# Patient Record
Sex: Female | Born: 1975 | Race: White | Hispanic: No | Smoking: Former smoker
Health system: Southern US, Community
[De-identification: ages and names within clinical notes are randomized; demographics above are authoritative.]

## PROBLEM LIST (undated history)

## (undated) DIAGNOSIS — S82899A Other fracture of unspecified lower leg, initial encounter for closed fracture: Secondary | ICD-10-CM

## (undated) DIAGNOSIS — R51 Headache: Secondary | ICD-10-CM

## (undated) DIAGNOSIS — N189 Chronic kidney disease, unspecified: Secondary | ICD-10-CM

## (undated) DIAGNOSIS — R87629 Unspecified abnormal cytological findings in specimens from vagina: Secondary | ICD-10-CM

## (undated) DIAGNOSIS — J302 Other seasonal allergic rhinitis: Secondary | ICD-10-CM

## (undated) DIAGNOSIS — F419 Anxiety disorder, unspecified: Secondary | ICD-10-CM

## (undated) DIAGNOSIS — F329 Major depressive disorder, single episode, unspecified: Secondary | ICD-10-CM

## (undated) DIAGNOSIS — K760 Fatty (change of) liver, not elsewhere classified: Secondary | ICD-10-CM

## (undated) DIAGNOSIS — M779 Enthesopathy, unspecified: Secondary | ICD-10-CM

## (undated) DIAGNOSIS — R519 Headache, unspecified: Secondary | ICD-10-CM

## (undated) DIAGNOSIS — F32A Depression, unspecified: Secondary | ICD-10-CM

## (undated) DIAGNOSIS — K259 Gastric ulcer, unspecified as acute or chronic, without hemorrhage or perforation: Secondary | ICD-10-CM

## (undated) HISTORY — DX: Other fracture of unspecified lower leg, initial encounter for closed fracture: S82.899A

## (undated) HISTORY — PX: LASER ABLATION/CAUTERIZATION OF ENDOMETRIAL IMPLANTS: SHX1951

## (undated) HISTORY — PX: OTHER SURGICAL HISTORY: SHX169

## (undated) HISTORY — DX: Gastric ulcer, unspecified as acute or chronic, without hemorrhage or perforation: K25.9

## (undated) HISTORY — DX: Other seasonal allergic rhinitis: J30.2

## (undated) HISTORY — PX: WISDOM TOOTH EXTRACTION: SHX21

## (undated) HISTORY — DX: Unspecified abnormal cytological findings in specimens from vagina: R87.629

## (undated) HISTORY — DX: Fatty (change of) liver, not elsewhere classified: K76.0

---

## 1998-06-06 ENCOUNTER — Emergency Department (HOSPITAL_COMMUNITY): Admission: EM | Admit: 1998-06-06 | Discharge: 1998-06-07 | Payer: Self-pay | Admitting: Internal Medicine

## 1998-08-27 ENCOUNTER — Emergency Department (HOSPITAL_COMMUNITY): Admission: EM | Admit: 1998-08-27 | Discharge: 1998-08-27 | Payer: Self-pay | Admitting: Emergency Medicine

## 1998-08-27 ENCOUNTER — Encounter: Payer: Self-pay | Admitting: *Deleted

## 1998-11-13 ENCOUNTER — Other Ambulatory Visit: Admission: RE | Admit: 1998-11-13 | Discharge: 1998-11-13 | Payer: Self-pay | Admitting: Obstetrics and Gynecology

## 1999-02-26 ENCOUNTER — Inpatient Hospital Stay (HOSPITAL_COMMUNITY): Admission: AD | Admit: 1999-02-26 | Discharge: 1999-02-26 | Payer: Self-pay | Admitting: Obstetrics and Gynecology

## 1999-03-25 ENCOUNTER — Inpatient Hospital Stay (HOSPITAL_COMMUNITY): Admission: AD | Admit: 1999-03-25 | Discharge: 1999-03-25 | Payer: Self-pay | Admitting: Obstetrics and Gynecology

## 1999-04-13 ENCOUNTER — Inpatient Hospital Stay (HOSPITAL_COMMUNITY): Admission: AD | Admit: 1999-04-13 | Discharge: 1999-04-13 | Payer: Self-pay | Admitting: Obstetrics and Gynecology

## 1999-04-13 ENCOUNTER — Encounter: Payer: Self-pay | Admitting: Obstetrics and Gynecology

## 2015-04-23 ENCOUNTER — Ambulatory Visit: Payer: BC Managed Care – PPO | Attending: Orthopedic Surgery | Admitting: Physical Therapy

## 2015-04-23 DIAGNOSIS — M25672 Stiffness of left ankle, not elsewhere classified: Secondary | ICD-10-CM | POA: Diagnosis present

## 2015-04-23 DIAGNOSIS — M25572 Pain in left ankle and joints of left foot: Secondary | ICD-10-CM | POA: Insufficient documentation

## 2015-04-23 NOTE — Therapy (Signed)
[facility] [place], [place], [address] Phone: [phone]   Fax:  [phone]  Physical Therapy Evaluation  Patient Details  Name: Courtney Joseph MRN: [id number] Date of Birth: 1975-11-22 Referring Provider: Wylene Simmer MD  Encounter Date: 04/23/2015      PT End of Session - 04/23/15 1223    Visit Number 1   Number of Visits 12   Date for PT Re-Evaluation 06/04/15   PT Start Time 5009   PT Stop Time 1214   PT Time Calculation (min) 58 min   Activity Tolerance Patient tolerated treatment well   Behavior During Therapy [facility] for tasks assessed/performed      No past medical history on file.  No past surgical history on file.  There were no vitals filed for this visit.  Visit Diagnosis:  Pain in left ankle and joints of left foot - Plan: PT plan of care cert/re-cert  Stiffness of left ankle, not elsewhere classified - Plan: PT plan of care cert/re-cert      Subjective Assessment - 04/23/15 1225    Subjective The pain wakes me at night.   Limitations Walking   How long can you walk comfortably? Short distances.   Patient Stated Goals Get out of pain and be able to workout again.   Currently in Pain? Yes   Pain Score 8    Pain Location Heel   Pain Orientation Left   Pain Descriptors / Indicators Aching;Throbbing   Pain Type Chronic pain;Acute pain   Pain Onset 1 to 4 weeks ago   Pain Frequency Constant   Aggravating Factors  Walking/running.   Pain Relieving Factors Ice pack.            [facility] PT Assessment - 04/23/15 0001    Assessment   Medical Diagnosis Left Achilles Tendonitis.   Referring Provider Wylene Simmer MD   Onset Date/Surgical Date --  One month.   Precautions   Precautions None   Restrictions   Weight Bearing Restrictions No   Balance Screen   Has the patient fallen in the past 6 months No   Has the patient had a decrease in activity level because of a fear of falling?  No   Is the  patient reluctant to leave their home because of a fear of falling?  No   Home Environment   Living Environment Private residence   Prior Function   Level of Independence Independent   Observation/Other Assessments-Edema    Edema --  Some malleolar swelling (mild).   ROM / Strength   AROM / PROM / Strength AROM;Strength   AROM   Overall AROM Comments Active left ankle dorsiflexion limited to 2 degrees with knee in full extension and 5 degrees with left knee flexion.   Strength   Overall Strength Comments Normal left foot and ankle strength.   Palpation   Palpation comment Tender to palpation over left calf from mid-calf to the calcaneal attachment of the left Achilles tendon diffusely over the rim of the calcaneus and tender to palpation on plantar aspect of foot (mid heel).   Ambulation/Gait   Gait Comments Antalgic gait with decrease stance time over patient's left heel.                   [facility] Adult PT Treatment/Exercise - 04/23/15 0001    Modalities   Modalities Electrical Stimulation;Iontophoresis   Electrical Stimulation   Electrical Stimulation Location Left heel (plantar surface) and distal calf/Achilles (left).  Electrical Stimulation Action Pre-mod constant   Electrical Stimulation Parameters 80-150 Hz.   Electrical Stimulation Goals Pain   Iontophoresis   Type of Iontophoresis Dexamethasone   Location Left affected Achilles.   Dose 80 mA-minutes   Time Patch                     PT Long Term Goals - 04/23/15 1237    PT LONG TERM GOAL #1   Title Ind with a HEP.   Time 4   Period Weeks   Status New   PT LONG TERM GOAL #2   Title Increase left ankle dorsiflexion to 8 degrees with left knee in full extension.   Time 4   Period Weeks   Status New   PT LONG TERM GOAL #3   Title Stand 20 minutes with pain not > 2/10.   Time 4   Period Weeks   Status New   PT LONG TERM GOAL #4   Title Sleep undistubed by pain.   Time 4   Period Weeks    Status New   PT LONG TERM GOAL #5   Title Perform ADL's with pain not > 2/10.   Time 4   Period Weeks   Status New               Plan - 04/23/15 1227    Clinical Impression Statement The patient reports she broke her foot in May of 2016 and was casted and then put in a CAM walker/boot.  She states she was doing well until about a month ago when she started feeling left calf and Achilles pain and now pain on the bottom of her left heel.  Her pain at times is so bad (8+/10) that it wakes her at night.  Pain increases with walking and  it makes perfoming ADL's more difficult.   Pt will benefit from skilled therapeutic intervention in order to improve on the following deficits Abnormal gait;Decreased activity tolerance;Pain;Decreased range of motion   Rehab Potential Excellent   PT Frequency 3x / week   PT Duration 4 weeks   PT Treatment/Interventions Electrical Stimulation;Cryotherapy;Moist Heat;Therapeutic exercise;Therapeutic activities;Ultrasound;Manual techniques;Passive range of motion   PT Next Visit Plan Iontophoresis (patient brought in her own prescription).  In prone:  STW/M..IASTM to patinet's left calf/Achilles and left heel.  Combo e'stim/U/S.   Consulted and Agree with Plan of Care Patient         Problem List There are no active problems to display for this patient.   APPLEGATE, [place] MPT 04/23/2015, 12:44 PM  [facility] [address] [place], [place], [address] Phone: [phone]   Fax:  [phone]  Name: Courtney Joseph MRN: [id number] Date of Birth: [birth year]

## 2015-04-25 ENCOUNTER — Ambulatory Visit: Payer: BC Managed Care – PPO | Admitting: *Deleted

## 2015-04-25 DIAGNOSIS — M25672 Stiffness of left ankle, not elsewhere classified: Secondary | ICD-10-CM

## 2015-04-25 DIAGNOSIS — M25572 Pain in left ankle and joints of left foot: Secondary | ICD-10-CM | POA: Diagnosis not present

## 2015-04-25 NOTE — Therapy (Signed)
[facility] [place], [place], [address] Phone: [phone]   Fax:  [phone]  Physical Therapy Treatment  Patient Details  Name: Courtney Joseph MRN: [id number] Date of Birth: 03-20-1975 Referring Provider: Wylene Simmer MD  Encounter Date: 04/25/2015      PT End of Session - 04/25/15 0904    Visit Number 2   Number of Visits 12   Date for PT Re-Evaluation 06/04/15   PT Start Time 0815   PT Stop Time 0908   PT Time Calculation (min) 53 min      No past medical history on file.  No past surgical history on file.  There were no vitals filed for this visit.      Subjective Assessment - 04/25/15 0857    Subjective The pain wakes me at night. Did good after last Rx   Limitations Walking   How long can you walk comfortably? Short distances.   Patient Stated Goals Get out of pain and be able to workout again.   Currently in Pain? Yes   Pain Score 7    Pain Location Heel   Pain Orientation Left   Pain Descriptors / Indicators Aching;Throbbing   Pain Type Chronic pain;Acute pain   Pain Onset 1 to 4 weeks ago   Pain Frequency Constant                         [facility] Adult PT Treatment/Exercise - 04/25/15 0001    Exercises   Exercises Ankle   Modalities   Modalities Electrical Stimulation;Iontophoresis;Moist Heat   Moist Heat Therapy   Number Minutes Moist Heat 15 Minutes   Moist Heat Location Ankle   Electrical Stimulation   Electrical Stimulation Location Left heel (plantar surface) and distal calf/Achilles (left). Premod x 15 mins 80-150hz    Electrical Stimulation Goals Pain   Iontophoresis   Type of Iontophoresis Dexamethasone   Location Left affected Achilles.   Dose 80 mA-minutes   Time Patch   Manual Therapy   Manual Therapy Soft tissue mobilization;Myofascial release   Myofascial Release STW/ IASTM to entire calf and into achilles while on/off stretch in prone   Ankle Exercises:  Standing   Rocker Board 3 minutes  calf stretching                     PT Long Term Goals - 04/23/15 1237    PT LONG TERM GOAL #1   Title Ind with a HEP.   Time 4   Period Weeks   Status New   PT LONG TERM GOAL #2   Title Increase left ankle dorsiflexion to 8 degrees with left knee in full extension.   Time 4   Period Weeks   Status New   PT LONG TERM GOAL #3   Title Stand 20 minutes with pain not > 2/10.   Time 4   Period Weeks   Status New   PT LONG TERM GOAL #4   Title Sleep undistubed by pain.   Time 4   Period Weeks   Status New   PT LONG TERM GOAL #5   Title Perform ADL's with pain not > 2/10.   Time 4   Period Weeks   Status New               Plan - 04/25/15 1243    Clinical Impression Statement Pt did fairly well with Rx today. She had notable tightness in  LT calf and into achilles tendon. She did fairly well with STW/IASTM with min to mod pressure. She also did well with light stretching on rocker board. Goals are ongoing   Rehab Potential Excellent   PT Frequency 3x / week   PT Duration 4 weeks   PT Treatment/Interventions Electrical Stimulation;Cryotherapy;Moist Heat;Therapeutic exercise;Therapeutic activities;Ultrasound;Manual techniques;Passive range of motion   PT Next Visit Plan Iontophoresis (patient brought in her own prescription).  In prone:  STW/M..IASTM to patinet's left calf/Achilles and left heel.  Combo e'stim/U/S.   Consulted and Agree with Plan of Care Patient      Patient will benefit from skilled therapeutic intervention in order to improve the following deficits and impairments:  Abnormal gait, Decreased activity tolerance, Pain, Decreased range of motion  Visit Diagnosis: Pain in left ankle and joints of left foot  Stiffness of left ankle, not elsewhere classified     Problem List There are no active problems to display for this patient.   Ivi Griffith,CHRIS, PTA 04/25/2015, 12:53 PM  [facility] [address] [place], [place], [address] Phone: [phone]   Fax:  [phone]  Name: Makalya Nave MRN: [id number] Date of Birth: February 26, 1975

## 2015-04-28 ENCOUNTER — Ambulatory Visit: Payer: BC Managed Care – PPO | Admitting: Physical Therapy

## 2015-04-28 DIAGNOSIS — M25572 Pain in left ankle and joints of left foot: Secondary | ICD-10-CM

## 2015-04-28 DIAGNOSIS — M25672 Stiffness of left ankle, not elsewhere classified: Secondary | ICD-10-CM

## 2015-04-28 NOTE — Therapy (Signed)
[facility] [place], [place], [address] Phone: [phone]   Fax:  [phone]  Physical Therapy Treatment  Patient Details  Name: Courtney Joseph MRN: [id number] Date of Birth: 1975-02-05 Referring Provider: Wylene Simmer MD  Encounter Date: 04/28/2015      PT End of Session - 04/28/15 0824    Visit Number 3   Number of Visits 12   Date for PT Re-Evaluation 06/04/15   PT Start Time 0818   PT Stop Time 0902   PT Time Calculation (min) 44 min   Activity Tolerance Patient tolerated treatment well   Behavior During Therapy [facility] for tasks assessed/performed      No past medical history on file.  No past surgical history on file.  There were no vitals filed for this visit.      Subjective Assessment - 04/28/15 0822    Subjective States that her L foot has been hurting her and kept for up last night. States that Achilles tendon discomfort is better although it is still tender.   Limitations Walking   How long can you walk comfortably? Short distances.   Patient Stated Goals Get out of pain and be able to workout again.   Currently in Pain? Yes   Pain Score 6    Pain Location Foot   Pain Orientation Left   Pain Descriptors / Indicators Nagging   Pain Type Chronic pain   Pain Onset 1 to 4 weeks ago            [facility] PT Assessment - 04/28/15 0001    Assessment   Medical Diagnosis Left Achilles Tendonitis.   Next MD Visit 05/18/2015   Precautions   Precautions None   Restrictions   Weight Bearing Restrictions No                     [facility] Adult PT Treatment/Exercise - 04/28/15 0001    Modalities   Modalities Electrical Stimulation;Iontophoresis;Moist Heat   Moist Heat Therapy   Number Minutes Moist Heat 15 Minutes   Moist Heat Location Ankle   Electrical Stimulation   Electrical Stimulation Location L plantar surface of foot, L distal Calf   Electrical Stimulation Action Pre-Mod   Electrical  Stimulation Parameters 80-150 Hz x15 min   Electrical Stimulation Goals Pain   Iontophoresis   Type of Iontophoresis Dexamethasone   Location Left affected Achilles.   Dose 80 mA-minutes   Time Patch   Manual Therapy   Manual Therapy Myofascial release   Myofascial Release IASTW to L Achilles, Gastroc in prone with intermittant calf stretching during IASTW   Ankle Exercises: Standing   Rocker Board Other (comment)  x6 min                     PT Long Term Goals - 04/28/15 0845    PT LONG TERM GOAL #1   Title Ind with a HEP.   Time 4   Period Weeks   Status Achieved   PT LONG TERM GOAL #2   Title Increase left ankle dorsiflexion to 8 degrees with left knee in full extension.   Time 4   Period Weeks   Status On-going   PT LONG TERM GOAL #3   Title Stand 20 minutes with pain not > 2/10.   Time 4   Period Weeks   Status On-going   PT LONG TERM GOAL #4   Title Sleep undistubed by pain.   Time  4   Period Weeks   Status On-going   PT LONG TERM GOAL #5   Title Perform ADL's with pain not > 2/10.   Time 4   Period Weeks   Status On-going               Plan - 04/28/15 0846    Clinical Impression Statement Patient tolerated today's treatment fairly well as she arrived with increased pain as she had increased pain last night and could hardly sleep. Patient only stands for 5/10 minutes at a time and has increased pain with walking per patient report. Tolerated IASTW to L calf and Achilles tendon fairly well as most discomfort was in mid Achilles to L heel. Achieved HEP goal that she had been told in previous treatment but all other goals are on-going secondary to only being third treatment. Normal modalities response noted following removal of the modalities. Iontophoresis patch was again placed over the L Achilles tendon and patient noted that L foot felt " good" following today's treatment. Paitent was encouraged to continue stretching that she was educated on in  previous treatment.   Rehab Potential Excellent   PT Frequency 3x / week   PT Duration 4 weeks   PT Treatment/Interventions Electrical Stimulation;Cryotherapy;Moist Heat;Therapeutic exercise;Therapeutic activities;Ultrasound;Manual techniques;Passive range of motion   PT Next Visit Plan IASTW to L Achilles and calf in prone with modalities and iontophoresis for pain per MPT POC.   Consulted and Agree with Plan of Care Patient      Patient will benefit from skilled therapeutic intervention in order to improve the following deficits and impairments:  Abnormal gait, Decreased activity tolerance, Pain, Decreased range of motion  Visit Diagnosis: Pain in left ankle and joints of left foot  Stiffness of left ankle, not elsewhere classified     Problem List There are no active problems to display for this patient.   Wynelle Fanny, PTA 04/28/2015, 9:11 AM  [facility] [address] [place], [place], [address] Phone: [phone]   Fax:  [phone]  Name: Courtney Joseph MRN: [id number] Date of Birth: Jul 11, 1975

## 2015-04-30 ENCOUNTER — Encounter: Payer: Self-pay | Admitting: Physical Therapy

## 2015-04-30 ENCOUNTER — Ambulatory Visit: Payer: BC Managed Care – PPO | Admitting: Physical Therapy

## 2015-04-30 DIAGNOSIS — M25572 Pain in left ankle and joints of left foot: Secondary | ICD-10-CM

## 2015-04-30 DIAGNOSIS — M25672 Stiffness of left ankle, not elsewhere classified: Secondary | ICD-10-CM

## 2015-04-30 NOTE — Therapy (Signed)
[facility] [place], [place], [address] Phone: [phone]   Fax:  [phone]  Physical Therapy Treatment  Patient Details  Name: Courtney Joseph MRN: [id number] Date of Birth: [birth year] Referring Provider: Wylene Simmer MD  Encounter Date: 04/30/2015      PT End of Session - 04/30/15 1347    Visit Number 4   Number of Visits 12   Date for PT Re-Evaluation 06/04/15   PT Start Time 1314   PT Stop Time 1400   PT Time Calculation (min) 46 min   Activity Tolerance Patient tolerated treatment well   Behavior During Therapy [facility] for tasks assessed/performed      History reviewed. No pertinent past medical history.  History reviewed. No pertinent past surgical history.  There were no vitals filed for this visit.      Subjective Assessment - 04/30/15 1318    Subjective No complaints after last treatment   Limitations Walking   How long can you walk comfortably? Short distances.   Patient Stated Goals Get out of pain and be able to workout again.   Currently in Pain? Yes   Pain Score 3    Pain Location --  achilles   Pain Orientation Left   Pain Descriptors / Indicators Aching;Nagging   Pain Type Chronic pain   Pain Onset 1 to 4 weeks ago   Pain Frequency Intermittent   Aggravating Factors  walking or standing   Pain Relieving Factors heat or ice                         [facility] Adult PT Treatment/Exercise - 04/30/15 0001    Moist Heat Therapy   Number Minutes Moist Heat 15 Minutes   Moist Heat Location Ankle   Electrical Stimulation   Electrical Stimulation Location L plantar surface of foot, L distal Calf   Electrical Stimulation Action premod   Electrical Stimulation Parameters 80-150hz    Electrical Stimulation Goals Pain   Iontophoresis   Type of Iontophoresis Dexamethasone   Location Left affected Achilles.   Dose 80 mA-minutes 4 of 6   Time Patch   Manual Therapy   Manual Therapy  Myofascial release   Myofascial Release IASTW to L Achilles, Gastroc in prone with intermittant calf stretching during IASTW   Ankle Exercises: Standing   Rocker Board --  74mn   Other Standing Ankle Exercises eccentic lowering on ground then 4" step 2x10                     PT Long Term Goals - 04/28/15 0845    PT LONG TERM GOAL #1   Title Ind with a HEP.   Time 4   Period Weeks   Status Achieved   PT LONG TERM GOAL #2   Title Increase left ankle dorsiflexion to 8 degrees with left knee in full extension.   Time 4   Period Weeks   Status On-going   PT LONG TERM GOAL #3   Title Stand 20 minutes with pain not > 2/10.   Time 4   Period Weeks   Status On-going   PT LONG TERM GOAL #4   Title Sleep undistubed by pain.   Time 4   Period Weeks   Status On-going   PT LONG TERM GOAL #5   Title Perform ADL's with pain not > 2/10.   Time 4   Period Weeks   Status On-going  Plan - 04/30/15 1349    Clinical Impression Statement Patient progressing with good response to treatment thus far. Patient has no complaints after treatments and feeling better overall. Patient unable to meet any further goals due to pain with prolong standing or walking.    Rehab Potential Excellent   PT Frequency 3x / week   PT Duration 4 weeks   PT Treatment/Interventions Electrical Stimulation;Cryotherapy;Moist Heat;Therapeutic exercise;Therapeutic activities;Ultrasound;Manual techniques;Passive range of motion   PT Next Visit Plan IASTW to L Achilles and calf in prone with modalities and iontophoresis for pain per MPT POC.   Consulted and Agree with Plan of Care Patient      Patient will benefit from skilled therapeutic intervention in order to improve the following deficits and impairments:  Abnormal gait, Decreased activity tolerance, Pain, Decreased range of motion  Visit Diagnosis: Pain in left ankle and joints of left foot  Stiffness of left ankle, not elsewhere  classified     Problem List There are no active problems to display for this patient.   Phillips Climes, PTA 04/30/2015, 2:01 PM  [facility] [place], [place], [address] Phone: [phone]   Fax:  [phone]  Name: Denee Boeder MRN: [id number] Date of Birth: 10-Apr-1975

## 2015-05-05 ENCOUNTER — Encounter: Payer: Self-pay | Admitting: Physical Therapy

## 2015-05-05 ENCOUNTER — Ambulatory Visit: Payer: BC Managed Care – PPO | Admitting: Physical Therapy

## 2015-05-05 DIAGNOSIS — M25672 Stiffness of left ankle, not elsewhere classified: Secondary | ICD-10-CM

## 2015-05-05 DIAGNOSIS — M25572 Pain in left ankle and joints of left foot: Secondary | ICD-10-CM | POA: Diagnosis not present

## 2015-05-05 NOTE — Therapy (Signed)
[facility] [place], [place], [address] Phone: [phone]   Fax:  [phone]  Physical Therapy Treatment  Patient Details  Name: Courtney Joseph MRN: [id number] Date of Birth: Oct 22, 1975 Referring Provider: Wylene Simmer MD  Encounter Date: 05/05/2015      PT End of Session - 05/05/15 1113    Visit Number 5   Number of Visits 12   Date for PT Re-Evaluation 06/04/15   PT Start Time 1030   PT Stop Time 1131   PT Time Calculation (min) 61 min   Activity Tolerance Patient tolerated treatment well   Behavior During Therapy [facility] for tasks assessed/performed      History reviewed. No pertinent past medical history.  History reviewed. No pertinent past surgical history.  There were no vitals filed for this visit.      Subjective Assessment - 05/05/15 1035    Subjective No complaints after last treatment, some stiffness after a lot of standing over weekend   Limitations Walking   How long can you walk comfortably? Short distances.   Patient Stated Goals Get out of pain and be able to workout again.   Currently in Pain? Yes   Pain Score 2    Pain Location --  achilles   Pain Orientation Left   Pain Descriptors / Indicators Aching;Nagging   Pain Type Chronic pain   Pain Onset 1 to 4 weeks ago   Pain Frequency Intermittent   Aggravating Factors  prolong standing or walking   Pain Relieving Factors heat and or ice                         [facility] Adult PT Treatment/Exercise - 05/05/15 0001    Modalities   Modalities Vasopneumatic;Ultrasound   Electrical Stimulation   Electrical Stimulation Location L plantar surface of foot, L distal Calf   Electrical Stimulation Action premod   Electrical Stimulation Parameters 80-150hz    Electrical Stimulation Goals Pain   Ultrasound   Ultrasound Location achilles   Ultrasound Parameters 1.5w/cm2/50%/3.56mzx8min   Ultrasound Goals Pain   Iontophoresis   Type of  Iontophoresis Dexamethasone   Location Left affected Achilles.   Dose 80 mA-minutes 5 of 6   Time Patch   Vasopneumatic   Number Minutes Vasopneumatic  15 minutes   Vasopnuematic Location  Ankle   Vasopneumatic Pressure Medium   Manual Therapy   Manual Therapy Myofascial release   Myofascial Release IASTW to L Achilles  in prone with intermittant calf stretching during IASTW   Ankle Exercises: Standing   Rocker Board --  557m   Other Standing Ankle Exercises eccentic lowering on ground then step 3x10                     PT Long Term Goals - 05/05/15 1050    PT LONG TERM GOAL #1   Title Ind with a HEP.   Time 4   Period Weeks   Status Achieved   PT LONG TERM GOAL #2   Title Increase left ankle dorsiflexion to 8 degrees with left knee in full extension.   Time 4   Period Weeks   Status On-going   PT LONG TERM GOAL #3   Title Stand 20 minutes with pain not > 2/10.   Time 4   Period Weeks   Status Achieved  2/10 05/05/15   PT LONG TERM GOAL #4   Title Sleep undistubed by pain.  Time 4   Period Weeks   Status Achieved  sleep with no pain 05/05/15   PT LONG TERM GOAL #5   Title Perform ADL's with pain not > 2/10.   Time 4   Period Weeks   Status On-going  2-3/10 05/05/15               Plan - 05/05/15 1056    Clinical Impression Statement Patient continues to progress with good response to therapy and feels less pain overall. Patient able to sleep undisturbed /no pain. patient has only 2/10 pain with standing 20 min. Patient has about 2-3/10 with ADL's. Patient able to meet LTG #3 and #4 today. Other goals ongoing due to pain and ROM deficits.   Rehab Potential Excellent   PT Frequency 3x / week   PT Duration 4 weeks   PT Treatment/Interventions Electrical Stimulation;Cryotherapy;Moist Heat;Therapeutic exercise;Therapeutic activities;Ultrasound;Manual techniques;Passive range of motion   PT Next Visit Plan IASTW to L Achilles and calf in prone with  modalities and iontophoresis for pain per MPT POC.   Consulted and Agree with Plan of Care Patient      Patient will benefit from skilled therapeutic intervention in order to improve the following deficits and impairments:  Abnormal gait, Decreased activity tolerance, Pain, Decreased range of motion  Visit Diagnosis: Pain in left ankle and joints of left foot  Stiffness of left ankle, not elsewhere classified     Problem List There are no active problems to display for this patient.   Phillips Climes, PTA 05/05/2015, 11:30 AM  [facility] [place], [place], [address] Phone: [phone]   Fax:  [phone]  Name: Courtney Joseph MRN: [id number] Date of Birth: [birth year]

## 2015-05-08 ENCOUNTER — Ambulatory Visit: Payer: BC Managed Care – PPO | Admitting: Physical Therapy

## 2015-05-08 DIAGNOSIS — M25672 Stiffness of left ankle, not elsewhere classified: Secondary | ICD-10-CM

## 2015-05-08 DIAGNOSIS — M25572 Pain in left ankle and joints of left foot: Secondary | ICD-10-CM | POA: Diagnosis not present

## 2015-05-08 NOTE — Therapy (Signed)
[facility] [place], [place], [address] Phone: [phone]   Fax:  [phone]  Physical Therapy Treatment  Patient Details  Name: Courtney Joseph MRN: [id number] Date of Birth: July 12, 1975 Referring Provider: Wylene Simmer MD  Encounter Date: 05/08/2015      PT End of Session - 05/08/15 1501    Visit Number 6   Number of Visits 12   Date for PT Re-Evaluation 06/04/15   PT Start Time 0150   PT Stop Time 0235   PT Time Calculation (min) 45 min   Activity Tolerance Patient tolerated treatment well   Behavior During Therapy [facility] for tasks assessed/performed      No past medical history on file.  No past surgical history on file.  There were no vitals filed for this visit.      Subjective Assessment - 05/08/15 1448    Subjective I'm sleeping better due to less pain.   Limitations Walking   How long can you walk comfortably? Short distances.   Patient Stated Goals Get out of pain and be able to workout again.   Pain Score 3    Pain Orientation Left   Pain Descriptors / Indicators Aching;Nagging                         [facility] Adult PT Treatment/Exercise - 05/08/15 0001    Ultrasound   Ultrasound Location In prone:  Left affected Achilles and plantar fascia   Ultrasound Parameters 1.50 W/CM2 at 3.3 MHz at 50% x 14 minutes.   Ultrasound Goals Pain   Manual Therapy   Manual Therapy Soft tissue mobilization   Myofascial Release IASTM/STW/M x 24 minutes.                     PT Long Term Goals - 05/05/15 1050    PT LONG TERM GOAL #1   Title Ind with a HEP.   Time 4   Period Weeks   Status Achieved   PT LONG TERM GOAL #2   Title Increase left ankle dorsiflexion to 8 degrees with left knee in full extension.   Time 4   Period Weeks   Status On-going   PT LONG TERM GOAL #3   Title Stand 20 minutes with pain not > 2/10.   Time 4   Period Weeks   Status Achieved  2/10 05/05/15   PT  LONG TERM GOAL #4   Title Sleep undistubed by pain.   Time 4   Period Weeks   Status Achieved  sleep with no pain 05/05/15   PT LONG TERM GOAL #5   Title Perform ADL's with pain not > 2/10.   Time 4   Period Weeks   Status On-going  2-3/10 05/05/15             Patient will benefit from skilled therapeutic intervention in order to improve the following deficits and impairments:     Visit Diagnosis: Pain in left ankle and joints of left foot  Stiffness of left ankle, not elsewhere classified     Problem List There are no active problems to display for this patient.   Ovie Cornelio, [place] MPT 05/08/2015, 3:16 PM  [facility] [address] [place], [place], [address] Phone: [phone]   Fax:  [phone]  Name: Courtney Joseph MRN: [id number] Date of Birth: 1975/10/30

## 2015-05-13 ENCOUNTER — Ambulatory Visit: Payer: BC Managed Care – PPO | Admitting: *Deleted

## 2015-05-13 DIAGNOSIS — M25572 Pain in left ankle and joints of left foot: Secondary | ICD-10-CM | POA: Diagnosis not present

## 2015-05-13 DIAGNOSIS — M25672 Stiffness of left ankle, not elsewhere classified: Secondary | ICD-10-CM

## 2015-05-13 NOTE — Therapy (Signed)
[facility] [place], [place], [address] Phone: [phone]   Fax:  [phone]  Physical Therapy Treatment  Patient Details  Name: Courtney Joseph MRN: [id number] Date of Birth: [birth year] Referring Provider: Wylene Simmer MD  Encounter Date: 05/13/2015      PT End of Session - 05/13/15 1824    Visit Number 7   Number of Visits 12   Date for PT Re-Evaluation 06/04/15   PT Start Time 1771   PT Stop Time 1607   PT Time Calculation (min) 52 min      No past medical history on file.  No past surgical history on file.  There were no vitals filed for this visit.      Subjective Assessment - 05/13/15 1826    Subjective I'm sleeping better due to less pain.  Note:  Mosquito bites on calf of leg. Heel hurt more yeterday   Limitations Walking   How long can you walk comfortably? Short distances.   Patient Stated Goals Get out of pain and be able to workout again.   Currently in Pain? Yes   Pain Score 4    Pain Location Heel   Pain Orientation Left   Pain Descriptors / Indicators Aching;Sore   Pain Type Chronic pain   Pain Onset 1 to 4 weeks ago   Pain Frequency Intermittent                         [facility] Adult PT Treatment/Exercise - 05/13/15 0001    Modalities   Modalities Vasopneumatic;Ultrasound   Moist Heat Therapy   Number Minutes Moist Heat 15 Minutes   Moist Heat Location Ankle   Electrical Stimulation   Electrical Stimulation Location L plantar surface of foot, L distal Calf premod x 15 mins 80-150 hz   Electrical Stimulation Goals Pain   Ultrasound   Ultrasound Location LT heel in prone    Ultrasound Parameters 1.5 w/cm2 x 10 mins at 50%   Ultrasound Goals Pain   Iontophoresis   Type of Iontophoresis Dexamethasone   Location Left affected Achilles.   Dose 80 mA-minutes 6of 6   Time Patch   Manual Therapy   Manual Therapy Soft tissue mobilization   Myofascial Release IASTW to L  Achilles, Gastroc in prone with intermittant calf stretching during IASTW and STW to platnar fascia                     PT Long Term Goals - 05/05/15 1050    PT LONG TERM GOAL #1   Title Ind with a HEP.   Time 4   Period Weeks   Status Achieved   PT LONG TERM GOAL #2   Title Increase left ankle dorsiflexion to 8 degrees with left knee in full extension.   Time 4   Period Weeks   Status On-going   PT LONG TERM GOAL #3   Title Stand 20 minutes with pain not > 2/10.   Time 4   Period Weeks   Status Achieved  2/10 05/05/15   PT LONG TERM GOAL #4   Title Sleep undistubed by pain.   Time 4   Period Weeks   Status Achieved  sleep with no pain 05/05/15   PT LONG TERM GOAL #5   Title Perform ADL's with pain not > 2/10.   Time 4   Period Weeks   Status On-going  2-3/10 05/05/15  Plan - 05/13/15 1831    Clinical Impression Statement Pt did fairly well with Rx today and was able to tolerate STW to achilles and calf with minimal pain/ soreness. She still had notable tightness in plantar fascia, but painb was still low. Pt continues to improve. She had increased heel pain yesterday, but not as bad today.  Goals are ongoing   Rehab Potential Excellent   PT Frequency 3x / week   PT Duration 4 weeks   PT Treatment/Interventions Electrical Stimulation;Cryotherapy;Moist Heat;Therapeutic exercise;Therapeutic activities;Ultrasound;Manual techniques;Passive range of motion   PT Next Visit Plan IASTW to L Achilles and calf in prone with modalities  for pain per MPT POC.   Consulted and Agree with Plan of Care Patient      Patient will benefit from skilled therapeutic intervention in order to improve the following deficits and impairments:  Abnormal gait, Decreased activity tolerance, Pain, Decreased range of motion  Visit Diagnosis: Pain in left ankle and joints of left foot  Stiffness of left ankle, not elsewhere classified     Problem List There are  no active problems to display for this patient.   Grant Henkes,CHRIS, PTA 05/13/2015, 6:38 PM  [facility] [address] [place], [place], [address] Phone: [phone]   Fax:  [phone]  Name: Cici Rodriges MRN: [id number] Date of Birth: 07-10-1975

## 2015-05-15 ENCOUNTER — Ambulatory Visit: Payer: BC Managed Care – PPO | Admitting: *Deleted

## 2015-05-15 DIAGNOSIS — M25572 Pain in left ankle and joints of left foot: Secondary | ICD-10-CM

## 2015-05-15 DIAGNOSIS — M25672 Stiffness of left ankle, not elsewhere classified: Secondary | ICD-10-CM

## 2015-05-15 NOTE — Therapy (Signed)
[facility] [place], [place], [address] Phone: [phone]   Fax:  [phone]  Physical Therapy Treatment  Patient Details  Name: Courtney Joseph MRN: [id number] Date of Birth: [birth year] Referring Provider: Wylene Simmer MD  Encounter Date: 05/15/2015      PT End of Session - 05/15/15 1516    Visit Number 8   Number of Visits 12   Date for PT Re-Evaluation 06/04/15   PT Start Time 1510   PT Stop Time 1600   PT Time Calculation (min) 50 min      No past medical history on file.  No past surgical history on file.  There were no vitals filed for this visit.      Subjective Assessment - 05/15/15 1511    Subjective LT foot is  feeling better 2/10 today on bottom of heel   Limitations Walking   How long can you walk comfortably? Short distances.   Patient Stated Goals Get out of pain and be able to workout again.   Currently in Pain? Yes   Pain Score 2    Pain Location Heel   Pain Orientation Left   Pain Descriptors / Indicators Aching;Sore   Pain Type Chronic pain   Pain Onset 1 to 4 weeks ago   Pain Frequency Intermittent                         [facility] Adult PT Treatment/Exercise - 05/15/15 0001    Modalities   Modalities Moist Heat;Ultrasound   Moist Heat Therapy   Number Minutes Moist Heat 15 Minutes   Moist Heat Location Ankle   Electrical Stimulation   Electrical Stimulation Location L plantar surface of foot, L distal Calf premod x 15 mins 80-150 hz   Electrical Stimulation Goals Pain   Ultrasound   Ultrasound Location LT heel bottom part   Ultrasound Parameters 1.5 w/cm2 x 10 mins   Ultrasound Goals Pain   Manual Therapy   Manual Therapy Soft tissue mobilization   Myofascial Release IASTW and STW to LT plantar fascia and around heel with Pt supine                     PT Long Term Goals - 05/05/15 1050    PT LONG TERM GOAL #1   Title Ind with a HEP.   Time 4   Period Weeks   Status Achieved   PT LONG TERM GOAL #2   Title Increase left ankle dorsiflexion to 8 degrees with left knee in full extension.   Time 4   Period Weeks   Status On-going   PT LONG TERM GOAL #3   Title Stand 20 minutes with pain not > 2/10.   Time 4   Period Weeks   Status Achieved  2/10 05/05/15   PT LONG TERM GOAL #4   Title Sleep undistubed by pain.   Time 4   Period Weeks   Status Achieved  sleep with no pain 05/05/15   PT LONG TERM GOAL #5   Title Perform ADL's with pain not > 2/10.   Time 4   Period Weeks   Status On-going  2-3/10 05/05/15               Plan - 05/15/15 1517    Clinical Impression Statement Pt did great today with Rx and had minimal foot pain 2/10 at heel. She did well with [place] and STW and  had the majority of soreness and tightnesss towards base of heel. She has met all LTGs except LTG for DF ROM which was 6 degrees today.   Rehab Potential Excellent   PT Frequency 3x / week   PT Duration 4 weeks   PT Treatment/Interventions Electrical Stimulation;Cryotherapy;Moist Heat;Therapeutic exercise;Therapeutic activities;Ultrasound;Manual techniques;Passive range of motion   PT Next Visit Plan IASTW to L Achilles and calf in prone with modalities  for pain per MPT POC.   Consulted and Agree with Plan of Care Patient      Patient will benefit from skilled therapeutic intervention in order to improve the following deficits and impairments:  Abnormal gait, Decreased activity tolerance, Pain, Decreased range of motion  Visit Diagnosis: Pain in left ankle and joints of left foot  Stiffness of left ankle, not elsewhere classified     Problem List There are no active problems to display for this patient.   RAMSEUR,CHRIS, PTA 05/15/2015, 4:20 PM  [facility] [address] [place], [place], [address] Phone: [phone]   Fax:  [phone]  Name: Courtney Joseph MRN: [id number] Date of Birth:  [birth year]

## 2015-05-20 ENCOUNTER — Ambulatory Visit: Payer: BC Managed Care – PPO | Attending: Orthopedic Surgery | Admitting: *Deleted

## 2015-05-20 DIAGNOSIS — M25672 Stiffness of left ankle, not elsewhere classified: Secondary | ICD-10-CM | POA: Diagnosis present

## 2015-05-20 DIAGNOSIS — M25572 Pain in left ankle and joints of left foot: Secondary | ICD-10-CM | POA: Diagnosis present

## 2015-05-20 NOTE — Therapy (Signed)
[facility] [place], [place], [address] Phone: [phone]   Fax:  [phone]  Physical Therapy Treatment  Patient Details  Name: Courtney Joseph MRN: [id number] Date of Birth: 1975-07-09 Referring Provider: Wylene Simmer MD  Encounter Date: 05/20/2015      PT End of Session - 05/20/15 1525    Visit Number 9   Number of Visits 12   Date for PT Re-Evaluation 06/04/15   PT Start Time 7619   PT Stop Time 1605   PT Time Calculation (min) 50 min      No past medical history on file.  No past surgical history on file.  There were no vitals filed for this visit.      Subjective Assessment - 05/20/15 1525    Subjective LT foot is  feeling better 2/10 today on bottom of heel   Limitations Walking   How long can you walk comfortably? Short distances.   Patient Stated Goals Get out of pain and be able to workout again.   Currently in Pain? Yes   Pain Score 2    Pain Location Heel   Pain Orientation Left   Pain Descriptors / Indicators Sore   Pain Type Chronic pain                         [facility] Adult PT Treatment/Exercise - 05/20/15 0001    Exercises   Exercises Ankle   Modalities   Modalities Moist Heat;Ultrasound   Moist Heat Therapy   Number Minutes Moist Heat 15 Minutes   Moist Heat Location Ankle   Electrical Stimulation   Electrical Stimulation Location L plantar surface of foot, L distal Calf premod x 15 mins 80-150 hz   Electrical Stimulation Goals Pain   Ultrasound   Ultrasound Location LT heel   Ultrasound Parameters 1.5 wcm2 x 10 mins 50% at 3.3 MHZ   Ultrasound Goals Pain   Manual Therapy   Manual Therapy Soft tissue mobilization   Myofascial Release IASTW and STW to LT plantar fascia and around heel with Pt supine   Ankle Exercises: Standing   Rocker Board 5 minutes  calf stretching                     PT Long Term Goals - 05/05/15 1050    PT LONG TERM GOAL #1   Title Ind with a HEP.   Time 4   Period Weeks   Status Achieved   PT LONG TERM GOAL #2   Title Increase left ankle dorsiflexion to 8 degrees with left knee in full extension.   Time 4   Period Weeks   Status On-going   PT LONG TERM GOAL #3   Title Stand 20 minutes with pain not > 2/10.   Time 4   Period Weeks   Status Achieved  2/10 05/05/15   PT LONG TERM GOAL #4   Title Sleep undistubed by pain.   Time 4   Period Weeks   Status Achieved  sleep with no pain 05/05/15   PT LONG TERM GOAL #5   Title Perform ADL's with pain not > 2/10.   Time 4   Period Weeks   Status On-going  2-3/10 05/05/15               Plan - 05/20/15 1604    Clinical Impression Statement Pt did great today and feels that she continues to progress with lower  pain levels in LT foot. DF ROM was about the same at 6 degrees. Pt also bought inserts to help with arch support.   Rehab Potential Excellent   PT Frequency 3x / week   PT Duration 4 weeks   PT Treatment/Interventions Electrical Stimulation;Cryotherapy;Moist Heat;Therapeutic exercise;Therapeutic activities;Ultrasound;Manual techniques;Passive range of motion   PT Next Visit Plan Focus on plantar fascia and heel   Consulted and Agree with Plan of Care Patient      Patient will benefit from skilled therapeutic intervention in order to improve the following deficits and impairments:  Abnormal gait, Decreased activity tolerance, Pain, Decreased range of motion  Visit Diagnosis: Pain in left ankle and joints of left foot  Stiffness of left ankle, not elsewhere classified     Problem List There are no active problems to display for this patient.   Tayvin Preslar,CHRIS, PTA 05/20/2015, 4:25 PM  [facility] [address] [place], [place], [address] Phone: [phone]   Fax:  [phone]  Name: Keiri Solano MRN: [id number] Date of Birth: [birth year]

## 2015-05-22 ENCOUNTER — Ambulatory Visit: Payer: BC Managed Care – PPO | Admitting: *Deleted

## 2015-05-22 DIAGNOSIS — M25572 Pain in left ankle and joints of left foot: Secondary | ICD-10-CM

## 2015-05-22 DIAGNOSIS — M25672 Stiffness of left ankle, not elsewhere classified: Secondary | ICD-10-CM

## 2015-05-22 NOTE — Therapy (Signed)
[facility] [place], [place], [address] Phone: [phone]   Fax:  [phone]  Physical Therapy Treatment  Patient Details  Name: Courtney Joseph MRN: [id number] Date of Birth: 1975/02/07 Referring Provider: Wylene Simmer MD  Encounter Date: 05/22/2015      PT End of Session - 05/22/15 1605    Visit Number 10   Number of Visits 12   Date for PT Re-Evaluation 06/04/15   PT Start Time 9323   PT Stop Time 1607   PT Time Calculation (min) 52 min      No past medical history on file.  No past surgical history on file.  There were no vitals filed for this visit.      Subjective Assessment - 05/22/15 1517    Subjective LT foot is  feeling better 1/10 today on bottom of heel   Limitations Walking   How long can you walk comfortably? Short distances.   Patient Stated Goals Get out of pain and be able to workout again.   Currently in Pain? Yes   Pain Score 1    Pain Location Heel   Pain Orientation Left   Pain Descriptors / Indicators Sore   Pain Type Chronic pain   Pain Onset More than a month ago   Pain Frequency Intermittent                         [facility] Adult PT Treatment/Exercise - 05/22/15 0001    Exercises   Exercises Ankle   Modalities   Modalities Moist Heat;Ultrasound   Moist Heat Therapy   Number Minutes Moist Heat 15 Minutes   Moist Heat Location Ankle   Electrical Stimulation   Electrical Stimulation Location L plantar surface of foot, L distal Calf premod x 15 mins 80-150 hz   Electrical Stimulation Goals Pain   Ultrasound   Ultrasound Location LT heel   Ultrasound Parameters 1.5 w/cm2 x 10 mins, 3.2mz   Ultrasound Goals Pain   Manual Therapy   Manual Therapy Soft tissue mobilization   Myofascial Release IASTW and STW to LT plantar fascia and around heel with Pt supine                     PT Long Term Goals - 05/05/15 1050    PT LONG TERM GOAL #1   Title Ind  with a HEP.   Time 4   Period Weeks   Status Achieved   PT LONG TERM GOAL #2   Title Increase left ankle dorsiflexion to 8 degrees with left knee in full extension.   Time 4   Period Weeks   Status On-going   PT LONG TERM GOAL #3   Title Stand 20 minutes with pain not > 2/10.   Time 4   Period Weeks   Status Achieved  2/10 05/05/15   PT LONG TERM GOAL #4   Title Sleep undistubed by pain.   Time 4   Period Weeks   Status Achieved  sleep with no pain 05/05/15   PT LONG TERM GOAL #5   Title Perform ADL's with pain not > 2/10.   Time 4   Period Weeks   Status On-going  2-3/10 05/05/15               Plan - 05/22/15 1607    Clinical Impression Statement Pt continues to do well with Rxs  and pain levels remain low. She still  has some notable tightness and tenderness in plantar fascia during STW. She has met all goals except DF ROM due to tightness. Still 6 degrees   Rehab Potential Excellent   PT Frequency 3x / week   PT Duration 4 weeks   PT Treatment/Interventions Electrical Stimulation;Cryotherapy;Moist Heat;Therapeutic exercise;Therapeutic activities;Ultrasound;Manual techniques;Passive range of motion   PT Next Visit Plan Focus on plantar fascia and heel 2 visits left and DC      Patient will benefit from skilled therapeutic intervention in order to improve the following deficits and impairments:  Abnormal gait, Decreased activity tolerance, Pain, Decreased range of motion  Visit Diagnosis: Pain in left ankle and joints of left foot  Stiffness of left ankle, not elsewhere classified     Problem List There are no active problems to display for this patient.   Chonita Gadea,CHRIS, PTA 05/22/2015, 4:19 PM  [facility] [address] [place], [place], [address] Phone: [phone]   Fax:  [phone]  Name: Kadejah Sandiford MRN: [id number] Date of Birth: 03-Aug-1975

## 2015-05-27 ENCOUNTER — Ambulatory Visit: Payer: BC Managed Care – PPO | Admitting: *Deleted

## 2015-05-27 DIAGNOSIS — M25572 Pain in left ankle and joints of left foot: Secondary | ICD-10-CM | POA: Diagnosis not present

## 2015-05-27 DIAGNOSIS — M25672 Stiffness of left ankle, not elsewhere classified: Secondary | ICD-10-CM

## 2015-05-27 NOTE — Therapy (Signed)
[facility] [place], [place], [address] Phone: [phone]   Fax:  [phone]  Physical Therapy Treatment  Patient Details  Name: Courtney Joseph MRN: [id number] Date of Birth: 07-20-1975 Referring Provider: Wylene Simmer MD  Encounter Date: 05/27/2015      PT End of Session - 05/27/15 1520    Visit Number 11   Number of Visits 12   Date for PT Re-Evaluation 06/04/15   PT Start Time 4967   PT Stop Time 1608   PT Time Calculation (min) 53 min      No past medical history on file.  No past surgical history on file.  There were no vitals filed for this visit.      Subjective Assessment - 05/27/15 1514    Subjective Lt foot has been about a 3/10 the last 3 days.   Limitations Walking   How long can you walk comfortably? Short distances.   Patient Stated Goals Get out of pain and be able to workout again.   Currently in Pain? Yes   Pain Score 3    Pain Location Heel   Pain Orientation Left   Pain Descriptors / Indicators Sore   Pain Type Chronic pain   Pain Onset More than a month ago   Pain Frequency Intermittent                         [facility] Adult PT Treatment/Exercise - 05/27/15 0001    Exercises   Exercises Ankle   Modalities   Modalities Moist Heat;Ultrasound   Moist Heat Therapy   Number Minutes Moist Heat 15 Minutes   Moist Heat Location Ankle   Electrical Stimulation   Electrical Stimulation Location L plantar surface of foot, L distal Calf premod x 15 mins 80-150 hz   Electrical Stimulation Goals Pain   Ultrasound   Ultrasound Location LT heel   Ultrasound Parameters 1.5 w/cm2 x 10 mins at 3.3 mhz   Ultrasound Goals Pain   Manual Therapy   Manual Therapy Soft tissue mobilization   Myofascial Release IASTW and STW to LT plantar fascia, around heel and along 4th and 5th digits with Pt supine                     PT Long Term Goals - 05/05/15 1050    PT LONG TERM  GOAL #1   Title Ind with a HEP.   Time 4   Period Weeks   Status Achieved   PT LONG TERM GOAL #2   Title Increase left ankle dorsiflexion to 8 degrees with left knee in full extension.   Time 4   Period Weeks   Status On-going   PT LONG TERM GOAL #3   Title Stand 20 minutes with pain not > 2/10.   Time 4   Period Weeks   Status Achieved  2/10 05/05/15   PT LONG TERM GOAL #4   Title Sleep undistubed by pain.   Time 4   Period Weeks   Status Achieved  sleep with no pain 05/05/15   PT LONG TERM GOAL #5   Title Perform ADL's with pain not > 2/10.   Time 4   Period Weeks   Status On-going  2-3/10 05/05/15               Plan - 05/27/15 1735    Clinical Impression Statement Pt did fairly well with Rx today, but had  increased soreness upon arrival  along the bottom side of her 4th and 5th digits LT foot. This pain was new and discussed paying attention during  her gait cycle  and to see if she noticed any WB deviations.to the outside of her foot.  Her pain level was low at the heel and into the  plantar fascia. No new LTGs met today.   Rehab Potential Excellent   PT Frequency 3x / week   PT Duration 4 weeks   PT Treatment/Interventions Electrical Stimulation;Cryotherapy;Moist Heat;Therapeutic exercise;Therapeutic activities;Ultrasound;Manual techniques;Passive range of motion   PT Next Visit Plan 1 visits left and DC   Consulted and Agree with Plan of Care Patient      Patient will benefit from skilled therapeutic intervention in order to improve the following deficits and impairments:  Abnormal gait, Decreased activity tolerance, Pain, Decreased range of motion  Visit Diagnosis: Pain in left ankle and joints of left foot  Stiffness of left ankle, not elsewhere classified     Problem List There are no active problems to display for this patient.   RAMSEUR,CHRIS, PTA 05/27/2015, 5:49 PM  [facility] [address] [place], [place], [address] Phone: [phone]   Fax:  [phone]  Name: Courtney Joseph MRN: [id number] Date of Birth: [birth year]

## 2015-05-29 ENCOUNTER — Ambulatory Visit: Payer: BC Managed Care – PPO | Admitting: *Deleted

## 2015-05-29 DIAGNOSIS — M25572 Pain in left ankle and joints of left foot: Secondary | ICD-10-CM | POA: Diagnosis not present

## 2015-05-29 DIAGNOSIS — M25672 Stiffness of left ankle, not elsewhere classified: Secondary | ICD-10-CM

## 2015-05-29 NOTE — Therapy (Signed)
[facility] [place], [place], [address] Phone: [phone]   Fax:  [phone]  Physical Therapy Treatment  Patient Details  Name: Courtney Joseph MRN: [id number] Date of Birth: [birth year] Referring Provider: Wylene Simmer MD  Encounter Date: 05/29/2015      PT End of Session - 05/29/15 1538    Visit Number 12   Number of Visits 12   Date for PT Re-Evaluation 06/04/15   PT Start Time 4166   PT Stop Time 1607   PT Time Calculation (min) 52 min      No past medical history on file.  No past surgical history on file.  There were no vitals filed for this visit.      Subjective Assessment - 05/29/15 1528    Subjective Lt foot has been great today    Limitations Walking   Currently in Pain? No/denies                         [facility] Adult PT Treatment/Exercise - 05/29/15 0001    Exercises   Exercises Ankle   Modalities   Modalities Moist Heat;Ultrasound   Moist Heat Therapy   Number Minutes Moist Heat 15 Minutes   Moist Heat Location Ankle   Electrical Stimulation   Electrical Stimulation Location L plantar surface of foot, L distal Calf premod x 15 mins 80-150 hz   Electrical Stimulation Goals Pain   Ultrasound   Ultrasound Location LT heel   Ultrasound Parameters 1.5 w/cm2 x 10 mins   Ultrasound Goals Pain   Manual Therapy   Manual Therapy Soft tissue mobilization   Myofascial Release IASTW and STW to LT plantar fascia, around heel and along 4th and 5th digits with Pt supine                     PT Long Term Goals - 05/29/15 1541    PT LONG TERM GOAL #1   Title Ind with a HEP.   Time 4   Period Weeks   Status Achieved   PT LONG TERM GOAL #2   Title Increase left ankle dorsiflexion to 8 degrees with left knee in full extension.   Period Weeks   Status On-going   PT LONG TERM GOAL #3   Title Stand 20 minutes with pain not > 2/10.   Time 4   Period Weeks   Status Achieved    PT LONG TERM GOAL #4   Title Sleep undistubed by pain.   Time 4   Period Weeks   Status Achieved   PT LONG TERM GOAL #5   Title Perform ADL's with pain not > 2/10.   Period Weeks   Status Achieved               Plan - 05/29/15 1539    Clinical Impression Statement Pt did great today with no pain in LT foot and has met all LTGs. DF ROM was 8 degrees today and Pt feels ready to DC to HEP.   Rehab Potential Excellent   PT Frequency 3x / week   PT Duration 4 weeks   PT Treatment/Interventions Electrical Stimulation;Cryotherapy;Moist Heat;Therapeutic exercise;Therapeutic activities;Ultrasound;Manual techniques;Passive range of motion   PT Next Visit Plan DC to HEP   Consulted and Agree with Plan of Care Patient      Patient will benefit from skilled therapeutic intervention in order to improve the following deficits and impairments:  Visit Diagnosis: Pain in left ankle and joints of left foot  Stiffness of left ankle, not elsewhere classified     Problem List There are no active problems to display for this patient.   RAMSEUR,CHRIS, PTA 05/29/2015, 6:27 PM  [facility] [address] [place], [place], [address] Phone: [phone]   Fax:  [phone]  Name: Courtney Joseph MRN: [id number] Date of Birth: [birth year]

## 2015-05-29 NOTE — Therapy (Signed)
[facility] [place], [place], [address] Phone: [phone]   Fax:  [phone]  Physical Therapy Treatment  Patient Details  Name: Courtney Joseph MRN: [id number] Date of Birth: June 27, 1975 Referring Provider: Wylene Simmer MD  Encounter Date: 05/29/2015      PT End of Session - 05/29/15 1538    Visit Number 12   Number of Visits 12   Date for PT Re-Evaluation 06/04/15   PT Start Time 5852   PT Stop Time 1607   PT Time Calculation (min) 52 min      No past medical history on file.  No past surgical history on file.  There were no vitals filed for this visit.      Subjective Assessment - 05/29/15 1528    Subjective Lt foot has been great today    Limitations Walking   Currently in Pain? No/denies                         [facility] Adult PT Treatment/Exercise - 05/29/15 0001    Exercises   Exercises Ankle   Modalities   Modalities Moist Heat;Ultrasound   Moist Heat Therapy   Number Minutes Moist Heat 15 Minutes   Moist Heat Location Ankle   Electrical Stimulation   Electrical Stimulation Location L plantar surface of foot, L distal Calf premod x 15 mins 80-150 hz   Electrical Stimulation Goals Pain   Ultrasound   Ultrasound Location LT heel   Ultrasound Parameters 1.5 w/cm2 x 10 mins   Ultrasound Goals Pain   Manual Therapy   Manual Therapy Soft tissue mobilization   Myofascial Release IASTW and STW to LT plantar fascia, around heel and along 4th and 5th digits with Pt supine                     PT Long Term Goals - 05/29/15 1541    PT LONG TERM GOAL #1   Title Ind with a HEP.   Time 4   Period Weeks   Status Achieved   PT LONG TERM GOAL #2   Title Increase left ankle dorsiflexion to 8 degrees with left knee in full extension.   Period Weeks   Status On-going   PT LONG TERM GOAL #3   Title Stand 20 minutes with pain not > 2/10.   Time 4   Period Weeks   Status Achieved    PT LONG TERM GOAL #4   Title Sleep undistubed by pain.   Time 4   Period Weeks   Status Achieved   PT LONG TERM GOAL #5   Title Perform ADL's with pain not > 2/10.   Period Weeks   Status Achieved               Plan - 05/29/15 1539    Clinical Impression Statement Pt did great today with no pain in LT foot and has met all LTGs. DF ROM was 8 degrees today and Pt feels ready to DC to HEP.   Rehab Potential Excellent   PT Frequency 3x / week   PT Duration 4 weeks   PT Treatment/Interventions Electrical Stimulation;Cryotherapy;Moist Heat;Therapeutic exercise;Therapeutic activities;Ultrasound;Manual techniques;Passive range of motion   PT Next Visit Plan DC to HEP   Consulted and Agree with Plan of Care Patient      Patient will benefit from skilled therapeutic intervention in order to improve the following deficits and impairments:  Visit Diagnosis: Pain in left ankle and joints of left foot  Stiffness of left ankle, not elsewhere classified     Problem List There are no active problems to display for this patient.  PHYSICAL THERAPY DISCHARGE SUMMARY  Visits from Start of Care: 12  Current functional level related to goals / functional outcomes: Please see above.   Remaining deficits: All goals met.   Education / Equipment: HEP.  Plan: Patient agrees to discharge.  Patient goals were met. Patient is being discharged due to meeting the stated rehab goals.  ?????      Thersa Mohiuddin, [place] MPT 05/29/2015, 7:12 PM  [facility] [address] [place], [place], [address] Phone: [phone]   Fax:  [phone]  Name: Courtney Joseph MRN: [id number] Date of Birth: Jun 29, 1975

## 2015-10-20 ENCOUNTER — Other Ambulatory Visit: Payer: Self-pay | Admitting: Orthopedic Surgery

## 2015-10-22 ENCOUNTER — Encounter (HOSPITAL_BASED_OUTPATIENT_CLINIC_OR_DEPARTMENT_OTHER): Payer: Self-pay | Admitting: *Deleted

## 2015-10-23 ENCOUNTER — Encounter (HOSPITAL_BASED_OUTPATIENT_CLINIC_OR_DEPARTMENT_OTHER): Payer: Self-pay | Admitting: *Deleted

## 2015-10-30 ENCOUNTER — Ambulatory Visit (HOSPITAL_BASED_OUTPATIENT_CLINIC_OR_DEPARTMENT_OTHER)
Admission: RE | Admit: 2015-10-30 | Discharge: 2015-10-30 | Disposition: A | Discharge status: Home / Self Care | Payer: BC Managed Care – PPO | Source: Ambulatory Visit | Attending: Orthopedic Surgery | Admitting: Orthopedic Surgery

## 2015-10-30 ENCOUNTER — Ambulatory Visit (HOSPITAL_BASED_OUTPATIENT_CLINIC_OR_DEPARTMENT_OTHER): Payer: BC Managed Care – PPO | Admitting: Anesthesiology

## 2015-10-30 ENCOUNTER — Encounter (HOSPITAL_BASED_OUTPATIENT_CLINIC_OR_DEPARTMENT_OTHER): Payer: Self-pay

## 2015-10-30 ENCOUNTER — Encounter (HOSPITAL_BASED_OUTPATIENT_CLINIC_OR_DEPARTMENT_OTHER)
Admission: RE | Disposition: A | Discharge status: Home / Self Care | Payer: Self-pay | Source: Ambulatory Visit | Attending: Orthopedic Surgery

## 2015-10-30 DIAGNOSIS — X58XXXA Exposure to other specified factors, initial encounter: Secondary | ICD-10-CM | POA: Diagnosis not present

## 2015-10-30 DIAGNOSIS — N189 Chronic kidney disease, unspecified: Secondary | ICD-10-CM | POA: Insufficient documentation

## 2015-10-30 DIAGNOSIS — F419 Anxiety disorder, unspecified: Secondary | ICD-10-CM | POA: Diagnosis not present

## 2015-10-30 DIAGNOSIS — Z880 Allergy status to penicillin: Secondary | ICD-10-CM | POA: Diagnosis not present

## 2015-10-30 DIAGNOSIS — S92022A Displaced fracture of anterior process of left calcaneus, initial encounter for closed fracture: Secondary | ICD-10-CM | POA: Diagnosis present

## 2015-10-30 DIAGNOSIS — Z87891 Personal history of nicotine dependence: Secondary | ICD-10-CM | POA: Insufficient documentation

## 2015-10-30 DIAGNOSIS — Z9889 Other specified postprocedural states: Secondary | ICD-10-CM

## 2015-10-30 HISTORY — DX: Chronic kidney disease, unspecified: N18.9

## 2015-10-30 HISTORY — DX: Headache, unspecified: R51.9

## 2015-10-30 HISTORY — DX: Enthesopathy, unspecified: M77.9

## 2015-10-30 HISTORY — DX: Major depressive disorder, single episode, unspecified: F32.9

## 2015-10-30 HISTORY — DX: Depression, unspecified: F32.A

## 2015-10-30 HISTORY — DX: Anxiety disorder, unspecified: F41.9

## 2015-10-30 HISTORY — DX: Headache: R51

## 2015-10-30 HISTORY — PX: BONE EXCISION: SHX6730

## 2015-10-30 SURGERY — BONE EXCISION
Anesthesia: Monitor Anesthesia Care | Site: Foot | Laterality: Left

## 2015-10-30 MED ORDER — EPHEDRINE 5 MG/ML INJ
INTRAVENOUS | Status: AC
  Filled 2015-10-30: qty 10

## 2015-10-30 MED ORDER — PROPOFOL 500 MG/50ML IV EMUL
INTRAVENOUS | Status: AC
  Filled 2015-10-30: qty 50

## 2015-10-30 MED ORDER — OXYCODONE HCL 5 MG PO TABS: 5.0000 mg | ORAL_TABLET | ORAL | 0 refills | Status: DC | PRN

## 2015-10-30 MED ORDER — SENNA 8.6 MG PO TABS: 2.0000 | ORAL_TABLET | Freq: Two times a day (BID) | ORAL | 0 refills | Status: DC

## 2015-10-30 MED ORDER — ONDANSETRON HCL 4 MG/2ML IJ SOLN
INTRAMUSCULAR | Status: DC | PRN
  Administered 2015-10-30: 4 mg via INTRAVENOUS

## 2015-10-30 MED ORDER — CEFAZOLIN SODIUM-DEXTROSE 2-4 GM/100ML-% IV SOLN
2.0000 g | INTRAVENOUS | Status: AC
  Administered 2015-10-30: 2 g via INTRAVENOUS

## 2015-10-30 MED ORDER — DEXAMETHASONE SODIUM PHOSPHATE 10 MG/ML IJ SOLN
INTRAMUSCULAR | Status: DC | PRN
Start: 2015-10-30 — End: 2015-10-30
  Administered 2015-10-30: 4 mg via INTRAVENOUS

## 2015-10-30 MED ORDER — LIDOCAINE 2% (20 MG/ML) 5 ML SYRINGE
INTRAMUSCULAR | Status: AC
  Filled 2015-10-30: qty 5

## 2015-10-30 MED ORDER — BUPIVACAINE-EPINEPHRINE (PF) 0.5% -1:200000 IJ SOLN
INTRAMUSCULAR | Status: DC | PRN
  Administered 2015-10-30: 25 mL via PERINEURAL

## 2015-10-30 MED ORDER — MIDAZOLAM HCL 2 MG/2ML IJ SOLN
INTRAMUSCULAR | Status: AC
  Filled 2015-10-30: qty 2

## 2015-10-30 MED ORDER — PROPOFOL 500 MG/50ML IV EMUL
INTRAVENOUS | Status: DC | PRN
  Administered 2015-10-30: 75 ug/kg/min via INTRAVENOUS

## 2015-10-30 MED ORDER — GLYCOPYRROLATE 0.2 MG/ML IJ SOLN: 0.2000 mg | Freq: Once | INTRAMUSCULAR | Status: DC | PRN

## 2015-10-30 MED ORDER — OXYCODONE HCL 5 MG/5ML PO SOLN: 5.0000 mg | Freq: Once | ORAL | Status: DC | PRN

## 2015-10-30 MED ORDER — PROPOFOL 10 MG/ML IV BOLUS
INTRAVENOUS | Status: DC | PRN
  Administered 2015-10-30: 150 mg via INTRAVENOUS
  Administered 2015-10-30: 50 mg via INTRAVENOUS

## 2015-10-30 MED ORDER — SCOPOLAMINE 1 MG/3DAYS TD PT72: 1.0000 | MEDICATED_PATCH | Freq: Once | TRANSDERMAL | Status: DC | PRN

## 2015-10-30 MED ORDER — LACTATED RINGERS IV SOLN
INTRAVENOUS | Status: DC
  Administered 2015-10-30: 08:00:00 via INTRAVENOUS

## 2015-10-30 MED ORDER — DEXAMETHASONE SODIUM PHOSPHATE 10 MG/ML IJ SOLN
INTRAMUSCULAR | Status: AC
  Filled 2015-10-30: qty 1

## 2015-10-30 MED ORDER — PROMETHAZINE HCL 25 MG/ML IJ SOLN: 6.2500 mg | INTRAMUSCULAR | Status: DC | PRN

## 2015-10-30 MED ORDER — CHLORHEXIDINE GLUCONATE 4 % EX LIQD: 60.0000 mL | Freq: Once | CUTANEOUS | Status: DC

## 2015-10-30 MED ORDER — FENTANYL CITRATE (PF) 100 MCG/2ML IJ SOLN: 25.0000 ug | INTRAMUSCULAR | Status: DC | PRN

## 2015-10-30 MED ORDER — BUPIVACAINE-EPINEPHRINE (PF) 0.5% -1:200000 IJ SOLN
INTRAMUSCULAR | Status: AC
  Filled 2015-10-30: qty 30

## 2015-10-30 MED ORDER — FENTANYL CITRATE (PF) 100 MCG/2ML IJ SOLN
INTRAMUSCULAR | Status: AC
  Filled 2015-10-30: qty 2

## 2015-10-30 MED ORDER — OXYCODONE HCL 5 MG PO TABS: 5.0000 mg | ORAL_TABLET | Freq: Once | ORAL | Status: DC | PRN

## 2015-10-30 MED ORDER — SODIUM CHLORIDE 0.9 % IV SOLN: INTRAVENOUS | Status: DC

## 2015-10-30 MED ORDER — DOCUSATE SODIUM 100 MG PO CAPS: 100.0000 mg | ORAL_CAPSULE | Freq: Two times a day (BID) | ORAL | 0 refills | Status: DC

## 2015-10-30 MED ORDER — MIDAZOLAM HCL 2 MG/2ML IJ SOLN
1.0000 mg | INTRAMUSCULAR | Status: DC | PRN
  Administered 2015-10-30: 1 mg via INTRAVENOUS
  Administered 2015-10-30: 2 mg via INTRAVENOUS

## 2015-10-30 MED ORDER — FENTANYL CITRATE (PF) 100 MCG/2ML IJ SOLN
50.0000 ug | INTRAMUSCULAR | Status: DC | PRN
  Administered 2015-10-30: 100 ug via INTRAVENOUS

## 2015-10-30 MED ORDER — ONDANSETRON HCL 4 MG/2ML IJ SOLN
INTRAMUSCULAR | Status: AC
  Filled 2015-10-30: qty 2

## 2015-10-30 MED ORDER — 0.9 % SODIUM CHLORIDE (POUR BTL) OPTIME
TOPICAL | Status: DC | PRN
  Administered 2015-10-30: 200 mL

## 2015-10-30 SURGICAL SUPPLY — 73 items
BANDAGE ESMARK 6X9 LF (GAUZE/BANDAGES/DRESSINGS) IMPLANT
BLADE AVERAGE 25X9 (BLADE) ×2 IMPLANT
BLADE MICRO SAGITTAL (BLADE) IMPLANT
BLADE SURG 15 STRL LF DISP TIS (BLADE) ×2 IMPLANT
BLADE SURG 15 STRL SS (BLADE) ×4
BNDG CMPR 9X4 STRL LF SNTH (GAUZE/BANDAGES/DRESSINGS)
BNDG CMPR 9X6 STRL LF SNTH (GAUZE/BANDAGES/DRESSINGS)
BNDG COHESIVE 4X5 TAN STRL (GAUZE/BANDAGES/DRESSINGS) IMPLANT
BNDG COHESIVE 6X5 TAN STRL LF (GAUZE/BANDAGES/DRESSINGS) IMPLANT
BNDG CONFORM 3 STRL LF (GAUZE/BANDAGES/DRESSINGS) IMPLANT
BNDG ESMARK 4X9 LF (GAUZE/BANDAGES/DRESSINGS) IMPLANT
BNDG ESMARK 6X9 LF (GAUZE/BANDAGES/DRESSINGS)
BOOT STEPPER DURA MED (SOFTGOODS) ×2 IMPLANT
CHLORAPREP W/TINT 26ML (MISCELLANEOUS) ×2 IMPLANT
CORDS BIPOLAR (ELECTRODE) ×2 IMPLANT
COVER BACK TABLE 60X90IN (DRAPES) ×2 IMPLANT
CUFF TOURNIQUET SINGLE 18IN (TOURNIQUET CUFF) ×2 IMPLANT
CUFF TOURNIQUET SINGLE 24IN (TOURNIQUET CUFF) IMPLANT
CUFF TOURNIQUET SINGLE 34IN LL (TOURNIQUET CUFF) IMPLANT
DRAPE EXTREMITY T 121X128X90 (DRAPE) ×2 IMPLANT
DRAPE OEC MINIVIEW 54X84 (DRAPES) ×2 IMPLANT
DRAPE SURG 17X23 STRL (DRAPES) IMPLANT
DRAPE U-SHAPE 47X51 STRL (DRAPES) ×2 IMPLANT
DRSG MEPITEL 4X7.2 (GAUZE/BANDAGES/DRESSINGS) ×2 IMPLANT
DRSG PAD ABDOMINAL 8X10 ST (GAUZE/BANDAGES/DRESSINGS) ×2 IMPLANT
ELECT REM PT RETURN 9FT ADLT (ELECTROSURGICAL) ×2
ELECTRODE REM PT RTRN 9FT ADLT (ELECTROSURGICAL) ×1 IMPLANT
GAUZE SPONGE 4X4 12PLY STRL (GAUZE/BANDAGES/DRESSINGS) ×4 IMPLANT
GAUZE SPONGE 4X4 16PLY XRAY LF (GAUZE/BANDAGES/DRESSINGS) IMPLANT
GLOVE BIO SURGEON STRL SZ8 (GLOVE) ×2 IMPLANT
GLOVE BIOGEL PI IND STRL 7.0 (GLOVE) ×4 IMPLANT
GLOVE BIOGEL PI IND STRL 8 (GLOVE) ×2 IMPLANT
GLOVE BIOGEL PI INDICATOR 7.0 (GLOVE) ×4
GLOVE BIOGEL PI INDICATOR 8 (GLOVE) ×2
GLOVE ECLIPSE 6.5 STRL STRAW (GLOVE) ×4 IMPLANT
GLOVE ECLIPSE 7.5 STRL STRAW (GLOVE) ×2 IMPLANT
GLOVE EXAM NITRILE MD LF STRL (GLOVE) IMPLANT
GOWN STRL REUS W/ TWL LRG LVL3 (GOWN DISPOSABLE) ×1 IMPLANT
GOWN STRL REUS W/ TWL XL LVL3 (GOWN DISPOSABLE) ×2 IMPLANT
GOWN STRL REUS W/TWL LRG LVL3 (GOWN DISPOSABLE) ×2
GOWN STRL REUS W/TWL XL LVL3 (GOWN DISPOSABLE) ×4
NEEDLE HYPO 22GX1.5 SAFETY (NEEDLE) IMPLANT
NEEDLE HYPO 25X1 1.5 SAFETY (NEEDLE) IMPLANT
NS IRRIG 1000ML POUR BTL (IV SOLUTION) ×2 IMPLANT
PACK BASIN DAY SURGERY FS (CUSTOM PROCEDURE TRAY) ×2 IMPLANT
PAD CAST 4YDX4 CTTN HI CHSV (CAST SUPPLIES) ×1 IMPLANT
PADDING CAST ABS 4INX4YD NS (CAST SUPPLIES)
PADDING CAST ABS COTTON 4X4 ST (CAST SUPPLIES) IMPLANT
PADDING CAST COTTON 4X4 STRL (CAST SUPPLIES) ×2
PADDING CAST COTTON 6X4 STRL (CAST SUPPLIES) ×2 IMPLANT
PENCIL BUTTON HOLSTER BLD 10FT (ELECTRODE) IMPLANT
SANITIZER HAND PURELL 535ML FO (MISCELLANEOUS) ×2 IMPLANT
SHEET MEDIUM DRAPE 40X70 STRL (DRAPES) ×2 IMPLANT
SLEEVE SCD COMPRESS KNEE MED (MISCELLANEOUS) ×2 IMPLANT
SPONGE LAP 18X18 X RAY DECT (DISPOSABLE) ×2 IMPLANT
SPONGE SURGIFOAM ABS GEL 12-7 (HEMOSTASIS) ×2 IMPLANT
STOCKINETTE 6  STRL (DRAPES) ×1
STOCKINETTE 6 STRL (DRAPES) ×1 IMPLANT
STRIP CLOSURE SKIN 1/2X4 (GAUZE/BANDAGES/DRESSINGS) IMPLANT
SUCTION FRAZIER HANDLE 10FR (MISCELLANEOUS) ×1
SUCTION TUBE FRAZIER 10FR DISP (MISCELLANEOUS) ×1 IMPLANT
SUT ETHILON 3 0 PS 1 (SUTURE) IMPLANT
SUT ETHILON 4 0 PS 2 18 (SUTURE) ×2 IMPLANT
SUT MNCRL AB 3-0 PS2 18 (SUTURE) IMPLANT
SUT MNCRL AB 4-0 PS2 18 (SUTURE) ×2 IMPLANT
SUT VIC AB 0 SH 27 (SUTURE) IMPLANT
SUT VIC AB 2-0 PS2 27 (SUTURE) ×2 IMPLANT
SYR BULB 3OZ (MISCELLANEOUS) ×2 IMPLANT
SYR CONTROL 10ML LL (SYRINGE) IMPLANT
TOWEL OR 17X24 6PK STRL BLUE (TOWEL DISPOSABLE) ×2 IMPLANT
TUBE CONNECTING 20X1/4 (TUBING) ×2 IMPLANT
UNDERPAD 30X30 (UNDERPADS AND DIAPERS) ×2 IMPLANT
YANKAUER SUCT BULB TIP NO VENT (SUCTIONS) IMPLANT

## 2015-10-30 NOTE — H&P (Signed)
Courtney Joseph is an 40 y.o. female.   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  She has failed non op treatment and presents today for surgery.  Past Medical History:  Diagnosis Date  . Anxiety   . Bone spur   . Chronic kidney disease    h/o kidney stones  . Depression   . Headache    migraines    Past Surgical History:  Procedure Laterality Date  . uterine ablation    . WISDOM TOOTH EXTRACTION      History reviewed. No pertinent family history. Social History:  reports that she quit smoking about 4 years ago. She has never used smokeless tobacco. She reports that she drinks alcohol. She reports that she does not use drugs.  Allergies:  Allergies  Allergen Reactions  . Other Anaphylaxis    Red meat, pork, lamb, deer meat  . Erythromycin Hives  . Keflex [Cephalexin] Hives  . Penicillins Hives  . Sulfa Antibiotics Hives    Medications Prior to Admission  Medication Sig Dispense Refill  . acetaminophen-codeine (TYLENOL #3) 300-30 MG tablet Take by mouth every 4 (four) hours as needed for moderate pain.    Marland Kitchen escitalopram (LEXAPRO) 20 MG tablet Take 20 mg by mouth daily.      No results found for this or any previous visit (from the past 48 hour(s)). No results found.  ROS  No recent f/c/n/v/wt loss  Blood pressure 133/76, pulse 84, temperature 98.3 F (36.8 C), temperature source Oral, resp. rate 20, height 5' 7"  (1.702 m), weight 87.5 kg (193 lb), last menstrual period 06/23/2015, SpO2 100 %. Physical Exam  wn wd woman in nad.  A and O x 4.  Mood and affect normal.  EOMI.  resp unlabored.  L foot with healthy skin.  No lymphadenopathy.  5/5 strength in PF and DF of the toes.  TTP at ant process.  Sens to LT intact at the lateral hindfoot.  Assessment/Plan L calcaneus non union - to OR for excision of non union fragments.  The risks and benefits of the alternative treatment options have been discussed  in detail.  The patient wishes to proceed with surgery and specifically understands risks of bleeding, infection, nerve damage, blood clots, need for additional surgery, amputation and death.   Wylene Simmer, MD 2015-11-23, 9:05 AM

## 2015-10-30 NOTE — H&P (Signed)
Assisted Dr.Richard Guidetti with left, ultrasound guided, popliteal block. Side rails up, monitors on throughout procedure. See vital signs in flow sheet. Tolerated Procedure well.

## 2015-10-30 NOTE — Brief Op Note (Signed)
10/30/2015  10:29 AM  PATIENT:  Courtney Joseph  40 y.o. female  PRE-OPERATIVE DIAGNOSIS:  Left calcaneus fracture nonunion  POST-OPERATIVE DIAGNOSIS:  same  Procedure(s): Excision of left calcaneus anterior process nonunion  SURGEON:  Wylene Simmer, MD  ASSISTANT: Mechele Claude, PA-C  ANESTHESIA:   General, regional  EBL:  minimal   TOURNIQUET:   Total Tourniquet Time Documented: Calf (Left) - 22 minutes Total: Calf (Left) - 22 minutes  COMPLICATIONS:  None apparent  DISPOSITION:  Extubated, awake and stable to recovery.  DICTATION ID:

## 2015-10-30 NOTE — Anesthesia Preprocedure Evaluation (Addendum)
Anesthesia Evaluation  Patient identified by MRN, date of birth, ID band Patient awake    Reviewed: Allergy & Precautions, NPO status , Patient's Chart, lab work & pertinent test results  Airway Mallampati: II  TM Distance: >3 FB Neck ROM: Full    Dental no notable dental hx.    Pulmonary neg pulmonary ROS, former smoker,    Pulmonary exam normal        Cardiovascular negative cardio ROS Normal cardiovascular exam     Neuro/Psych PSYCHIATRIC DISORDERS Anxiety Depression negative neurological ROS     GI/Hepatic negative GI ROS, Neg liver ROS,   Endo/Other  negative endocrine ROS  Renal/GU negative Renal ROS  negative genitourinary   Musculoskeletal negative musculoskeletal ROS (+)   Abdominal   Peds negative pediatric ROS (+)  Hematology negative hematology ROS (+)   Anesthesia Other Findings   Reproductive/Obstetrics negative OB ROS                             Anesthesia Physical Anesthesia Plan  ASA: I  Anesthesia Plan: MAC and Regional   Post-op Pain Management:  Regional for Post-op pain   Induction: Intravenous  Airway Management Planned: Simple Face Mask  Additional Equipment:   Intra-op Plan:   Post-operative Plan:   Informed Consent: I have reviewed the patients History and Physical, chart, labs and discussed the procedure including the risks, benefits and alternatives for the proposed anesthesia with the patient or authorized representative who has indicated his/her understanding and acceptance.   Dental advisory given  Plan Discussed with: CRNA  Anesthesia Plan Comments:        Anesthesia Quick Evaluation Lateral popliteal block and MAC. If unable to tolerate tourniquette, will proceed with general.

## 2015-10-30 NOTE — Discharge Instructions (Addendum)
Courtney Simmer, MD [name]  Please read the following information regarding your care after surgery.  Medications  You only need a prescription for the narcotic pain medicine (ex. oxycodone, Percocet, Norco).  All of the other medicines listed below are available over the counter. X acetominophen (Tylenol) 650 mg every 4-6 hours as you need for minor pain X oxycodone as prescribed for moderate to severe pain ?   Narcotic pain medicine (ex. oxycodone, Percocet, Vicodin) will cause constipation.  To prevent this problem, take the following medicines while you are taking any pain medicine. X docusate sodium (Colace) 100 mg twice a day X senna (Senokot) 2 tablets twice a day  Weight Bearing X Bear weight when you are able on your operated leg or foot in the CAM boot.  Cast / Splint / Dressing X Keep your dressing clean and dry.  Dont put anything (coat hanger, pencil, etc) down inside of it.  If it gets damp, use a hair dryer on the cool setting to dry it.  If it gets soaked, call the office to schedule an appointment for a cast change.   After your dressing, cast or splint is removed; you may shower, but do not soak or scrub the wound.  Allow the water to run over it, and then gently pat it dry.  Swelling It is normal for you to have swelling where you had surgery.  To reduce swelling and pain, keep your toes above your nose for at least 3 days after surgery.  It may be necessary to keep your foot or leg elevated for several weeks.  If it hurts, it should be elevated.  Follow Up Call my office at [phone] when you are discharged from the hospital or surgery center to schedule an appointment to be seen two weeks after surgery.  Call my office at [phone] if you develop a fever >101.5 F, nausea, vomiting, bleeding from the surgical site or severe pain.     Regional Anesthesia Blocks  1. Numbness or the inability to move the "blocked" extremity may last from 3-48  hours after placement. The length of time depends on the medication injected and your individual response to the medication. If the numbness is not going away after 48 hours, call your surgeon.  2. The extremity that is blocked will need to be protected until the numbness is gone and the  Strength has returned. Because you cannot feel it, you will need to take extra care to avoid injury. Because it may be weak, you may have difficulty moving it or using it. You may not know what position it is in without looking at it while the block is in effect.  3. For blocks in the legs and feet, returning to weight bearing and walking needs to be done carefully. You will need to wait until the numbness is entirely gone and the strength has returned. You should be able to move your leg and foot normally before you try and bear weight or walk. You will need someone to be with you when you first try to ensure you do not fall and possibly risk injury.  4. Bruising and tenderness at the needle site are common side effects and will resolve in a few days.  5. Persistent numbness or new problems with movement should be communicated to the surgeon or the [place] [phone] [place] [phone]).      Post Anesthesia Home Care Instructions  Activity: Get plenty of  rest for the remainder of the day. A responsible adult should stay with you for 24 hours following the procedure.  For the next 24 hours, DO NOT: -Drive a car -Paediatric nurse -Drink alcoholic beverages -Take any medication unless instructed by your physician -Make any legal decisions or sign important papers.  Meals: Start with liquid foods such as gelatin or soup. Progress to regular foods as tolerated. Avoid greasy, spicy, heavy foods. If nausea and/or vomiting occur, drink only clear liquids until the nausea and/or vomiting subsides. Call your physician if vomiting continues.  Special  Instructions/Symptoms: Your throat may feel dry or sore from the anesthesia or the breathing tube placed in your throat during surgery. If this causes discomfort, gargle with warm salt water. The discomfort should disappear within 24 hours.  If you had a scopolamine patch placed behind your ear for the management of post- operative nausea and/or vomiting:  1. The medication in the patch is effective for 72 hours, after which it should be removed.  Wrap patch in a tissue and discard in the trash. Wash hands thoroughly with soap and water. 2. You may remove the patch earlier than 72 hours if you experience unpleasant side effects which may include dry mouth, dizziness or visual disturbances. 3. Avoid touching the patch. Wash your hands with soap and water after contact with the patch.

## 2015-10-30 NOTE — Anesthesia Postprocedure Evaluation (Signed)
Anesthesia Post Note  Patient: Courtney Joseph  Procedure(s) Performed: Procedure(s) (LRB): EXCISION OF LEFT CALCANEOUS BONE SPUR AT ANTERIOR PROCESS (Left)  Patient location during evaluation: PACU Anesthesia Type: General Level of consciousness: awake and alert Pain management: pain level controlled Vital Signs Assessment: post-procedure vital signs reviewed and stable Respiratory status: spontaneous breathing, nonlabored ventilation and respiratory function stable Cardiovascular status: blood pressure returned to baseline and stable Postop Assessment: no signs of nausea or vomiting Anesthetic complications: no    Last Vitals:  Vitals:   10/30/15 1115 10/30/15 1200  BP: (!) 149/97 (!) 148/80  Pulse: 62 62  Resp: 15 16  Temp:  36.5 C    Last Pain:  Vitals:   10/30/15 1140  TempSrc:   PainSc: 0-No pain                 Courtney Joseph A

## 2015-10-30 NOTE — Anesthesia Procedure Notes (Signed)
Procedure Name: LMA Insertion Date/Time: 10/30/2015 9:50 AM Performed by: Lenny Bouchillon D Pre-anesthesia Checklist: Patient identified, Emergency Drugs available, Suction available and Patient being monitored Patient Re-evaluated:Patient Re-evaluated prior to inductionOxygen Delivery Method: Circle system utilized Preoxygenation: Pre-oxygenation with 100% oxygen Intubation Type: IV induction Ventilation: Mask ventilation without difficulty LMA: LMA inserted LMA Size: 4.0 Number of attempts: 1 Airway Equipment and Method: Bite block Placement Confirmation: positive ETCO2 Tube secured with: Tape Dental Injury: Teeth and Oropharynx as per pre-operative assessment

## 2015-10-30 NOTE — Transfer of Care (Signed)
Immediate Anesthesia Transfer of Care Note  Patient: Courtney Joseph  Procedure(s) Performed: Procedure(s): EXCISION OF LEFT CALCANEOUS BONE SPUR AT ANTERIOR PROCESS (Left)  Patient Location: PACU  Anesthesia Type:GA combined with regional for post-op pain  Level of Consciousness: awake, alert , oriented and patient cooperative  Airway & Oxygen Therapy: Patient Spontanous Breathing and Patient connected to face mask oxygen  Post-op Assessment: Report given to RN and Post -op Vital signs reviewed and stable  Post vital signs: Reviewed and stable  Last Vitals:  Vitals:   10/30/15 0830 10/30/15 0845  BP: 127/76 133/76  Pulse: 80 84  Resp: 11 20  Temp:      Last Pain:  Vitals:   10/30/15 0753  TempSrc: Oral  PainSc: 8       Patients Stated Pain Goal: 2 (85/02/77 4128)  Complications: No apparent anesthesia complications

## 2015-10-30 NOTE — Anesthesia Procedure Notes (Signed)
Anesthesia Regional Block:  Popliteal block  Pre-Anesthetic Checklist: ,, timeout performed, Correct Patient, Correct Site, Correct Laterality, Correct Procedure, Correct Position, site marked, Risks and benefits discussed,  Surgical consent,  Pre-op evaluation,  At surgeon's request and post-op pain management   Prep: chloraprep       Needles:  Injection technique: Single-shot  Needle Type: Echogenic Needle     Needle Length: 5cm 5 cm     Additional Needles:  Procedures: ultrasound guided (picture in chart) Popliteal block Narrative:  Start time: 10/30/2015 8:10 AM End time: 10/30/2015 8:15 AM Injection made incrementally with aspirations every 25 mL.  Performed by: Personally  Anesthesiologist: Reginal Lutes  Additional Notes: Patient tolerated procedure well

## 2015-10-31 ENCOUNTER — Encounter (HOSPITAL_BASED_OUTPATIENT_CLINIC_OR_DEPARTMENT_OTHER): Payer: Self-pay | Admitting: Orthopedic Surgery

## 2015-10-31 NOTE — Op Note (Signed)
NAME:  Courtney Joseph, HERBEL NO.:  MEDICAL RECORD NO.:  [id number]  LOCATION:                                 FACILITY:  PHYSICIAN:  Wylene Simmer, MD        DATE OF BIRTH:  May 05, 1975  DATE OF PROCEDURE:  10/30/2015 DATE OF DISCHARGE:                              OPERATIVE REPORT   PREOPERATIVE DIAGNOSIS:  Left calcaneus fracture nonunion.  POSTOPERATIVE DIAGNOSIS:  Left calcaneus fracture nonunion.  PROCEDURE:  Excision of left calcaneus anterior process nonunion.  SURGEON:  Wylene Simmer, MD.  ASSISTANT:  Mechele Claude, PA-C.  ANESTHESIA:  General, regional.  ESTIMATED BLOOD LOSS:  Minimal.  TOURNIQUET TIME:  22 minutes at 250 mmHg.  COMPLICATIONS:  None apparent.  DISPOSITION:  Extubated, awake, and stable to recovery.  INDICATIONS FOR PROCEDURE:  The patient is a 40 year old woman, who has a history of left ankle injury.  She suffered an anterior process calcaneus fracture.  She has continued to have pain in this area and has tenderness to palpation at the site of the nonunion.  She presents today for surgical treatment of this painful condition.  She understands the risks and benefits of the alternative treatment options and would like to proceed with surgical treatment.  She specifically understands risks of bleeding, infection, nerve damage, blood clots, need for additional surgery, continued pain, amputation, and death.  PROCEDURE IN DETAIL:  After preoperative consent was obtained and the correct operative site was identified, the patient was brought to the operating room and placed supine on the operating table.  General anesthesia was induced.  Preoperative antibiotics were administered. Surgical time-out was taken.  The left lower extremity was prepped and draped in standard sterile fashion with a tourniquet around the calf. The extremity was exsanguinated and a calf tourniquet was inflated to 200 mmHg.  A curvilinear incision was made over  the anterior process of the calcaneus.  Sharp dissection was carried down through skin and subcutaneous tissues.  The interval between the extensor digitorum brevis muscle and the peroneal tendons was then developed.  The muscle belly was elevated from its origin at the anterior process of the calcaneus.  The anterior process was noted to be quite hypertrophic dorsal to the calcaneocuboid joint.  The joint was explored and there was no evidence of loose body or degenerative changes.  No articular cartilage injury was identified.  An oscillating saw was then used to resect the hypertrophic portion of bone.  This appeared to be reactive bone formation at the site of the injury.  The wound was then irrigated copiously.  Gelfoam was placed over the cut surface of bone.  The brevis tendon was then repaired to the periosteum adjacent to the sinus tarsi. Subcutaneous tissues were approximated with inverted simple sutures of 3- 0 Monocryl, skin incision was closed with a running 4-0 nylon.  Sterile dressings were applied, followed by a compression wrap.  Tourniquet was released after application of the dressings at 22 minutes.  The patient was awakened from anesthesia and transported to the recovery room in stable condition.  FOLLOWUP PLAN:  The patient will be weightbearing as tolerated  in a CAM walker boot.  She will follow up with me in the office in 2 weeks for suture removal and to initiate physical therapy.  Mechele Claude, PA-C, was present and scrubbed for the duration of the case.  His assistance was essential in positioning the patient, prepping and draping, gaining and maintaining exposure, performing the operation, closing and dressing the wounds, and applying the boot.     Wylene Simmer, MD     JH/MEDQ  D:  10/30/2015  T:  10/31/2015  Job:  [id number]

## 2015-11-27 ENCOUNTER — Ambulatory Visit: Payer: BC Managed Care – PPO | Admitting: Physical Therapy

## 2016-03-04 ENCOUNTER — Encounter: Payer: Self-pay | Admitting: Internal Medicine

## 2016-04-01 ENCOUNTER — Ambulatory Visit (INDEPENDENT_AMBULATORY_CARE_PROVIDER_SITE_OTHER): Payer: BC Managed Care – PPO | Admitting: Pediatrics

## 2016-04-01 ENCOUNTER — Encounter: Payer: Self-pay | Admitting: Pediatrics

## 2016-04-01 VITALS — BP 136/86 | HR 74 | Temp 100.3°F | Ht 67.0 in | Wt 195.6 lb

## 2016-04-01 DIAGNOSIS — J069 Acute upper respiratory infection, unspecified: Secondary | ICD-10-CM

## 2016-04-01 DIAGNOSIS — F329 Major depressive disorder, single episode, unspecified: Secondary | ICD-10-CM

## 2016-04-01 DIAGNOSIS — R6889 Other general symptoms and signs: Secondary | ICD-10-CM | POA: Diagnosis not present

## 2016-04-01 DIAGNOSIS — F32A Depression, unspecified: Secondary | ICD-10-CM

## 2016-04-01 LAB — VERITOR FLU A/B WAIVED
INFLUENZA B: NEGATIVE
Influenza A: NEGATIVE

## 2016-04-01 NOTE — Progress Notes (Signed)
  Subjective:   Patient ID: Courtney Joseph, female    DOB: February 25, 1975, 41 y.o.   MRN: [id number] CC: New Patient (Initial Visit) (Cough, sore throat, green mucus, Fever)  HPI: Courtney Joseph is a 41 y.o. female presenting for New Patient (Initial Visit) (Cough, sore throat, green mucus, Fever)  Slightly better symptoms today Sounds better throughout the day, lots of coughing in the morning Using mucinex at home, helping some Appetite ok Temp on Saturday  Post nasal drip and coughing bother her the most Tested negative for strep 4 days ago  Last pap smear: Dec 2016, normal Done with gynecology  Depression: on lexapro, thinks has been working well  Past Medical History:  Diagnosis Date  . Anxiety   . Bone spur   . Chronic kidney disease    h/o kidney stones  . Depression   . Headache    migraines   Family History  Problem Relation Age of Onset  . Hypertension Mother   . Depression Father   . Hypertension Father   . Depression Brother   . Learning disabilities Brother   . Cancer Maternal Aunt   . Diabetes Paternal Uncle   . Early death Maternal Grandfather   . Heart disease Maternal Grandfather   . Cancer Paternal Grandmother   . Depression Paternal Grandmother   . Diabetes Paternal Grandmother   . Heart disease Paternal Grandfather    Social History   Social History  . Marital status: Divorced    Spouse name: N/A  . Number of children: N/A  . Years of education: N/A   Social History Main Topics  . Smoking status: Former Smoker    Quit date: 10/15/2011  . Smokeless tobacco: Never Used  . Alcohol use Yes     Comment: Very RARE  . Drug use: No  . Sexual activity: No   Other Topics Concern  . None   Social History Narrative  . None   ROS: All systems negative other than what is in HPI  Objective:    BP 136/86   Pulse 74   Temp 100.3 F (37.9 C) (Oral)   Ht 5' 7"  (1.702 m)   Wt 195 lb 9.6 oz (88.7 kg)   BMI 30.64 kg/m   Wt Readings from Last 3  Encounters:  04/01/16 195 lb 9.6 oz (88.7 kg)  10/30/15 193 lb (87.5 kg)    Gen: NAD, alert, cooperative with exam, NCAT EYES: EOMI, no conjunctival injection, or no icterus ENT:  TMs pearly gray with clear fluid behind them b/l, OP with mild erythema  LYMPH: no cervical LAD CV: NRRR, normal S1/S2, no murmur, distal pulses 2+ b/l Resp: CTABL, no wheezes, normal WOB Ext: No edema, warm Neuro: Alert and oriented, strength equal b/l UE and LE, coordination grossly normal MSK: normal muscle bulk  Assessment & Plan:  Courtney Joseph was seen today for new patient (initial visit).  Diagnoses and all orders for this visit:  Acute URI Flu swab neg Strep neg earlier this week Likely viral Discussed symptom care, return precautions  Flu-like symptoms -     Veritor Flu A/B Waived  Depression, unspecified depression type Stable, cont lexapro  Follow up plan: As needed Courtney Found, MD [place]

## 2016-04-01 NOTE — Patient Instructions (Signed)
Netipot with distilled water 2-3 times a day to clear out sinuses Or Normal saline nasal spray Flonase steroid nasal spray Antihistamine daily such as cetirizine Tylenol prn Lots of fluids

## 2016-04-14 ENCOUNTER — Ambulatory Visit: Payer: BC Managed Care – PPO | Admitting: Internal Medicine

## 2016-05-14 ENCOUNTER — Encounter: Payer: Self-pay | Admitting: Internal Medicine

## 2016-05-14 ENCOUNTER — Other Ambulatory Visit (INDEPENDENT_AMBULATORY_CARE_PROVIDER_SITE_OTHER): Payer: BC Managed Care – PPO

## 2016-05-14 ENCOUNTER — Ambulatory Visit (INDEPENDENT_AMBULATORY_CARE_PROVIDER_SITE_OTHER): Payer: BC Managed Care – PPO | Admitting: Internal Medicine

## 2016-05-14 ENCOUNTER — Encounter (INDEPENDENT_AMBULATORY_CARE_PROVIDER_SITE_OTHER): Payer: Self-pay

## 2016-05-14 VITALS — BP 128/78 | HR 76 | Ht 67.0 in | Wt 196.0 lb

## 2016-05-14 DIAGNOSIS — K5900 Constipation, unspecified: Secondary | ICD-10-CM

## 2016-05-14 DIAGNOSIS — R1013 Epigastric pain: Secondary | ICD-10-CM | POA: Diagnosis not present

## 2016-05-14 DIAGNOSIS — K219 Gastro-esophageal reflux disease without esophagitis: Secondary | ICD-10-CM

## 2016-05-14 DIAGNOSIS — R197 Diarrhea, unspecified: Secondary | ICD-10-CM

## 2016-05-14 DIAGNOSIS — R194 Change in bowel habit: Secondary | ICD-10-CM

## 2016-05-14 LAB — LIPASE: Lipase: 13 U/L (ref 11.0–59.0)

## 2016-05-14 LAB — CBC WITH DIFFERENTIAL/PLATELET
Basophils Absolute: 0.1 10*3/uL (ref 0.0–0.1)
Basophils Relative: 0.6 % (ref 0.0–3.0)
Eosinophils Absolute: 0.2 10*3/uL (ref 0.0–0.7)
Eosinophils Relative: 2.3 % (ref 0.0–5.0)
HCT: 38 % (ref 36.0–46.0)
Hemoglobin: 12.6 g/dL (ref 12.0–15.0)
LYMPHS ABS: 2.2 10*3/uL (ref 0.7–4.0)
Lymphocytes Relative: 26.7 % (ref 12.0–46.0)
MCHC: 33.1 g/dL (ref 30.0–36.0)
MCV: 80.3 fl (ref 78.0–100.0)
MONOS PCT: 7.7 % (ref 3.0–12.0)
Monocytes Absolute: 0.6 10*3/uL (ref 0.1–1.0)
NEUTROS ABS: 5.3 10*3/uL (ref 1.4–7.7)
NEUTROS PCT: 62.7 % (ref 43.0–77.0)
PLATELETS: 351 10*3/uL (ref 150.0–400.0)
RBC: 4.72 Mil/uL (ref 3.87–5.11)
RDW: 14.8 % (ref 11.5–15.5)
WBC: 8.4 10*3/uL (ref 4.0–10.5)

## 2016-05-14 LAB — COMPREHENSIVE METABOLIC PANEL
ALT: 19 U/L (ref 0–35)
AST: 16 U/L (ref 0–37)
Albumin: 4.2 g/dL (ref 3.5–5.2)
Alkaline Phosphatase: 71 U/L (ref 39–117)
BUN: 8 mg/dL (ref 6–23)
CO2: 28 meq/L (ref 19–32)
Calcium: 9.4 mg/dL (ref 8.4–10.5)
Chloride: 104 mEq/L (ref 96–112)
Creatinine, Ser: 0.62 mg/dL (ref 0.40–1.20)
GFR: 111.51 mL/min (ref >60.00)
GLUCOSE: 108 mg/dL — AB (ref 70–99)
POTASSIUM: 3.6 meq/L (ref 3.5–5.1)
Sodium: 139 mEq/L (ref 135–145)
Total Bilirubin: 0.4 mg/dL (ref 0.2–1.2)
Total Protein: 7.6 g/dL (ref 6.0–8.3)

## 2016-05-14 MED ORDER — OMEPRAZOLE 40 MG PO CPDR: 40.0000 mg | DELAYED_RELEASE_CAPSULE | Freq: Every day | ORAL | 11 refills | Status: DC

## 2016-05-14 NOTE — Patient Instructions (Addendum)
Your physician has requested that you go to the basement for the following lab work before leaving today:  CBC, CMET, Lipase  We have sent the following medications to your pharmacy for you to pick up at your convenience:  Omeprazole  Starting taking 2 tablespoons of Metamucil daily  Please follow up on 07/19/2016 at 10:00am

## 2016-05-14 NOTE — Progress Notes (Signed)
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, with history of anxiety, depression, and chronic headaches who is self-referred with multiple GI complaints including abdominal pain, nausea, and irregular bowel habits. She brings with her outside records from a previous gastroenterologist Dr. Doristine Mango. I have reviewed these records. She was evaluated by Dr. Britta Mccreedy for several issues including fatty liver on imaging, abdominal pain, and irregular bowel movements. She did undergo an upper endoscopy was found to have gastric erosions for which she was told to avoid NSAIDs. Had been on a PPI for a period of time which seemed to help. No longer taking PPI due to insurance formulary issues. Patient currently reports upper abdominal pain and bloating with distention. She also mentions foul-smelling flatus. Problems with abdominal pain at proximally 5 out of every 7 days. She does have intermittent reflux symptoms. She has been avoiding NSAIDs. No dysphagia. Occasional chest discomfort which she has had in the past. Nausea, typically at night. She has gained 10 pounds over the past year. Her bowel habits chronically alternating with diarrhea predominating. No bleeding. No family history of inflammatory bowel disease or colon cancer.  REVIEW OF SYSTEMS:  All non-GI ROS negative unless otherwise specified in history of present illness except for anxiety, depression, fatigue, headaches, occasional irregular heartbeat, night sweats, sleeping problems, urinary leakage  Past Medical History:  Diagnosis Date  . Anxiety   . Bone spur   . Chronic kidney disease    h/o kidney stones  . Depression   . Fatty liver   . Gastric erosions   . Headache    migraines    Past Surgical History:  Procedure Laterality Date  . BONE EXCISION Left 10/30/2015   Procedure: EXCISION OF LEFT CALCANEOUS BONE SPUR AT ANTERIOR PROCESS;  Surgeon: Wylene Simmer, MD;  Location: [facility];  Service: Orthopedics;  Laterality: Left;  . uterine ablation    . WISDOM TOOTH EXTRACTION      Social History Courtney Joseph  reports that she quit smoking about 4 years ago. She has never used smokeless tobacco. She reports that she drinks alcohol. She reports that she does not use drugs.  family history includes Cancer in her maternal aunt and paternal grandmother; Depression in her brother, father, and paternal grandmother; Diabetes in her paternal grandmother and paternal uncle; Early death in her maternal grandfather; Heart disease in her maternal grandfather and paternal grandfather; Hypertension in her father and mother; Learning disabilities in her brother.  Allergies  Allergen Reactions  . Other Anaphylaxis    Red meat, pork, lamb, deer meat  . Erythromycin Hives  . Keflex [Cephalexin] Hives  . Nsaids     Has gastric ulcer  . Penicillins Hives  . Sulfa Antibiotics Hives       PHYSICAL EXAMINATION: Vital signs: BP 128/78   Pulse 76   Ht 5' 7"  (1.702 m)   Wt 196 lb (88.9 kg)   SpO2 96%   BMI 30.70 kg/m   Constitutional: Pleasant, obese, generally well-appearing, no acute distress Psychiatric: alert and oriented x3, cooperative Eyes: extraocular movements intact, anicteric, conjunctiva pink Mouth: oral pharynx moist, no lesions Neck: suppleWithout thyromegaly Lymph: no lymphadenopathy Cardiovascular: heart regular rate and rhythm, no murmur Lungs: clear to auscultation bilaterally Abdomen: soft, nontender, nondistended, no obvious ascites, no peritoneal signs, normal bowel sounds, no organomegaly Rectal: Omitted Extremities: no clubbing cyanosis or lower extremity edema bilaterally Skin: no lesions on visible extremities  Neuro: No focal deficits. Cranial nerves intact. No asterixis.    ASSESSMENT:  #1. Epigastric pain. Possibly GERD. Consider nonulcer dyspepsia #2. GERD symptoms intermittently. May explain  upper abdominal pain #3. Chronic alternating bowel habits and bloating consistent with irritable bowel syndrome. Diarrhea predominates #4. Previous upper endoscopy elsewhere with gastric erosions in the face of NSAID use. No Helicobacter pylori #5. History of fatty liver on imaging. Remote liver tests normal   PLAN:  #1. Reflux precautions. Importance of weight loss stressed. Literature provided #2. Prescribe omeprazole 40 mg daily #3. Initiate Metamucil 2 tablespoons daily for alternating bowel habits #4. Screening laboratories including CBC, comprehensive metabolic panel, and lipase #5. Office follow-up in 8 weeks. If symptoms persist despite the above measures, consider endoscopic evaluations.

## 2016-07-19 ENCOUNTER — Ambulatory Visit: Payer: BC Managed Care – PPO | Admitting: Internal Medicine

## 2016-08-10 ENCOUNTER — Encounter (INDEPENDENT_AMBULATORY_CARE_PROVIDER_SITE_OTHER): Payer: Self-pay

## 2016-08-10 ENCOUNTER — Ambulatory Visit (INDEPENDENT_AMBULATORY_CARE_PROVIDER_SITE_OTHER): Payer: BC Managed Care – PPO | Admitting: Internal Medicine

## 2016-08-10 ENCOUNTER — Encounter: Payer: Self-pay | Admitting: Internal Medicine

## 2016-08-10 VITALS — BP 120/70 | HR 68 | Ht 67.0 in | Wt 199.0 lb

## 2016-08-10 DIAGNOSIS — K219 Gastro-esophageal reflux disease without esophagitis: Secondary | ICD-10-CM | POA: Diagnosis not present

## 2016-08-10 DIAGNOSIS — K589 Irritable bowel syndrome without diarrhea: Secondary | ICD-10-CM

## 2016-08-10 NOTE — Patient Instructions (Signed)
Continue Omeprazole and Metamucil.

## 2016-08-10 NOTE — Progress Notes (Signed)
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 Previous GI evaluation elsewhere as outlined. See that dictation for details. She was felt to have epigastric pain possibly related to GERD versus nonulcer dyspepsia, intermittent GERD symptoms, alternating bowel habits and bloating consistent with IBS, and a history of gastric erosions secondary to NSAID use without H. pylori elsewhere. Also fatty liver on imaging with normal liver tests. Repeat laboratories that they including CBC, comprehensive metabolic panel, and lipase were unremarkable. She was advised with regards to reflux precautions including weight loss. She was prescribed omeprazole 40 mg daily and told to initiate Metamucil 2 tablespoons daily. Routine follow-up at this time requested. She is pleased to report that her bowel habits have improved significantly. Loraine Grip fluctuation. Also, her epigastric discomfort has improved and her classic reflux symptoms resolved. No new complaints or other problems. We reviewed her workup.  REVIEW OF SYSTEMS:  All non-GI ROS negative except for anxiety, sinus trouble, back pain, depression, fatigue, headaches, night sweats, sleeping problems  Past Medical History:  Diagnosis Date  . Anxiety   . Bone spur   . Chronic kidney disease    h/o kidney stones  . Depression   . Fatty liver   . Gastric erosions   . Headache    migraines    Past Surgical History:  Procedure Laterality Date  . BONE EXCISION Left 10/30/2015   Procedure: EXCISION OF LEFT CALCANEOUS BONE SPUR AT ANTERIOR PROCESS;  Surgeon: Wylene Simmer, MD;  Location: [facility];  Service: Orthopedics;  Laterality: Left;  . uterine ablation    . WISDOM TOOTH  EXTRACTION      Social History Courtney Joseph  reports that she quit smoking about 4 years ago. She has never used smokeless tobacco. She reports that she drinks alcohol. She reports that she does not use drugs.  family history includes Cancer in her maternal aunt and paternal grandmother; Depression in her brother, father, and paternal grandmother; Diabetes in her paternal grandmother and paternal uncle; Early death in her maternal grandfather; Heart disease in her maternal grandfather and paternal grandfather; Hypertension in her father and mother; Learning disabilities in her brother.  Allergies  Allergen Reactions  . Other Anaphylaxis    Red meat, pork, lamb, deer meat  . Erythromycin Hives  . Keflex [Cephalexin] Hives  . Nsaids     Has gastric ulcer  . Penicillins Hives  . Sulfa Antibiotics Hives       PHYSICAL EXAMINATION: Vital signs: BP 120/70   Pulse 68   Ht 5' 7"  (1.702 m)   Wt 199 lb (90.3 kg)   BMI 31.17 kg/m   Constitutional: generally well-appearing, no acute distress Psychiatric: alert and oriented x3, cooperative Eyes: extraocular movements intact, anicteric, conjunctiva pink Mouth: oral pharynx moist, no lesions Neck: supple no lymphadenopathy Cardiovascular: heart regular rate and rhythm, no murmur Lungs: clear to auscultation bilaterally Abdomen: soft, nontender, nondistended, no obvious ascites, no peritoneal signs, normal bowel sounds, no organomegaly Rectal: Omitted Extremities: no clubbing cyanosis or lower extremity edema bilaterally Skin: no lesions on visible extremities Neuro: No focal deficits. Cranial nerves intact  ASSESSMENT:  #1. GERD. Improved on PPI #2. Epigastric pain. Improved on PPI. Presumably secondary to GERD #3. Alternating bowel habits and bloating consistent with IBS. Improved on  fiber #4. Fatty liver on imaging. Normal LFTs #5. Obesity   PLAN:  #1. Exercise and weight loss #2. Reflux precautions #3. Continue PPI #4.  Continue daily fiber #5. Routine office follow-up (GI) in 6 months. Sooner if needed #6. Ongoing general medical care with PCP  15 minutes spent face-to-face with the patient. Greater than 50% a time use for counseling regarding her GI diagnoses and the recommended treatment including follow-up

## 2016-08-13 ENCOUNTER — Ambulatory Visit: Payer: BC Managed Care – PPO | Admitting: Pediatrics

## 2016-08-23 ENCOUNTER — Encounter: Payer: Self-pay | Admitting: Pediatrics

## 2016-08-23 ENCOUNTER — Ambulatory Visit (INDEPENDENT_AMBULATORY_CARE_PROVIDER_SITE_OTHER): Payer: BC Managed Care – PPO | Admitting: Pediatrics

## 2016-08-23 VITALS — BP 107/70 | HR 70 | Temp 98.4°F | Ht 67.0 in | Wt 196.6 lb

## 2016-08-23 DIAGNOSIS — G43809 Other migraine, not intractable, without status migrainosus: Secondary | ICD-10-CM

## 2016-08-23 DIAGNOSIS — J3089 Other allergic rhinitis: Secondary | ICD-10-CM

## 2016-08-23 DIAGNOSIS — G8929 Other chronic pain: Secondary | ICD-10-CM

## 2016-08-23 DIAGNOSIS — M25572 Pain in left ankle and joints of left foot: Secondary | ICD-10-CM

## 2016-08-23 DIAGNOSIS — K219 Gastro-esophageal reflux disease without esophagitis: Secondary | ICD-10-CM | POA: Diagnosis not present

## 2016-08-23 DIAGNOSIS — F329 Major depressive disorder, single episode, unspecified: Secondary | ICD-10-CM

## 2016-08-23 DIAGNOSIS — F32A Depression, unspecified: Secondary | ICD-10-CM

## 2016-08-23 MED ORDER — ESCITALOPRAM OXALATE 20 MG PO TABS
20.0000 mg | ORAL_TABLET | Freq: Every day | ORAL | 3 refills | Status: DC
Start: 2016-08-23 — End: 2016-10-14

## 2016-08-23 MED ORDER — CETIRIZINE HCL 10 MG PO TABS: 10.0000 mg | ORAL_TABLET | Freq: Every day | ORAL | 11 refills | Status: DC

## 2016-08-23 MED ORDER — ACETAMINOPHEN-CODEINE #3 300-30 MG PO TABS: 1.0000 | ORAL_TABLET | Freq: Four times a day (QID) | ORAL | 0 refills | Status: DC | PRN

## 2016-08-23 MED ORDER — FLUTICASONE PROPIONATE 50 MCG/ACT NA SUSP: 2.0000 | Freq: Every day | NASAL | 6 refills | Status: DC

## 2016-08-23 MED ORDER — OMEPRAZOLE 40 MG PO CPDR: 40.0000 mg | DELAYED_RELEASE_CAPSULE | Freq: Every day | ORAL | 11 refills | Status: DC

## 2016-08-23 MED ORDER — RIZATRIPTAN BENZOATE 10 MG PO TBDP
10.0000 mg | ORAL_TABLET | ORAL | 0 refills | Status: DC | PRN
Start: 2016-08-23 — End: 2016-10-04

## 2016-08-23 MED ORDER — TOPIRAMATE 25 MG PO CPSP: ORAL_CAPSULE | ORAL | 1 refills | Status: DC

## 2016-08-23 NOTE — Progress Notes (Signed)
Subjective:   Patient ID: Courtney Joseph, female    DOB: 1975/10/30, 41 y.o.   MRN: [id number] CC: Medication Refill  HPI: Courtney Joseph is a 41 y.o. female presenting for Medication Refill  On lexapro for anxiety and depression Work is stressful, daughter in her senior year HS, doing great, mom sad she is growing up Says mood has been ok though  Has chronic pain in L ankle following injury and surgery Takes codeine when needed, not every day, previously getting from prior PCP  Has migraines 3-4 times a week Feels nauseous, photophobia, wants to lie down in dark quiet room  Improves sometimes after a few hours, sometimes takes a day Never been on ppx medicine  Tried triptan in the past, not recently  Had pap smear Dec 2016 with Dr. Irven [place] Mammogram last year  Relevant past medical, surgical, family and social history reviewed. Allergies and medications reviewed and updated. History  Smoking Status  . Former Smoker  . Quit date: 10/15/2011  Smokeless Tobacco  . Never Used   ROS: Per HPI   Objective:    BP 107/70   Pulse 70   Temp 98.4 F (36.9 C) (Oral)   Ht 5' 7"  (1.702 m)   Wt 196 lb 9.6 oz (89.2 kg)   BMI 30.79 kg/m   Wt Readings from Last 3 Encounters:  08/23/16 196 lb 9.6 oz (89.2 kg)  08/10/16 199 lb (90.3 kg)  05/14/16 196 lb (88.9 kg)    Gen: NAD, alert, cooperative with exam, NCAT EYES: EOMI, no conjunctival injection, or no icterus ENT:OP without erythema CV: NRRR, normal S1/S2, no murmur, distal pulses 2+ b/l Resp: CTABL, no wheezes, normal WOB Abd: +BS, soft, NTND.  Ext: No edema, warm Neuro: Alert and oriented, strength equal b/l UE and LE, coordination grossly normal MSK: normal muscle bulk Psych: normal affect  Assessment & Plan:  Ziaire was seen today for medication refill.  Diagnoses and all orders for this visit:  Other migraine without status migrainosus, not intractable Start ppx and abortive med as below No abortive med more than  2x/week -     rizatriptan (MAXALT-MLT) 10 MG disintegrating tablet; Take 1 tablet (10 mg total) by mouth as needed for migraine. May repeat in 2 hours if needed -     topiramate (TOPAMAX) 25 MG capsule; 32m at night x 1 week, then 25BID x 1 week, then 267mqhs x 1 week, 50 in am, then 508mID  Gastroesophageal reflux disease, esophagitis presence not specified Stable, cont below -     omeprazole (PRILOSEC) 40 MG capsule; Take 1 capsule (40 mg total) by mouth daily.  Chronic pain of left ankle Ongoing ysmptoms, will rtc within 2 weeks for pain management appt -     acetaminophen-codeine (TYLENOL #3) 300-30 MG tablet; Take 1 tablet by mouth every 6 (six) hours as needed for moderate pain.  Allergic rhinitis due to other allergic trigger, unspecified seasonality Start below, also use neti pot prn -     cetirizine (ZYRTEC) 10 MG tablet; Take 1 tablet (10 mg total) by mouth daily. -     fluticasone (FLONASE) 50 MCG/ACT nasal spray; Place 2 sprays into both nostrils daily.  Depression, unspecified depression type Symptoms stable, cont below, stress relief -     escitalopram (LEXAPRO) 20 MG tablet; Take 1 tablet (20 mg total) by mouth daily.   Follow up plan: Return in about 2 weeks (around 09/06/2016) for pain contract. CarAssunta FoundD [facility]

## 2016-09-23 ENCOUNTER — Ambulatory Visit: Payer: BC Managed Care – PPO | Admitting: Pediatrics

## 2016-10-04 ENCOUNTER — Ambulatory Visit (INDEPENDENT_AMBULATORY_CARE_PROVIDER_SITE_OTHER): Payer: BC Managed Care – PPO | Admitting: Pediatrics

## 2016-10-04 ENCOUNTER — Encounter: Payer: Self-pay | Admitting: Pediatrics

## 2016-10-04 VITALS — BP 115/70 | HR 64 | Temp 98.1°F | Ht 67.0 in | Wt 199.0 lb

## 2016-10-04 DIAGNOSIS — R202 Paresthesia of skin: Secondary | ICD-10-CM

## 2016-10-04 DIAGNOSIS — F32A Depression, unspecified: Secondary | ICD-10-CM

## 2016-10-04 DIAGNOSIS — G43809 Other migraine, not intractable, without status migrainosus: Secondary | ICD-10-CM | POA: Diagnosis not present

## 2016-10-04 DIAGNOSIS — F329 Major depressive disorder, single episode, unspecified: Secondary | ICD-10-CM

## 2016-10-04 MED ORDER — VENLAFAXINE HCL ER 37.5 MG PO CP24: 37.5000 mg | ORAL_CAPSULE | Freq: Every day | ORAL | 3 refills | Status: DC

## 2016-10-04 MED ORDER — RIZATRIPTAN BENZOATE 10 MG PO TBDP: 10.0000 mg | ORAL_TABLET | ORAL | 0 refills | Status: DC | PRN

## 2016-10-04 NOTE — Patient Instructions (Signed)
Take 22m topamax twice a day for 4 days then stop Take half tab of lexapro for 8 days then stop After stopping lexapro start venlafaxine 37.569monce a day Can double dose after a week if needed, let me know, I will need to send in refills

## 2016-10-04 NOTE — Progress Notes (Signed)
  Subjective:   Patient ID: Courtney Joseph, female    DOB: [birth year], 41 y.o.   MRN: [id number] CC: Follow-up multiple med problems  HPI: Courtney Joseph is a 41 y.o. female presenting for Follow-up here today with daughter kirsten   Tingling in hands and feet comes and goes, mostly in the morning Away by 2-3 oclock in afternoon Thinks started soon after starting topamax  Migraines: happening every day topamax might have helped some, but she isnt sure  Ongoing mood problems, has been down Feels safe at home, no thoughts of self harm Has been on lexapro Doesn't think it has been helping  Relevant past medical, surgical, family and social history reviewed. Allergies and medications reviewed and updated. History  Smoking Status  . Former Smoker  . Quit date: 10/15/2011  Smokeless Tobacco  . Never Used   ROS: Per HPI   Objective:    BP 115/70   Pulse 64   Temp 98.1 F (36.7 C) (Oral)   Ht 5' 7"  (1.702 m)   Wt 199 lb (90.3 kg)   BMI 31.17 kg/m   Wt Readings from Last 3 Encounters:  10/04/16 199 lb (90.3 kg)  08/23/16 196 lb 9.6 oz (89.2 kg)  08/10/16 199 lb (90.3 kg)    Gen: NAD, alert, cooperative with exam, NCAT EYES: EOMI, no conjunctival injection, or no icterus ENT:  TMs pearly gray b/l, OP without erythema LYMPH: no cervical LAD CV: NRRR, normal S1/S2, no murmur, distal pulses 2+ b/l Resp: CTABL, no wheezes, normal WOB Abd: +BS, soft, NTND.  Ext: No edema, warm Neuro: Alert and oriented, strength equal b/l UE and LE, coordination grossly normal MSK: normal muscle bulk  Assessment & Plan:  [name] was seen today for follow-up multiple med problems  Diagnoses and all orders for this visit:  Tingling in extremities Symmetric, comes and goes Check labs Stop topamax Any worsening let me know -     CBC with Differential/Platelet -     CMP14+EGFR -     TSH -     Folate -     Vitamin B12  Depression, unspecified depression type Ongoing symptoms, feels  safe at home Wean off lexapro, start effexor -     venlafaxine XR (EFFEXOR-XR) 37.5 MG 24 hr capsule; Take 1 capsule (37.5 mg total) by mouth daily with breakfast.  Other migraine without status migrainosus, not intractable Daily headaches, similar to normal headaches maxalt prn, abortive meds no more than 2 times a week Starting effexor -     rizatriptan (MAXALT-MLT) 10 MG disintegrating tablet; Take 1 tablet (10 mg total) by mouth as needed for migraine. May repeat in 2 hours if needed -     venlafaxine XR (EFFEXOR-XR) 37.5 MG 24 hr capsule; Take 1 capsule (37.5 mg total) by mouth daily with breakfast.   Follow up plan: Return in about 4 weeks (around 11/01/2016). [name], MD [place]

## 2016-10-05 LAB — CBC WITH DIFFERENTIAL/PLATELET
BASOS: 1 %
Basophils Absolute: 0.1 10*3/uL (ref 0.0–0.2)
EOS (ABSOLUTE): 0.2 10*3/uL (ref 0.0–0.4)
Eos: 2 %
HEMATOCRIT: 37.4 % (ref 34.0–46.6)
HEMOGLOBIN: 12.1 g/dL (ref 11.1–15.9)
IMMATURE GRANS (ABS): 0 10*3/uL (ref 0.0–0.1)
IMMATURE GRANULOCYTES: 0 %
LYMPHS ABS: 2.6 10*3/uL (ref 0.7–3.1)
Lymphs: 33 %
MCH: 25.7 pg — ABNORMAL LOW (ref 26.6–33.0)
MCHC: 32.4 g/dL (ref 31.5–35.7)
MCV: 79 fL (ref 79–97)
MONOS ABS: 0.7 10*3/uL (ref 0.1–0.9)
Monocytes: 9 %
NEUTROS ABS: 4.4 10*3/uL (ref 1.4–7.0)
NEUTROS PCT: 55 %
Platelets: 366 10*3/uL (ref 150–379)
RBC: 4.71 x10E6/uL (ref 3.77–5.28)
RDW: 14.4 % (ref 12.3–15.4)
WBC: 7.9 10*3/uL (ref 3.4–10.8)

## 2016-10-05 LAB — CMP14+EGFR
A/G RATIO: 1.6 (ref 1.2–2.2)
ALT: 16 IU/L (ref 0–32)
AST: 14 IU/L (ref 0–40)
Albumin: 4.6 g/dL (ref 3.5–5.5)
Alkaline Phosphatase: 77 IU/L (ref 39–117)
BUN/Creatinine Ratio: 13 (ref 9–23)
BUN: 9 mg/dL (ref 6–24)
Bilirubin Total: 0.2 mg/dL (ref 0.0–1.2)
CALCIUM: 9.1 mg/dL (ref 8.7–10.2)
CO2: 20 mmol/L (ref 20–29)
Chloride: 103 mmol/L (ref 96–106)
Creatinine, Ser: 0.71 mg/dL (ref 0.57–1.00)
GFR, EST AFRICAN AMERICAN: 121 mL/min/{1.73_m2} (ref >59)
GFR, EST NON AFRICAN AMERICAN: 105 mL/min/{1.73_m2} (ref >59)
GLOBULIN, TOTAL: 2.8 g/dL (ref 1.5–4.5)
Glucose: 93 mg/dL (ref 65–99)
Potassium: 3.7 mmol/L (ref 3.5–5.2)
SODIUM: 137 mmol/L (ref 134–144)
TOTAL PROTEIN: 7.4 g/dL (ref 6.0–8.5)

## 2016-10-05 LAB — TSH: TSH: 0.83 u[IU]/mL (ref 0.450–4.500)

## 2016-10-05 LAB — FOLATE: FOLATE: 12 ng/mL (ref >3.0)

## 2016-10-05 LAB — VITAMIN B12: VITAMIN B 12: 185 pg/mL — AB (ref 232–1245)

## 2016-10-11 ENCOUNTER — Telehealth: Payer: Self-pay | Admitting: *Deleted

## 2016-10-11 DIAGNOSIS — F32A Depression, unspecified: Secondary | ICD-10-CM

## 2016-10-11 DIAGNOSIS — F329 Major depressive disorder, single episode, unspecified: Secondary | ICD-10-CM

## 2016-10-11 NOTE — Telephone Encounter (Signed)
Patient called stating that she does not like new medication (Effexor).  She feels that she is staying angry and would like to switch back to lexapro

## 2016-10-14 MED ORDER — ESCITALOPRAM OXALATE 20 MG PO TABS
20.0000 mg | ORAL_TABLET | Freq: Every day | ORAL | 3 refills | Status: DC
Start: 2016-10-14 — End: 2017-08-31

## 2016-10-14 NOTE — Addendum Note (Signed)
Addended by: Eustaquio Maize on: 10/14/2016 12:12 PM   Modules accepted: Orders

## 2016-10-14 NOTE — Telephone Encounter (Signed)
Result note now in, thanks

## 2016-10-14 NOTE — Telephone Encounter (Signed)
Patient aware of lab results.

## 2016-10-14 NOTE — Telephone Encounter (Signed)
Pt notified of recommendation Verbalizes understanding 

## 2016-10-14 NOTE — Telephone Encounter (Signed)
That's fine. Stop effexor Take half tab of lexapro for a week then take full tab I sent in to pharmacy

## 2016-11-05 ENCOUNTER — Other Ambulatory Visit: Payer: Self-pay | Admitting: Pediatrics

## 2016-11-05 DIAGNOSIS — G8929 Other chronic pain: Secondary | ICD-10-CM

## 2016-11-05 DIAGNOSIS — M25572 Pain in left ankle and joints of left foot: Principal | ICD-10-CM

## 2017-01-06 LAB — HM PAP SMEAR: HM PAP: NORMAL

## 2017-01-19 ENCOUNTER — Other Ambulatory Visit: Payer: Self-pay | Admitting: Pediatrics

## 2017-01-19 DIAGNOSIS — G43809 Other migraine, not intractable, without status migrainosus: Secondary | ICD-10-CM

## 2017-04-07 ENCOUNTER — Encounter: Payer: Self-pay | Admitting: Pediatrics

## 2017-04-07 ENCOUNTER — Ambulatory Visit: Payer: BC Managed Care – PPO | Admitting: Pediatrics

## 2017-04-07 VITALS — BP 125/77 | HR 72 | Temp 99.0°F | Ht 67.0 in | Wt 201.2 lb

## 2017-04-07 DIAGNOSIS — H6593 Unspecified nonsuppurative otitis media, bilateral: Secondary | ICD-10-CM | POA: Diagnosis not present

## 2017-04-07 DIAGNOSIS — R040 Epistaxis: Secondary | ICD-10-CM | POA: Diagnosis not present

## 2017-04-07 DIAGNOSIS — R030 Elevated blood-pressure reading, without diagnosis of hypertension: Secondary | ICD-10-CM | POA: Diagnosis not present

## 2017-04-07 NOTE — Progress Notes (Signed)
  Subjective:   Patient ID: Courtney Joseph, female    DOB: 03-20-1975, 42 y.o.   MRN: [id number] CC: Hypertension and Nose Bleeds  HPI: Courtney Joseph is a 42 y.o. female presenting for Hypertension and Nose Bleeds  Blood pressure yesterday up to 170s over 90s.  Had a nosebleed this morning that lasted for a few minutes.  Here today to follow-up in for evaluation.  Not using Flonase at home.  Does not regularly check her blood pressures at home.  No headaches, no chest pain, no shortness of breath.  Relevant past medical, surgical, family and social history reviewed. Allergies and medications reviewed and updated. Social History   Tobacco Use  Smoking Status Former Smoker  . Last attempt to quit: 10/15/2011  . Years since quitting: 5.4  Smokeless Tobacco Never Used   ROS: Per HPI   Objective:    BP 125/77   Pulse 72   Temp 99 F (37.2 C) (Oral)   Ht 5' 7"  (1.702 m)   Wt 201 lb 3.2 oz (91.3 kg)   BMI 31.51 kg/m   Wt Readings from Last 3 Encounters:  04/07/17 201 lb 3.2 oz (91.3 kg)  10/04/16 199 lb (90.3 kg)  08/23/16 196 lb 9.6 oz (89.2 kg)    Gen: NAD, alert, cooperative with exam, NCAT EYES: EOMI, no conjunctival injection, or no icterus ENT:  TMs gray with effusion bilaterally. OP without erythema LYMPH: no cervical LAD CV: NRRR, normal S1/S2, no murmur, distal pulses 2+ b/l Resp: CTABL, no wheezes, normal WOB Ext: No edema, warm Neuro: Alert and oriented, strength equal b/l UE and LE, coordination grossly normal MSK: normal muscle bulk  Assessment & Plan:  Rosalene was seen today for hypertension and nose bleeds.  Diagnoses and all orders for this visit:  Epistaxis One episode.  Also with elevated blood pressures at home.  Normal blood pressure today.  Patient will check blood pressures at home.  Follow-up in 2 weeks with blood pressures from home.  Elevated blood pressure reading  Fluid level behind tympanic membrane of both ears No pain, no trouble hearing.    Follow up plan: Follow-up 2 weeks with blood pressures. [name], MD [place]

## 2017-04-09 ENCOUNTER — Telehealth: Payer: Self-pay | Admitting: Pediatrics

## 2017-04-09 MED ORDER — DOXYCYCLINE HYCLATE 100 MG PO TABS: 100.0000 mg | ORAL_TABLET | Freq: Two times a day (BID) | ORAL | 0 refills | Status: DC

## 2017-04-09 NOTE — Telephone Encounter (Signed)
Pt aware.

## 2017-04-09 NOTE — Telephone Encounter (Signed)
Erythromycin on pt's allergy list. Will send in doxycycline  to pharmacy.

## 2017-05-18 ENCOUNTER — Other Ambulatory Visit: Payer: Self-pay | Admitting: Pediatrics

## 2017-05-18 DIAGNOSIS — G43809 Other migraine, not intractable, without status migrainosus: Secondary | ICD-10-CM

## 2017-06-09 ENCOUNTER — Ambulatory Visit: Payer: BC Managed Care – PPO | Admitting: Physician Assistant

## 2017-08-02 ENCOUNTER — Encounter: Payer: Self-pay | Admitting: Physician Assistant

## 2017-08-02 ENCOUNTER — Ambulatory Visit: Payer: BC Managed Care – PPO | Admitting: Physician Assistant

## 2017-08-02 VITALS — BP 138/88 | HR 59 | Temp 98.2°F | Ht 67.0 in | Wt 196.2 lb

## 2017-08-02 DIAGNOSIS — G43809 Other migraine, not intractable, without status migrainosus: Secondary | ICD-10-CM | POA: Diagnosis not present

## 2017-08-02 DIAGNOSIS — G43909 Migraine, unspecified, not intractable, without status migrainosus: Secondary | ICD-10-CM | POA: Insufficient documentation

## 2017-08-02 MED ORDER — TOPIRAMATE 25 MG PO CPSP: 25.0000 mg | ORAL_CAPSULE | Freq: Two times a day (BID) | ORAL | 5 refills | Status: DC

## 2017-08-02 MED ORDER — PROMETHAZINE HCL 25 MG/ML IJ SOLN
25.0000 mg | Freq: Once | INTRAMUSCULAR | Status: AC
  Administered 2017-08-02: 25 mg via INTRAMUSCULAR

## 2017-08-02 MED ORDER — SUMATRIPTAN SUCCINATE 100 MG PO TABS: 100.0000 mg | ORAL_TABLET | ORAL | 5 refills | Status: DC | PRN

## 2017-08-02 MED ORDER — KETOROLAC TROMETHAMINE 60 MG/2ML IM SOLN
60.0000 mg | Freq: Once | INTRAMUSCULAR | Status: AC
  Administered 2017-08-02: 60 mg via INTRAMUSCULAR

## 2017-08-02 MED ORDER — METHYLPREDNISOLONE ACETATE 80 MG/ML IJ SUSP: 80.0000 mg | Freq: Once | INTRAMUSCULAR | Status: DC

## 2017-08-02 NOTE — Patient Instructions (Signed)
In a few days you may receive a survey in the mail or online from [employer] regarding your visit with [place] today. Please take a moment to fill this out. Your feedback is very important to our whole office. It can help [place] better understand your needs as well as improve your experience and satisfaction. Thank you for taking your time to complete it. We care about you.  Particia Nearing, PA-C

## 2017-08-03 NOTE — Progress Notes (Signed)
BP 138/88   Pulse (!) 59   Temp 98.2 F (36.8 C) (Oral)   Ht 5' 7"  (1.702 m)   Wt 196 lb 3.2 oz (89 kg)   BMI 30.73 kg/m    Subjective:    Patient ID: Courtney Joseph, female    DOB: 1975-06-16, 42 y.o.   MRN: [id number]  HPI: Courtney Joseph is a 42 y.o. female presenting on 08/02/2017 for Headache (since Sunday. It has improved since this morning )  This patient has long-standing migraine issues.  She has tried several medications in the past.  She is even tried Topamax but went up to 100 mg daily she had significant neuropathy in her hands and feet.  Her daughter has done very well on it and she would like to try to get a may be keep it on the lower dose.  I agree.  The headache that she has currently has gone on for several days.  She is just not able to get it completely gone.  She tried to work yesterday and had to leave early when she got up this morning the headache was very severe.  Past Medical History:  Diagnosis Date  . Anxiety   . Bone spur   . Chronic kidney disease    h/o kidney stones  . Depression   . Fatty liver   . Gastric erosions   . Headache    migraines   Relevant past medical, surgical, family and social history reviewed and updated as indicated. Interim medical history since our last visit reviewed. Allergies and medications reviewed and updated. DATA REVIEWED: CHART IN EPIC  Family History reviewed for pertinent findings.  Review of Systems  Constitutional: Positive for fatigue.  HENT: Negative.   Eyes: Negative.   Respiratory: Negative.   Gastrointestinal: Positive for nausea. Negative for vomiting.  Genitourinary: Negative.   Neurological: Positive for headaches. Negative for speech difficulty and weakness.    Allergies as of 08/02/2017      Reactions   Galactose Anaphylaxis   Other Anaphylaxis   Red meat, pork, lamb, deer meat   Sulfasalazine Hives   Erythromycin Hives   Keflex [cephalexin] Hives   Nsaids    Has gastric ulcer   Penicillins Hives   Sulfa Antibiotics Hives   Tolmetin    Has gastric ulcer      Medication List        Accurate as of 08/02/17 11:59 PM. Always use your most recent med list.          acetaminophen-codeine 300-30 MG tablet Commonly known as:  TYLENOL #3 Take 1 tablet by mouth every 6 (six) hours as needed for moderate pain.   ALPRAZolam 0.25 MG tablet Commonly known as:  XANAX Take 0.25 mg by mouth at bedtime as needed for anxiety.   cetirizine 10 MG tablet Commonly known as:  ZYRTEC Take 1 tablet (10 mg total) by mouth daily.   escitalopram 20 MG tablet Commonly known as:  LEXAPRO Take 1 tablet (20 mg total) by mouth daily.   omeprazole 40 MG capsule Commonly known as:  PRILOSEC Take 1 capsule (40 mg total) by mouth daily.   SUMAtriptan 100 MG tablet Commonly known as:  IMITREX Take 1 tablet (100 mg total) by mouth every 2 (two) hours as needed for migraine. May repeat in 2 hours if headache persists or recurs.   topiramate 25 MG capsule Commonly known as:  TOPAMAX Take 1-4 capsules (25-100 mg total) by mouth 2 (two) times daily.  Objective:    BP 138/88   Pulse (!) 59   Temp 98.2 F (36.8 C) (Oral)   Ht 5' 7"  (1.702 m)   Wt 196 lb 3.2 oz (89 kg)   BMI 30.73 kg/m   Allergies  Allergen Reactions  . Galactose Anaphylaxis  . Other Anaphylaxis    Red meat, pork, lamb, deer meat  . Sulfasalazine Hives  . Erythromycin Hives  . Keflex [Cephalexin] Hives  . Nsaids     Has gastric ulcer  . Penicillins Hives  . Sulfa Antibiotics Hives  . Tolmetin     Has gastric ulcer    Wt Readings from Last 3 Encounters:  08/02/17 196 lb 3.2 oz (89 kg)  04/07/17 201 lb 3.2 oz (91.3 kg)  10/04/16 199 lb (90.3 kg)    Physical Exam  Constitutional: She is oriented to person, place, and time. She appears well-developed and well-nourished.  HENT:  Head: Normocephalic and atraumatic.  Eyes: Pupils are equal, round, and reactive to light. Conjunctivae and EOM  are normal.  Cardiovascular: Normal rate, regular rhythm, normal heart sounds and intact distal pulses.  Pulmonary/Chest: Effort normal and breath sounds normal.  Abdominal: Soft. Bowel sounds are normal.  Neurological: She is alert and oriented to person, place, and time. She has normal reflexes.  Skin: Skin is warm and dry. No rash noted.  Psychiatric: She has a normal mood and affect. Her behavior is normal. Judgment and thought content normal.    Results for orders placed or performed in visit on 10/04/16  CBC with Differential/Platelet  Result Value Ref Range   WBC 7.9 3.4 - 10.8 x10E3/uL   RBC 4.71 3.77 - 5.28 x10E6/uL   Hemoglobin 12.1 11.1 - 15.9 g/dL   Hematocrit 37.4 34.0 - 46.6 %   MCV 79 79 - 97 fL   MCH 25.7 (L) 26.6 - 33.0 pg   MCHC 32.4 31.5 - 35.7 g/dL   RDW 14.4 12.3 - 15.4 %   Platelets 366 150 - 379 x10E3/uL   Neutrophils 55 Not Estab. %   Lymphs 33 Not Estab. %   Monocytes 9 Not Estab. %   Eos 2 Not Estab. %   Basos 1 Not Estab. %   Neutrophils Absolute 4.4 1.4 - 7.0 x10E3/uL   Lymphocytes Absolute 2.6 0.7 - 3.1 x10E3/uL   Monocytes Absolute 0.7 0.1 - 0.9 x10E3/uL   EOS (ABSOLUTE) 0.2 0.0 - 0.4 x10E3/uL   Basophils Absolute 0.1 0.0 - 0.2 x10E3/uL   Immature Granulocytes 0 Not Estab. %   Immature Grans (Abs) 0.0 0.0 - 0.1 x10E3/uL  CMP14+EGFR  Result Value Ref Range   Glucose 93 65 - 99 mg/dL   BUN 9 6 - 24 mg/dL   Creatinine, Ser 0.71 0.57 - 1.00 mg/dL   GFR calc non Af Amer 105 >59 mL/min/1.73   GFR calc Af Amer 121 >59 mL/min/1.73   BUN/Creatinine Ratio 13 9 - 23   Sodium 137 134 - 144 mmol/L   Potassium 3.7 3.5 - 5.2 mmol/L   Chloride 103 96 - 106 mmol/L   CO2 20 20 - 29 mmol/L   Calcium 9.1 8.7 - 10.2 mg/dL   Total Protein 7.4 6.0 - 8.5 g/dL   Albumin 4.6 3.5 - 5.5 g/dL   Globulin, Total 2.8 1.5 - 4.5 g/dL   Albumin/Globulin Ratio 1.6 1.2 - 2.2   Bilirubin Total 0.2 0.0 - 1.2 mg/dL   Alkaline Phosphatase 77 39 - 117 IU/L   AST 14 0 -  40 IU/L    ALT 16 0 - 32 IU/L  TSH  Result Value Ref Range   TSH 0.830 0.450 - 4.500 uIU/mL  Folate  Result Value Ref Range   Folate 12.0 >3.0 ng/mL  Vitamin B12  Result Value Ref Range   Vitamin B-12 185 (L) 232 - 1,245 pg/mL      Assessment & Plan:   1. Other migraine without status migrainosus, not intractable - topiramate (TOPAMAX) 25 MG capsule; Take 1-4 capsules (25-100 mg total) by mouth 2 (two) times daily.  Dispense: 120 capsule; Refill: 5 - SUMAtriptan (IMITREX) 100 MG tablet; Take 1 tablet (100 mg total) by mouth every 2 (two) hours as needed for migraine. May repeat in 2 hours if headache persists or recurs.  Dispense: 10 tablet; Refill: 5 - promethazine (PHENERGAN) injection 25 mg - ketorolac (TORADOL) injection 60 mg   Continue all other maintenance medications as listed above.  Follow up plan: Recheck in 1 month  Educational handout given for [place] PA-C [place] [address]  [place], [place] [address] [phone]   08/03/2017, 11:13 AM

## 2017-08-09 ENCOUNTER — Telehealth: Payer: Self-pay | Admitting: Pediatrics

## 2017-08-09 ENCOUNTER — Other Ambulatory Visit: Payer: Self-pay | Admitting: Physician Assistant

## 2017-08-09 MED ORDER — TOPIRAMATE 25 MG PO CPSP: 25.0000 mg | ORAL_CAPSULE | Freq: Four times a day (QID) | ORAL | 2 refills | Status: DC

## 2017-08-09 MED ORDER — TOPIRAMATE 25 MG PO TABS: 25.0000 mg | ORAL_TABLET | Freq: Two times a day (BID) | ORAL | 5 refills | Status: DC

## 2017-08-31 ENCOUNTER — Ambulatory Visit: Payer: BC Managed Care – PPO | Admitting: Pediatrics

## 2017-08-31 ENCOUNTER — Encounter: Payer: Self-pay | Admitting: Pediatrics

## 2017-08-31 VITALS — BP 124/85 | HR 60 | Temp 98.2°F | Ht 67.0 in | Wt 199.2 lb

## 2017-08-31 DIAGNOSIS — F32A Depression, unspecified: Secondary | ICD-10-CM

## 2017-08-31 DIAGNOSIS — G43809 Other migraine, not intractable, without status migrainosus: Secondary | ICD-10-CM | POA: Diagnosis not present

## 2017-08-31 DIAGNOSIS — F329 Major depressive disorder, single episode, unspecified: Secondary | ICD-10-CM | POA: Diagnosis not present

## 2017-08-31 MED ORDER — ESCITALOPRAM OXALATE 20 MG PO TABS: 20.0000 mg | ORAL_TABLET | Freq: Every day | ORAL | 3 refills | Status: DC

## 2017-08-31 NOTE — Progress Notes (Signed)
  Subjective:   Patient ID: Courtney Joseph, female    DOB: 1975-08-15, 42 y.o.   MRN: [id number] CC: Migraine (1 month follow up)  HPI: Courtney Joseph is a 42 y.o. female   Migraines: Taking Topamax 1 tab (8m) in the morning and 1 tab at night.  In the morning when she first wakes up her fingers feel tingly, this goes away after a few hours.  She is taken 5 sumatriptan the last month.  Some of the headaches she think are more sinus related, she has been somewhat congested over the last couple weeks.  She thinks the Topamax is decreasing the rate of migraines.  She wants to continue it.  Mood has been ok. Takes lexapro regularly.  Relevant past medical, surgical, family and social history reviewed. Allergies and medications reviewed and updated. Social History   Tobacco Use  Smoking Status Former Smoker  . Last attempt to quit: 10/15/2011  . Years since quitting: 5.8  Smokeless Tobacco Never Used   ROS: Per HPI   Objective:    BP 124/85   Pulse 60   Temp 98.2 F (36.8 C) (Oral)   Ht 5' 7"  (1.702 m)   Wt 199 lb 3.2 oz (90.4 kg)   BMI 31.20 kg/m   Wt Readings from Last 3 Encounters:  08/31/17 199 lb 3.2 oz (90.4 kg)  08/02/17 196 lb 3.2 oz (89 kg)  04/07/17 201 lb 3.2 oz (91.3 kg)    Gen: NAD, alert, cooperative with exam, NCAT EYES: EOMI, no conjunctival injection, or no icterus ENT:  OP without erythema LYMPH: no cervical LAD CV: NRRR, normal S1/S2, no murmur, distal pulses 2+ b/l Resp: CTABL, no wheezes, normal WOB Ext: No edema, warm Neuro: Alert and oriented, strength equal b/l UE and LE, sensation intact b/l arms, LE, coordination grossly normal MSK: normal muscle bulk  Assessment & Plan:  KMadgelinewas seen today for migraine.  Diagnoses and all orders for this visit:  Other migraine without status migrainosus, not intractable Symptoms improved on topiramate, pt wants to continue. Will let me know fi side effects worsen.  Depression, unspecified depression  type Stable, cont below -     escitalopram (LEXAPRO) 20 MG tablet; Take 1 tablet (20 mg total) by mouth daily.   Follow up plan: Return in about 6 months (around 03/03/2018). CAssunta Found MD [place]

## 2017-09-09 ENCOUNTER — Ambulatory Visit: Payer: BC Managed Care – PPO | Admitting: Pediatrics

## 2017-09-09 ENCOUNTER — Ambulatory Visit (INDEPENDENT_AMBULATORY_CARE_PROVIDER_SITE_OTHER): Payer: BC Managed Care – PPO

## 2017-09-09 ENCOUNTER — Encounter: Payer: Self-pay | Admitting: Pediatrics

## 2017-09-09 VITALS — BP 131/80 | HR 54 | Temp 98.1°F | Ht 67.0 in | Wt 197.0 lb

## 2017-09-09 DIAGNOSIS — M25561 Pain in right knee: Secondary | ICD-10-CM

## 2017-09-09 NOTE — Progress Notes (Signed)
  Subjective:   Patient ID: Courtney Joseph, female    DOB: 1975-10-02, 42 y.o.   MRN: [id number] CC: Knee Pain (Right) and Ankle Pain (Left)  HPI: Courtney Joseph is a 42 y.o. female   She started having knee pain 4 to 6 weeks ago.  She does not remember any injury.  She is never hurt this knee before.  Walking tends to bother her at the most.  Some days are better than other days.  Usually the side of her left knee bothers her.  Is not able to take NSAIDs due to stomach ulcers.  She has been taking Tylenol which is helped some.  Sometimes the knee will throb at night.  No swelling that she is noticed.  No redness.  She broke her left ankle a few years ago, has also had a couple stress fractures in the ankle.  During changes in weather sometimes ankles start throbbing more than usual.  No swelling or redness in the ankle.  Relevant past medical, surgical, family and social history reviewed. Allergies and medications reviewed and updated. Social History   Tobacco Use  Smoking Status Former Smoker  . Last attempt to quit: 10/15/2011  . Years since quitting: 5.9  Smokeless Tobacco Never Used   ROS: Per HPI   Objective:    BP 131/80   Pulse (!) 54   Temp 98.1 F (36.7 C) (Oral)   Ht 5' 7"  (1.702 m)   Wt 197 lb (89.4 kg)   BMI 30.85 kg/m   Wt Readings from Last 3 Encounters:  09/09/17 197 lb (89.4 kg)  08/31/17 199 lb 3.2 oz (90.4 kg)  08/02/17 196 lb 3.2 oz (89 kg)    Gen: NAD, alert, cooperative with exam, NCAT EYES: EOMI, no conjunctival injection, or no icterus CV: NRRR, normal S1/S2, no murmur, distal pulses 2+ b/l Resp: CTABL, no wheezes, normal WOB Ext: No edema, warm Neuro: Alert and oriented MSK: no joint line tenderness, normal range of motion bilateral knees.  Crepitus present right knee.  Normal range of motion bilateral hips without pain.  Assessment & Plan:  Jadis was seen today for knee pain and ankle pain.  Diagnoses and all orders for this visit:  Acute pain  of right knee Continue Tylenol, start ice, topical Aspercreme, intolerant of oral NSAIDs due to stomach ulcer history.  Follow-up in 4 weeks if not improving. -     DG Knee 1-2 Views Right; Future   Follow up plan: 4 weeks sooner as needed. Courtney Found, MD [place]

## 2017-10-24 ENCOUNTER — Ambulatory Visit: Payer: BC Managed Care – PPO | Admitting: Nurse Practitioner

## 2017-10-24 ENCOUNTER — Ambulatory Visit (INDEPENDENT_AMBULATORY_CARE_PROVIDER_SITE_OTHER): Payer: BC Managed Care – PPO

## 2017-10-24 ENCOUNTER — Encounter: Payer: Self-pay | Admitting: Nurse Practitioner

## 2017-10-24 VITALS — BP 114/72 | HR 53 | Temp 97.4°F | Ht 67.0 in | Wt 198.0 lb

## 2017-10-24 DIAGNOSIS — M25562 Pain in left knee: Secondary | ICD-10-CM

## 2017-10-24 DIAGNOSIS — S8002XA Contusion of left knee, initial encounter: Secondary | ICD-10-CM | POA: Diagnosis not present

## 2017-10-24 DIAGNOSIS — M25561 Pain in right knee: Secondary | ICD-10-CM

## 2017-10-24 DIAGNOSIS — S8001XA Contusion of right knee, initial encounter: Secondary | ICD-10-CM

## 2017-10-24 NOTE — Progress Notes (Signed)
   Subjective:    Patient ID: Courtney Joseph, female    DOB: 16-Jan-1976, 42 y.o.   MRN: [id number]   Chief Complaint: bilateral knee pain [place] Saturday on both knees. Swollen and painful)   HPI Patient comes in today c/o of bil knee pain. She fell on her knees Saturday afternoon walking up her front steps. Both knees are swollen and painful to touch.   Review of Systems  Constitutional: Negative.   HENT: Negative.   Respiratory: Negative.   Cardiovascular: Negative.   Musculoskeletal: Positive for arthralgias (bil knees).  Neurological: Negative.   Psychiatric/Behavioral: Negative.   All other systems reviewed and are negative.      Objective:   Physical Exam  Constitutional: She appears well-developed and well-nourished. Distressed: mild.  Cardiovascular: Normal rate.  Pulmonary/Chest: Effort normal.  Musculoskeletal:  No knee effusion No patella tenderness contusion noted along bil tibial plateau No crepitus  Neurological: She is alert.  Skin: Skin is warm.  Psychiatric: She has a normal mood and affect. Her behavior is normal. Thought content normal.   BP 114/72   Pulse (!) 53   Temp (!) 97.4 F (36.3 C) (Oral)   Ht 5' 7"  (1.702 m)   Wt 198 lb (89.8 kg)   BMI 31.01 kg/m   bil knee xray- no fracture noted on either side    Assessment & Plan:  Courtney Joseph in today with chief complaint of bilateral knee pain [address] Saturday on both knees. Swollen and painful)   1. Acute pain of both knees - DG Knee Bilateral Standing AP; Future  2. Contusion of left knee, initial encounter   3. Contusion of right knee, initial encounter  ice bid Rest Tylenol oTC for pain  Mary-Margaret Hassell Done, FNP

## 2017-10-24 NOTE — Patient Instructions (Signed)
Contusion A contusion is a deep bruise. Contusions are the result of a blunt injury to tissues and muscle fibers under the skin. The injury causes bleeding under the skin. The skin overlying the contusion may turn blue, purple, or yellow. Minor injuries will give you a painless contusion, but more severe contusions may stay painful and swollen for a few weeks. What are the causes? This condition is usually caused by a blow, trauma, or direct force to an area of the body. What are the signs or symptoms? Symptoms of this condition include:  Swelling of the injured area.  Pain and tenderness in the injured area.  Discoloration. The area may have redness and then turn blue, purple, or yellow.  How is this diagnosed? This condition is diagnosed based on a physical exam and medical history. An X-ray, CT scan, or MRI may be needed to determine if there are any associated injuries, such as broken bones (fractures). How is this treated? Specific treatment for this condition depends on what area of the body was injured. In general, the best treatment for a contusion is resting, icing, applying pressure to (compression), and elevating the injured area. This is often called the RICE strategy. Over-the-counter anti-inflammatory medicines may also be recommended for pain control. Follow these instructions at home:  Rest the injured area.  If directed, apply ice to the injured area: ? Put ice in a plastic bag. ? Place a towel between your skin and the bag. ? Leave the ice on for 20 minutes, 2-3 times per day.  If directed, apply light compression to the injured area using an elastic bandage. Make sure the bandage is not wrapped too tightly. Remove and reapply the bandage as directed by your health care provider.  If possible, raise (elevate) the injured area above the level of your heart while you are sitting or lying down.  Take over-the-counter and prescription medicines only as told by your health  care provider. Contact a health care provider if:  Your symptoms do not improve after several days of treatment.  Your symptoms get worse.  You have difficulty moving the injured area. Get help right away if:  You have severe pain.  You have numbness in a hand or foot.  Your hand or foot turns pale or cold. This information is not intended to replace advice given to you by your health care provider. Make sure you discuss any questions you have with your health care provider. Document Released: 10/14/2004 Document Revised: 05/15/2015 Document Reviewed: 05/22/2014 Elsevier Interactive Patient Education  [employer].

## 2017-11-24 ENCOUNTER — Ambulatory Visit: Payer: BC Managed Care – PPO | Admitting: Family

## 2017-11-24 ENCOUNTER — Encounter: Payer: Self-pay | Admitting: Family

## 2017-11-24 VITALS — BP 127/81 | HR 52 | Temp 97.1°F | Ht 67.0 in | Wt 197.0 lb

## 2017-11-24 DIAGNOSIS — R040 Epistaxis: Secondary | ICD-10-CM | POA: Diagnosis not present

## 2017-11-24 DIAGNOSIS — J3089 Other allergic rhinitis: Secondary | ICD-10-CM | POA: Diagnosis not present

## 2017-11-24 DIAGNOSIS — J351 Hypertrophy of tonsils: Secondary | ICD-10-CM | POA: Diagnosis not present

## 2017-11-24 DIAGNOSIS — J069 Acute upper respiratory infection, unspecified: Secondary | ICD-10-CM | POA: Diagnosis not present

## 2017-11-24 LAB — CULTURE, GROUP A STREP

## 2017-11-24 LAB — RAPID STREP SCREEN (MED CTR MEBANE ONLY): STREP GP A AG, IA W/REFLEX: NEGATIVE

## 2017-11-24 MED ORDER — CETIRIZINE HCL 10 MG PO TABS: 10.0000 mg | ORAL_TABLET | Freq: Every day | ORAL | 11 refills | Status: DC

## 2017-11-24 MED ORDER — AYR SALINE NASAL NA GEL: 1.0000 "application " | Freq: Four times a day (QID) | NASAL | 1 refills | Status: DC | PRN

## 2017-11-24 NOTE — Patient Instructions (Signed)
Upper Respiratory Infection, Adult Most upper respiratory infections (URIs) are caused by a virus. A URI affects the nose, throat, and upper air passages. The most common type of URI is often called "the common cold." Follow these instructions at home:  Take medicines only as told by your doctor.  Gargle warm saltwater or take cough drops to comfort your throat as told by your doctor.  Use a warm mist humidifier or inhale steam from a shower to increase air moisture. This may make it easier to breathe.  Drink enough fluid to keep your pee (urine) clear or pale yellow.  Eat soups and other clear broths.  Have a healthy diet.  Rest as needed.  Go back to work when your fever is gone or your doctor says it is okay. ? You may need to stay home longer to avoid giving your URI to others. ? You can also wear a face mask and wash your hands often to prevent spread of the virus.  Use your inhaler more if you have asthma.  Do not use any tobacco products, including cigarettes, chewing tobacco, or electronic cigarettes. If you need help quitting, ask your doctor. Contact a doctor if:  You are getting worse, not better.  Your symptoms are not helped by medicine.  You have chills.  You are getting more short of breath.  You have brown or red mucus.  You have yellow or brown discharge from your nose.  You have pain in your face, especially when you bend forward.  You have a fever.  You have puffy (swollen) neck glands.  You have pain while swallowing.  You have white areas in the back of your throat. Get help right away if:  You have very bad or constant: ? Headache. ? Ear pain. ? Pain in your forehead, behind your eyes, and over your cheekbones (sinus pain). ? Chest pain.  You have long-lasting (chronic) lung disease and any of the following: ? Wheezing. ? Long-lasting cough. ? Coughing up blood. ? A change in your usual mucus.  You have a stiff neck.  You have  changes in your: ? Vision. ? Hearing. ? Thinking. ? Mood. This information is not intended to replace advice given to you by your health care provider. Make sure you discuss any questions you have with your health care provider. Document Released: 06/23/2007 Document Revised: 09/07/2015 Document Reviewed: 04/11/2013 Elsevier Interactive Patient Education  2018 [employer].

## 2017-11-24 NOTE — Progress Notes (Signed)
Subjective:    Patient ID: Courtney Joseph, female    DOB: 1975/12/31, 42 y.o.   MRN: [id number]  Chief Complaint  Patient presents with  . Sinusitis  . Epistaxis    Sinusitis  This is a new problem. The current episode started yesterday. The problem has been gradually worsening since onset. There has been no fever. Her pain is at a severity of 4/10. The pain is mild. Associated symptoms include congestion, coughing, ear pain (right), headaches, a hoarse voice, sinus pressure, sneezing and a sore throat. Pertinent negatives include no chills or shortness of breath. Past treatments include oral decongestants. The treatment provided mild relief.  Epistaxis   The bleeding has been from both nares. The current episode started today. The problem has been resolved. The bleeding is associated with dry air and recent URI. She has tried pressure for the symptoms. The treatment provided moderate relief. Her past medical history is significant for allergies.      Review of Systems  Constitutional: Negative for chills.  HENT: Positive for congestion, ear pain (right), hoarse voice, nosebleeds, sinus pressure, sneezing and sore throat.   Respiratory: Positive for cough. Negative for shortness of breath.   Neurological: Positive for headaches.  All other systems reviewed and are negative.      Objective:   Physical Exam  Constitutional: She is oriented to person, place, and time. She appears well-developed and well-nourished. No distress.  HENT:  Head: Normocephalic and atraumatic.  Right Ear: External ear normal.  Left Ear: External ear normal. Tympanic membrane is bulging.  Nose: Mucosal edema and rhinorrhea present.  Mouth/Throat: Oropharynx is clear and moist. Tonsils are 3+ on the right. Tonsils are 3+ on the left. No tonsillar exudate.  Eyes: Pupils are equal, round, and reactive to light.  Neck: Normal range of motion. Neck supple. No thyromegaly present.  Cardiovascular: Normal rate,  regular rhythm, normal heart sounds and intact distal pulses.  No murmur heard. Pulmonary/Chest: Effort normal and breath sounds normal. No respiratory distress. She has no wheezes.  Abdominal: Soft. Bowel sounds are normal. She exhibits no distension. There is no tenderness.  Musculoskeletal: Normal range of motion. She exhibits no edema or tenderness.  Neurological: She is alert and oriented to person, place, and time. She has normal reflexes. No cranial nerve deficit.  Skin: Skin is warm and dry.  Psychiatric: She has a normal mood and affect. Her behavior is normal. Judgment and thought content normal.  Vitals reviewed.     BP 127/81   Pulse (!) 52   Temp (!) 97.1 F (36.2 C) (Oral)   Ht 5' 7"  (1.702 m)   Wt 197 lb (89.4 kg)   BMI 30.85 kg/m      Assessment & Plan:  Zelda Reames comes in today with chief complaint of Sinusitis and Epistaxis   Diagnosis and orders addressed:  1. Enlarged tonsils - Rapid Strep Screen ([facility] ONLY) - cetirizine (ZYRTEC) 10 MG tablet; Take 1 tablet (10 mg total) by mouth daily.  Dispense: 30 tablet; Refill: 11 - saline (AYR) GEL; Place 1 application into the nose every 6 (six) hours as needed.  Dispense: 14.1 g; Refill: 1  2. Allergic rhinitis due to other allergic trigger, unspecified seasonality Continue Zyrtec Start Saline gel  Humidifer at night - cetirizine (ZYRTEC) 10 MG tablet; Take 1 tablet (10 mg total) by mouth daily.  Dispense: 30 tablet; Refill: 11 - saline (AYR) GEL; Place 1 application into the nose every 6 (six)  hours as needed.  Dispense: 14.1 g; Refill: 1  3. Viral upper respiratory tract infection - Take meds as prescribed - Use a cool mist humidifier  -Use saline nose sprays frequently -Force fluids -For any cough or congestion  Use plain Mucinex- regular strength or max strength is fine -For fever or aces or pains- take tylenol or ibuprofen. -Throat lozenges if help -RTO if symptoms worsen or not improve    - cetirizine (ZYRTEC) 10 MG tablet; Take 1 tablet (10 mg total) by mouth daily.  Dispense: 30 tablet; Refill: 11 - saline (AYR) GEL; Place 1 application into the nose every 6 (six) hours as needed.  Dispense: 14.1 g; Refill: [place], FNP

## 2017-11-29 ENCOUNTER — Telehealth: Payer: Self-pay | Admitting: Pediatrics

## 2017-11-29 MED ORDER — DOXYCYCLINE HYCLATE 100 MG PO TABS: 100.0000 mg | ORAL_TABLET | Freq: Two times a day (BID) | ORAL | 0 refills | Status: DC

## 2017-11-29 NOTE — Telephone Encounter (Signed)
Patient aware.

## 2017-11-29 NOTE — Telephone Encounter (Signed)
Doxycycline Prescription sent to pharmacy

## 2017-12-30 ENCOUNTER — Other Ambulatory Visit (INDEPENDENT_AMBULATORY_CARE_PROVIDER_SITE_OTHER): Payer: BC Managed Care – PPO

## 2017-12-30 ENCOUNTER — Ambulatory Visit: Payer: BC Managed Care – PPO | Admitting: Physician Assistant

## 2017-12-30 ENCOUNTER — Encounter: Payer: Self-pay | Admitting: Physician Assistant

## 2017-12-30 VITALS — BP 134/86 | HR 68 | Ht 67.0 in | Wt 200.4 lb

## 2017-12-30 DIAGNOSIS — R1084 Generalized abdominal pain: Secondary | ICD-10-CM

## 2017-12-30 DIAGNOSIS — R194 Change in bowel habit: Secondary | ICD-10-CM | POA: Diagnosis not present

## 2017-12-30 DIAGNOSIS — K219 Gastro-esophageal reflux disease without esophagitis: Secondary | ICD-10-CM

## 2017-12-30 LAB — COMPREHENSIVE METABOLIC PANEL
ALBUMIN: 4.4 g/dL (ref 3.5–5.2)
ALK PHOS: 61 U/L (ref 39–117)
ALT: 15 U/L (ref 0–35)
AST: 13 U/L (ref 0–37)
BILIRUBIN TOTAL: 0.3 mg/dL (ref 0.2–1.2)
BUN: 7 mg/dL (ref 6–23)
CALCIUM: 9.2 mg/dL (ref 8.4–10.5)
CO2: 26 mEq/L (ref 19–32)
Chloride: 104 mEq/L (ref 96–112)
Creatinine, Ser: 0.7 mg/dL (ref 0.40–1.20)
GFR: 96.21 mL/min (ref >60.00)
Glucose, Bld: 90 mg/dL (ref 70–99)
POTASSIUM: 3.6 meq/L (ref 3.5–5.1)
Sodium: 139 mEq/L (ref 135–145)
TOTAL PROTEIN: 7.4 g/dL (ref 6.0–8.3)

## 2017-12-30 LAB — CBC WITH DIFFERENTIAL/PLATELET
BASOS ABS: 0.1 10*3/uL (ref 0.0–0.1)
Basophils Relative: 0.8 % (ref 0.0–3.0)
EOS PCT: 2.2 % (ref 0.0–5.0)
Eosinophils Absolute: 0.2 10*3/uL (ref 0.0–0.7)
HCT: 38.5 % (ref 36.0–46.0)
HEMOGLOBIN: 12.7 g/dL (ref 12.0–15.0)
LYMPHS ABS: 2.5 10*3/uL (ref 0.7–4.0)
LYMPHS PCT: 32.4 % (ref 12.0–46.0)
MCHC: 33.1 g/dL (ref 30.0–36.0)
MCV: 80.2 fl (ref 78.0–100.0)
MONOS PCT: 8 % (ref 3.0–12.0)
Monocytes Absolute: 0.6 10*3/uL (ref 0.1–1.0)
NEUTROS PCT: 56.6 % (ref 43.0–77.0)
Neutro Abs: 4.3 10*3/uL (ref 1.4–7.7)
Platelets: 346 10*3/uL (ref 150.0–400.0)
RBC: 4.8 Mil/uL (ref 3.87–5.11)
RDW: 14.1 % (ref 11.5–15.5)
WBC: 7.6 10*3/uL (ref 4.0–10.5)

## 2017-12-30 LAB — LIPASE: LIPASE: 14 U/L (ref 11.0–59.0)

## 2017-12-30 MED ORDER — HYOSCYAMINE SULFATE 0.125 MG SL SUBL: 0.1250 mg | SUBLINGUAL_TABLET | Freq: Four times a day (QID) | SUBLINGUAL | 1 refills | Status: DC | PRN

## 2017-12-30 MED ORDER — OMEPRAZOLE 40 MG PO CPDR: 40.0000 mg | DELAYED_RELEASE_CAPSULE | Freq: Two times a day (BID) | ORAL | 3 refills | Status: DC

## 2017-12-30 NOTE — Progress Notes (Signed)
Assessment and plan reviewed

## 2017-12-30 NOTE — Patient Instructions (Addendum)
If you are age 44 or older, your body mass index should be between 23-30. Your Body mass index is 31.38 kg/m. If this is out of the aforementioned range listed, please consider follow up with your Primary Care Provider.  If you are age 73 or younger, your body mass index should be between 19-25. Your Body mass index is 31.38 kg/m. If this is out of the aformentioned range listed, please consider follow up with your Primary Care Provider.   Your provider has requested that you go to the basement level for lab work before leaving today. Press "B" on the elevator. The lab is located at the first door on the left as you exit the elevator.  We have sent the following medications to your pharmacy for you to pick up at your convenience: Omeprazole Hyoscyamine  Call in two weeks if not better.  Follow up with me on January 31, 2018 at 3:15 pm.  Thank you for choosing me and [facility].   Ellouise Newer, PA-C

## 2017-12-30 NOTE — Progress Notes (Signed)
Chief Complaint: Abdominal pain, change in bowel habits  HPI:   Courtney Joseph is a 42 y/o Caucasian female who was referred to me by Eustaquio Maize, MD for a complaint of abdominal pain and change in bowel habits.      08/10/2016 office visit with Dr. Henrene Pastor to discuss multiple GI complaints including abdominal pain, nausea and irregular bowel habits.  At that time she had previously been prescribed omeprazole 40 mg daily and Metamucil 2 tablespoons daily.  She reported that her bowel habits have improved.  Her epigastric discomfort had also improved.  Patient was counseled on exercise and weight loss and told to continue fiber and omeprazole.    Today, patient presents to clinic accompanied by her young daughter, she explains that over the past month and a half she has had increased issues with alternating bowel habits between loose stool and constipation.  She is wavering more towards the loose stool side which occurs 15 to 20 minutes after eating.  Also after having a bowel movement she will then have a generalized abdominal cramping which can last for few hours or up to a day and a half. Rated as a 6-7/10.  Also describes some increase in reflux.  She does tell me that she ran out of her Omeprazole 40 mg daily about 3 months ago and only just restarted this about 2 days ago over-the-counter.  She explains that certain foods including fried things tend to make it worse.  Describes 2 loose stools a day, her normal is 1 a day or maybe 1 every other day.    Denies fever, chills, weight loss, anorexia, nausea, blood in her stool, vomiting or symptoms that awaken her from sleep.  Past Medical History:  Diagnosis Date  . Anxiety   . Bone spur   . Chronic kidney disease    h/o kidney stones  . Depression   . Fatty liver   . Gastric erosions   . Headache    migraines    Past Surgical History:  Procedure Laterality Date  . BONE EXCISION Left 10/30/2015   Procedure: EXCISION OF LEFT CALCANEOUS  BONE SPUR AT ANTERIOR PROCESS;  Surgeon: Wylene Simmer, MD;  Location: [facility];  Service: Orthopedics;  Laterality: Left;  . uterine ablation    . WISDOM TOOTH EXTRACTION      Current Outpatient Medications  Medication Sig Dispense Refill  . ALPRAZolam (XANAX) 0.25 MG tablet Take 0.25 mg by mouth at bedtime as needed for anxiety.    . cetirizine (ZYRTEC) 10 MG tablet Take 1 tablet (10 mg total) by mouth daily. 30 tablet 11  . doxycycline (VIBRA-TABS) 100 MG tablet Take 1 tablet (100 mg total) by mouth 2 (two) times daily. 20 tablet 0  . escitalopram (LEXAPRO) 20 MG tablet Take 1 tablet (20 mg total) by mouth daily. 90 tablet 3  . omeprazole (PRILOSEC) 40 MG capsule Take 1 capsule (40 mg total) by mouth daily. 30 capsule 11  . saline (AYR) GEL Place 1 application into the nose every 6 (six) hours as needed. 14.1 g 1  . SUMAtriptan (IMITREX) 100 MG tablet Take 1 tablet (100 mg total) by mouth every 2 (two) hours as needed for migraine. May repeat in 2 hours if headache persists or recurs. 10 tablet 5  . topiramate (TOPAMAX) 25 MG tablet Take 1-4 tablets (25-100 mg total) by mouth 2 (two) times daily. 120 tablet 5   No current facility-administered medications for this visit.  Allergies as of 12/30/2017 - Review Complete 12/30/2017  Allergen Reaction Noted  . Galactose Anaphylaxis 04/07/2017  . Other Anaphylaxis 10/23/2015  . Sulfasalazine Hives 10/23/2015  . Erythromycin Hives 10/22/2015  . Keflex [cephalexin] Hives 04/18/2013  . Nsaids  04/01/2016  . Penicillins Hives 10/22/2015  . Sulfa antibiotics Hives 10/23/2015  . Tolmetin  04/01/2016    Family History  Problem Relation Age of Onset  . Hypertension Mother   . Depression Father   . Hypertension Father   . Depression Brother   . Learning disabilities Brother   . Cancer Maternal Aunt   . Diabetes Paternal Uncle   . Early death Maternal Grandfather   . Heart disease Maternal Grandfather   . Cancer  Paternal Grandmother   . Depression Paternal Grandmother   . Diabetes Paternal Grandmother   . Heart disease Paternal Grandfather   . Colon cancer Neg Hx   . Esophageal cancer Neg Hx     Social History   Socioeconomic History  . Marital status: Divorced    Spouse name: Not on file  . Number of children: Not on file  . Years of education: Not on file  . Highest education level: Not on file  Occupational History  . Not on file  Social Needs  . Financial resource strain: Not on file  . Food insecurity:    Worry: Not on file    Inability: Not on file  . Transportation needs:    Medical: Not on file    Non-medical: Not on file  Tobacco Use  . Smoking status: Former Smoker    Last attempt to quit: 10/15/2011    Years since quitting: 6.2  . Smokeless tobacco: Never Used  Substance and Sexual Activity  . Alcohol use: Yes    Comment: Very RARE  . Drug use: No  . Sexual activity: Never  Lifestyle  . Physical activity:    Days per week: Not on file    Minutes per session: Not on file  . Stress: Not on file  Relationships  . Social connections:    Talks on phone: Not on file    Gets together: Not on file    Attends religious service: Not on file    Active member of club or organization: Not on file    Attends meetings of clubs or organizations: Not on file    Relationship status: Not on file  . Intimate partner violence:    Fear of current or ex partner: Not on file    Emotionally abused: Not on file    Physically abused: Not on file    Forced sexual activity: Not on file  Other Topics Concern  . Not on file  Social History Narrative  . Not on file    Review of Systems:    Constitutional: No weight loss, fever or chills Cardiovascular: No chest pain Respiratory: No SOB Gastrointestinal: See HPI and otherwise negative   Physical Exam:  Vital signs: BP 134/86   Pulse 68   Ht 5' 7"  (1.702 m)   Wt 200 lb 6 oz (90.9 kg)   BMI 31.38 kg/m   Constitutional:    Pleasant overweight Caucasian female appears to be in NAD, Well developed, Well nourished, alert and cooperative Respiratory: Respirations even and unlabored. Lungs clear to auscultation bilaterally.   No wheezes, crackles, or rhonchi.  Cardiovascular: Normal S1, S2. No MRG. Regular rate and rhythm. No peripheral edema, cyanosis or pallor.  Gastrointestinal:  Soft, nondistended, moderate epigastric ttp.  No rebound or guarding. Normal bowel sounds. No appreciable masses or hepatomegaly. Rectal:  Not performed.  Psychiatric: Demonstrates good judgement and reason without abnormal affect or behaviors.  No recent labs or imaging.  Assessment: 1.  Abdominal pain: Generalized, though primarily epigastric on today's exam, patient describes as cramping; consider relation to gastritis +/- IBS versus other 2.  Change in bowel habits: Radiating between loose and constipation, more loose than anything else over the past 2 weeks, urgent after eating, history of similar symptoms last year resolved with fiber, also recent Doxycycline use for a couple of weeks which was started about 3 weeks ago, the symptoms have been going on for a month and a half total; Consider IBS vs other 3.  GERD: Currently uncontrolled thought patient has been off of her Omeprazole 40 mg for the past 3 months  Plan: 1.  Discussed with patient that all of her symptoms could be due to the fact that she has not been taking her Omeprazole over the past 3 months. 2.  We will increase patient's Omeprazole to 40 mg twice daily, 30-60 minutes before breakfast and dinner for the next month, with plans to decrease back to her original dose of 40 mg daily after that if symptoms have abated. 3.  Ordered labs including CBC, CMP and lipase. 4.  Prescribed Hyoscyamine sulfate 0.125 mg sublingual tabs every 6 hours as needed for abdominal cramping #30 with a refill 5.  Patient to follow in clinic in 3 to 4 weeks with me, sooner if indicated by labs  above.  Did discuss with patient that if she feels no better in the next 2 weeks she should call and let me know.  At that time would recommend further evaluation with right upper quadrant ultrasound.  Ellouise Newer, PA-C [facility] 12/30/2017, 2:48 PM  Cc: Eustaquio Maize, MD

## 2018-01-31 ENCOUNTER — Ambulatory Visit: Payer: BC Managed Care – PPO | Admitting: Physician Assistant

## 2018-01-31 ENCOUNTER — Encounter: Payer: Self-pay | Admitting: Physician Assistant

## 2018-01-31 VITALS — BP 126/80 | HR 68 | Ht 65.5 in | Wt 199.4 lb

## 2018-01-31 DIAGNOSIS — R194 Change in bowel habit: Secondary | ICD-10-CM | POA: Diagnosis not present

## 2018-01-31 DIAGNOSIS — R109 Unspecified abdominal pain: Secondary | ICD-10-CM

## 2018-01-31 NOTE — Progress Notes (Signed)
Reviewed

## 2018-01-31 NOTE — Progress Notes (Signed)
Chief Complaint: Follow-up abdominal pain and change in bowel habits  HPI:    Courtney Joseph is a 43 year old Caucasian female, who follows with Dr. Henrene Pastor and returns to clinic today for follow-up of her abdominal pain and change in bowel habits.    12/30/2017 office visit need to discuss increased issues with alternating bowel habits between loose stool and constipation as well as some abdominal cramping.  She had run out of her omeprazole 40 mg about 3 months prior.  At that time a moderate amount of epigastric pain on palpation.  Her omeprazole 40 mg was increased to twice daily with plans to decrease back to her original dose of 40 mg daily once symptoms had abated, ordered labs including CBC, CMP and lipase and prescribed Hyoscyamine sulfate every 6 hours as needed.  It was discussed that if she did not feel any better at follow-up then she may be considered for a right upper quadrant ultrasound.    12/30/2017 CBC, CMP and lipase returned normal.    Today, the patient explains that her symptoms are much much better.  Her reflux is under control, no further epigastric pain.  Her bowel movements are better.  She tells me they are normal 9/10 times.  She is using her Omeprazole 40 mg twice daily and is very happy with the results.  Does note that she had some breakthrough symptoms last week but was under a lot of stress and has noticed that everything gets worse when she is under stress.    Does continue to complain today of some lower abdominal discomfort, but has not tried her Hyoscyamine.  She tells me that it is not really a "big pain" more of a nuisance.  She does have the Hyoscyamine with her.    Denies fever, chills, weight loss, nausea, vomiting, heartburn or reflux.  Past Medical History:  Diagnosis Date  . Anxiety   . Bone spur   . Chronic kidney disease    h/o kidney stones  . Depression   . Fatty liver   . Gastric erosions   . Headache    migraines    Past Surgical History:    Procedure Laterality Date  . BONE EXCISION Left 10/30/2015   Procedure: EXCISION OF LEFT CALCANEOUS BONE SPUR AT ANTERIOR PROCESS;  Surgeon: Wylene Simmer, MD;  Location: [facility];  Service: Orthopedics;  Laterality: Left;  . uterine ablation    . WISDOM TOOTH EXTRACTION      Current Outpatient Medications  Medication Sig Dispense Refill  . ALPRAZolam (XANAX) 0.25 MG tablet Take 0.25 mg by mouth at bedtime as needed for anxiety.    . cetirizine (ZYRTEC) 10 MG tablet Take 1 tablet (10 mg total) by mouth daily. 30 tablet 11  . escitalopram (LEXAPRO) 20 MG tablet Take 1 tablet (20 mg total) by mouth daily. 90 tablet 3  . hyoscyamine (LEVSIN SL) 0.125 MG SL tablet Place 1 tablet (0.125 mg total) under the tongue every 6 (six) hours as needed. 30 tablet 1  . omeprazole (PRILOSEC) 40 MG capsule Take 1 capsule (40 mg total) by mouth 2 (two) times daily. Take 30-60 minutes before breakfast and dinner 60 capsule 3  . SUMAtriptan (IMITREX) 100 MG tablet Take 1 tablet (100 mg total) by mouth every 2 (two) hours as needed for migraine. May repeat in 2 hours if headache persists or recurs. 10 tablet 5  . topiramate (TOPAMAX) 25 MG tablet Take 1-4 tablets (25-100 mg total) by mouth  2 (two) times daily. 120 tablet 5   No current facility-administered medications for this visit.     Allergies as of 01/31/2018 - Review Complete 01/31/2018  Allergen Reaction Noted  . Galactose Anaphylaxis 04/07/2017  . Other Anaphylaxis 10/23/2015  . Sulfasalazine Hives 10/23/2015  . Erythromycin Hives 10/22/2015  . Keflex [cephalexin] Hives 04/18/2013  . Nsaids  04/01/2016  . Penicillins Hives 10/22/2015  . Sulfa antibiotics Hives 10/23/2015  . Tolmetin  04/01/2016    Family History  Problem Relation Age of Onset  . Hypertension Mother   . Depression Father   . Hypertension Father   . Depression Brother   . Learning disabilities Brother   . Cancer Maternal Aunt   . Diabetes Paternal Uncle    . Early death Maternal Grandfather   . Heart disease Maternal Grandfather   . Cancer Paternal Grandmother   . Depression Paternal Grandmother   . Diabetes Paternal Grandmother   . Heart disease Paternal Grandfather   . Colon cancer Neg Hx   . Esophageal cancer Neg Hx     Social History   Socioeconomic History  . Marital status: Divorced    Spouse name: Not on file  . Number of children: Not on file  . Years of education: Not on file  . Highest education level: Not on file  Occupational History  . Not on file  Social Needs  . Financial resource strain: Not on file  . Food insecurity:    Worry: Not on file    Inability: Not on file  . Transportation needs:    Medical: Not on file    Non-medical: Not on file  Tobacco Use  . Smoking status: Former Smoker    Last attempt to quit: 10/15/2011    Years since quitting: 6.3  . Smokeless tobacco: Never Used  Substance and Sexual Activity  . Alcohol use: Yes    Comment: Very RARE  . Drug use: No  . Sexual activity: Never  Lifestyle  . Physical activity:    Days per week: Not on file    Minutes per session: Not on file  . Stress: Not on file  Relationships  . Social connections:    Talks on phone: Not on file    Gets together: Not on file    Attends religious service: Not on file    Active member of club or organization: Not on file    Attends meetings of clubs or organizations: Not on file    Relationship status: Not on file  . Intimate partner violence:    Fear of current or ex partner: Not on file    Emotionally abused: Not on file    Physically abused: Not on file    Forced sexual activity: Not on file  Other Topics Concern  . Not on file  Social History Narrative  . Not on file    Review of Systems:    Constitutional: No weight loss, fever or chills Cardiovascular: No chest pain   Respiratory: No SOB Gastrointestinal: See HPI and otherwise negative   Physical Exam:  Vital signs: BP 126/80 (BP Location:  Left Arm, Patient Position: Sitting, Cuff Size: Normal)   Pulse 68   Ht 5' 5.5" (1.664 m) Comment: Height measured without shoes  Wt 199 lb 6 oz (90.4 kg)   BMI 32.67 kg/m   Constitutional:   Pleasant Overweight Caucasian female appears to be in NAD, Well developed, Well nourished, alert and cooperative Respiratory: Respirations even and unlabored. Lungs  clear to auscultation bilaterally.   No wheezes, crackles, or rhonchi.  Cardiovascular: Normal S1, S2. No MRG. Regular rate and rhythm. No peripheral edema, cyanosis or pallor.  Gastrointestinal:  Soft, nondistended, mild b/l lower abdominal ttp. No rebound or guarding. Normal bowel sounds. No appreciable masses or hepatomegaly. Rectal:  Not performed.  Psychiatric: Demonstrates good judgement and reason without abnormal affect or behaviors.  RELEVANT LABS AND IMAGING: CBC    Component Value Date/Time   WBC 7.6 12/30/2017 1516   RBC 4.80 12/30/2017 1516   HGB 12.7 12/30/2017 1516   HGB 12.1 10/04/2016 1713   HCT 38.5 12/30/2017 1516   HCT 37.4 10/04/2016 1713   PLT 346.0 12/30/2017 1516   PLT 366 10/04/2016 1713   MCV 80.2 12/30/2017 1516   MCV 79 10/04/2016 1713   MCH 25.7 (L) 10/04/2016 1713   MCHC 33.1 12/30/2017 1516   RDW 14.1 12/30/2017 1516   RDW 14.4 10/04/2016 1713   LYMPHSABS 2.5 12/30/2017 1516   LYMPHSABS 2.6 10/04/2016 1713   MONOABS 0.6 12/30/2017 1516   EOSABS 0.2 12/30/2017 1516   EOSABS 0.2 10/04/2016 1713   BASOSABS 0.1 12/30/2017 1516   BASOSABS 0.1 10/04/2016 1713    CMP     Component Value Date/Time   NA 139 12/30/2017 1516   NA 137 10/04/2016 1713   K 3.6 12/30/2017 1516   CL 104 12/30/2017 1516   CO2 26 12/30/2017 1516   GLUCOSE 90 12/30/2017 1516   BUN 7 12/30/2017 1516   BUN 9 10/04/2016 1713   CREATININE 0.70 12/30/2017 1516   CALCIUM 9.2 12/30/2017 1516   PROT 7.4 12/30/2017 1516   PROT 7.4 10/04/2016 1713   ALBUMIN 4.4 12/30/2017 1516   ALBUMIN 4.6 10/04/2016 1713   AST 13  12/30/2017 1516   ALT 15 12/30/2017 1516   ALKPHOS 61 12/30/2017 1516   BILITOT 0.3 12/30/2017 1516   BILITOT 0.2 10/04/2016 1713   GFRNONAA 105 10/04/2016 1713   GFRAA 121 10/04/2016 1713    Assessment: 1.  Abdominal pain: Epigastric pain resolved after Omeprazole was restarted, now some lower abdominal discomfort, likely related to IBS 2.  Change in bowel habits: Resolved  Plan: 1.   Would recommend the patient use her Hyoscyamine sulfate 0.125 mg sublingual tabs as needed for this lower abdominal discomfort. 2.  Continue Omeprazole 40 mg twice daily for another month, then decrease to Omeprazole 40 mg daily thereafter. 3.  Reviewed antireflux diet and lifestyle modifications. 4.  Discussed with the patient that there is a strong tie between IBS/ functional symptoms and  her emotions, so it is not abnormal that when she gets stressed she has an increase in problems. 5.  Patient to follow in clinic with me or Dr. Henrene Pastor in 3 to 4 months or sooner if necessary.  Ellouise Newer, PA-C [facility] 01/31/2018, 3:14 PM  Cc: Eustaquio Maize, MD

## 2018-01-31 NOTE — Patient Instructions (Addendum)
Normal BMI (Body Mass Index- based on height and weight) is between 19 and 25. Your BMI today is Body mass index is 32.67 kg/m. Courtney Joseph Please consider follow up  regarding your BMI with your Primary Care Provider.  Continue Omeprazole 40 mg twice daily for 1 month then decrease to daily.  Use Levsin as needed for abdominal discomfort  Thank you for entrusting me with your care and choosing [facility].  Ellouise Newer PA

## 2018-03-13 ENCOUNTER — Telehealth: Payer: Self-pay | Admitting: Pediatrics

## 2018-03-19 DIAGNOSIS — S82899A Other fracture of unspecified lower leg, initial encounter for closed fracture: Secondary | ICD-10-CM

## 2018-03-19 HISTORY — DX: Other fracture of unspecified lower leg, initial encounter for closed fracture: S82.899A

## 2018-03-24 NOTE — Telephone Encounter (Signed)
Multiple attempts made to contact patient.  This encounter will now be closed

## 2018-04-03 ENCOUNTER — Ambulatory Visit: Payer: BC Managed Care – PPO | Admitting: Family Medicine

## 2018-04-03 ENCOUNTER — Encounter: Payer: Self-pay | Admitting: Family Medicine

## 2018-04-03 ENCOUNTER — Other Ambulatory Visit: Payer: Self-pay

## 2018-04-03 VITALS — BP 121/79 | HR 64 | Temp 97.9°F | Ht 65.5 in | Wt 202.0 lb

## 2018-04-03 DIAGNOSIS — G43019 Migraine without aura, intractable, without status migrainosus: Secondary | ICD-10-CM | POA: Diagnosis not present

## 2018-04-03 MED ORDER — PROMETHAZINE HCL 25 MG PO TABS: 12.5000 mg | ORAL_TABLET | Freq: Three times a day (TID) | ORAL | 0 refills | Status: DC | PRN

## 2018-04-03 MED ORDER — METHYLPREDNISOLONE ACETATE 80 MG/ML IJ SUSP
80.0000 mg | Freq: Once | INTRAMUSCULAR | Status: AC
  Administered 2018-04-03: 80 mg via INTRAMUSCULAR

## 2018-04-03 NOTE — Patient Instructions (Signed)
You were treated for migraine headache today.  I have sent in promethazine as part of the migraine headache cocktail.  It will cause sedation so make sure you are ready to get in bed before taking.  Drink plenty of water.  We talked about consideration for a neurologist today.      Migraine Headache  A migraine headache is a very strong throbbing pain on one side or both sides of your head. Migraines can also cause other symptoms. Talk with your doctor about what things may bring on (trigger) your migraine headaches. Follow these instructions at home: Medicines  Take over-the-counter and prescription medicines only as told by your doctor.  Do not drive or use heavy machinery while taking prescription pain medicine.  To prevent or treat constipation while you are taking prescription pain medicine, your doctor may recommend that you: ? Drink enough fluid to keep your pee (urine) clear or pale yellow. ? Take over-the-counter or prescription medicines. ? Eat foods that are high in fiber. These include fresh fruits and vegetables, whole grains, and beans. ? Limit foods that are high in fat and processed sugars. These include fried and sweet foods. Lifestyle  Avoid alcohol.  Do not use any products that contain nicotine or tobacco, such as cigarettes and e-cigarettes. If you need help quitting, ask your doctor.  Get at least 8 hours of sleep every night.  Limit your stress. General instructions   Keep a journal to find out what may bring on your migraines. For example, write down: ? What you eat and drink. ? How much sleep you get. ? Any change in what you eat or drink. ? Any change in your medicines.  If you have a migraine: ? Avoid things that make your symptoms worse, such as bright lights. ? It may help to lie down in a dark, quiet room. ? Do not drive or use heavy machinery. ? Ask your doctor what activities are safe for you.  Keep all follow-up visits as told by your  doctor. This is important. Contact a doctor if:  You get a migraine that is different or worse than your usual migraines. Get help right away if:  Your migraine gets very bad.  You have a fever.  You have a stiff neck.  You have trouble seeing.  Your muscles feel weak or like you cannot control them.  You start to lose your balance a lot.  You start to have trouble walking.  You pass out (faint). This information is not intended to replace advice given to you by your health care provider. Make sure you discuss any questions you have with your health care provider. Document Released: 10/14/2007 Document Revised: 09/28/2017 Document Reviewed: 06/23/2015 Elsevier Interactive Patient Education  2019 [employer].

## 2018-04-03 NOTE — Progress Notes (Signed)
Subjective: CC: headache PCP: Baruch Gouty, FNP TDD:[id number] Courtney Joseph is a 43 y.o. female presenting to clinic today for:  1. Headache Patient reports onset of right sided posterior headache Thursday.  She notes that it is intermittent but can last up to several hours per episode.  Pain level is 8/10.  She notes that this is refractory to Excedrin Migraine, Tylenol and 3 doses of Imitrex.  She denies associated visual disturbance, unilateral weakness or aura.  No known trigger.  She feels that she is hydrating and sleeping appropriately.  She does endorse photophobia and phonophobia.  She reports daily use of Topamax 25 mg for prophylaxis.   ROS: Per HPI  Allergies  Allergen Reactions  . Galactose Anaphylaxis  . Other Anaphylaxis    Red meat, pork, lamb, deer meat  . Sulfasalazine Hives  . Erythromycin Hives  . Keflex [Cephalexin] Hives  . Nsaids     Has gastric ulcer  . Penicillins Hives  . Sulfa Antibiotics Hives  . Tolmetin     Has gastric ulcer   Past Medical History:  Diagnosis Date  . Anxiety   . Bone spur   . Chronic kidney disease    h/o kidney stones  . Depression   . Fatty liver   . Gastric erosions   . Headache    migraines    Current Outpatient Medications:  .  ALPRAZolam (XANAX) 0.25 MG tablet, Take 0.25 mg by mouth at bedtime as needed for anxiety., Disp: , Rfl:  .  cetirizine (ZYRTEC) 10 MG tablet, Take 1 tablet (10 mg total) by mouth daily., Disp: 30 tablet, Rfl: 11 .  escitalopram (LEXAPRO) 20 MG tablet, Take 1 tablet (20 mg total) by mouth daily., Disp: 90 tablet, Rfl: 3 .  hyoscyamine (LEVSIN SL) 0.125 MG SL tablet, Place 1 tablet (0.125 mg total) under the tongue every 6 (six) hours as needed., Disp: 30 tablet, Rfl: 1 .  omeprazole (PRILOSEC) 40 MG capsule, Take 1 capsule (40 mg total) by mouth 2 (two) times daily. Take 30-60 minutes before breakfast and dinner, Disp: 60 capsule, Rfl: 3 .  SUMAtriptan (IMITREX) 100 MG tablet, Take 1 tablet (100 mg  total) by mouth every 2 (two) hours as needed for migraine. May repeat in 2 hours if headache persists or recurs., Disp: 10 tablet, Rfl: 5 .  topiramate (TOPAMAX) 25 MG tablet, Take 1-4 tablets (25-100 mg total) by mouth 2 (two) times daily., Disp: 120 tablet, Rfl: 5 Social History   Socioeconomic History  . Marital status: Divorced    Spouse name: Not on file  . Number of children: Not on file  . Years of education: Not on file  . Highest education level: Not on file  Occupational History  . Not on file  Social Needs  . Financial resource strain: Not on file  . Food insecurity:    Worry: Not on file    Inability: Not on file  . Transportation needs:    Medical: Not on file    Non-medical: Not on file  Tobacco Use  . Smoking status: Former Smoker    Last attempt to quit: 10/15/2011    Years since quitting: 6.4  . Smokeless tobacco: Never Used  Substance and Sexual Activity  . Alcohol use: Yes    Comment: Very RARE  . Drug use: No  . Sexual activity: Never  Lifestyle  . Physical activity:    Days per week: Not on file    Minutes per session: Not  on file  . Stress: Not on file  Relationships  . Social connections:    Talks on phone: Not on file    Gets together: Not on file    Attends religious service: Not on file    Active member of club or organization: Not on file    Attends meetings of clubs or organizations: Not on file    Relationship status: Not on file  . Intimate partner violence:    Fear of current or ex partner: Not on file    Emotionally abused: Not on file    Physically abused: Not on file    Forced sexual activity: Not on file  Other Topics Concern  . Not on file  Social History Narrative  . Not on file   Family History  Problem Relation Age of Onset  . Hypertension Mother   . Depression Father   . Hypertension Father   . Depression Brother   . Learning disabilities Brother   . Cancer Maternal Aunt   . Diabetes Paternal Uncle   . Early death  Maternal Grandfather   . Heart disease Maternal Grandfather   . Cancer Paternal Grandmother   . Depression Paternal Grandmother   . Diabetes Paternal Grandmother   . Heart disease Paternal Grandfather   . Colon cancer Neg Hx   . Esophageal cancer Neg Hx     Objective: Office vital signs reviewed. BP 121/79   Pulse 64   Temp 97.9 F (36.6 C) (Oral)   Ht 5' 5.5" (1.664 m)   Wt 202 lb (91.6 kg)   BMI 33.10 kg/m   Physical Examination:  General: Awake, alert, well nourished, No acute distress HEENT: Normal    Eyes: PERRLA, extraocular membranes intact, sclera white    Nose: nasal turbinates moist, no nasal discharge    Throat: moist mucus membranes, no erythema, symmetric rise of palate noted MSK: normal gait and station Skin: dry; intact; no rashes or lesions Neuro: 5/5 UE and LE Strength and light touch sensation grossly intact, CN 2-12 grossly in tact. Normal UE cerebellar testing.   Assessment/ Plan: 43 y.o. female   1. Intractable migraine without aura and without status migrainosus No focal neurologic deficits.  I treated her with a dose of Depo-Medrol 80 mg intramuscularly here in office.  I have advised her to start promethazine in effort to abort migraine headache.  This was not given intramuscularly because she was driving herself today.  We discussed reasons for emergent evaluation emergency department.  I offered referral to neurology but she declined this today.  Follow-up with PCP PRN. - methylPREDNISolone acetate (DEPO-MEDROL) injection 80 mg - promethazine (PHENERGAN) 25 MG tablet; Take 0.5-1 tablets (12.5-25 mg total) by mouth every 8 (eight) hours as needed for nausea or vomiting.  Dispense: 15 tablet; Refill: 0   No orders of the defined types were placed in this encounter.  Meds ordered this encounter  Medications  . methylPREDNISolone acetate (DEPO-MEDROL) injection 80 mg  . promethazine (PHENERGAN) 25 MG tablet    Sig: Take 0.5-1 tablets (12.5-25 mg  total) by mouth every 8 (eight) hours as needed for nausea or vomiting.    Dispense:  15 tablet    Refill:  [place], DO [place] [phone]

## 2018-04-11 ENCOUNTER — Encounter: Payer: Self-pay | Admitting: Family Medicine

## 2018-04-11 ENCOUNTER — Ambulatory Visit: Payer: BC Managed Care – PPO | Admitting: Family Medicine

## 2018-04-11 ENCOUNTER — Other Ambulatory Visit: Payer: Self-pay

## 2018-04-11 VITALS — BP 132/81 | HR 72 | Temp 97.9°F | Ht 65.5 in | Wt 200.8 lb

## 2018-04-11 DIAGNOSIS — L0291 Cutaneous abscess, unspecified: Secondary | ICD-10-CM

## 2018-04-11 MED ORDER — DOXYCYCLINE HYCLATE 100 MG PO TABS: 100.0000 mg | ORAL_TABLET | Freq: Two times a day (BID) | ORAL | 0 refills | Status: DC

## 2018-04-11 NOTE — Progress Notes (Signed)
Subjective:  Patient ID: Courtney Joseph, female    DOB: 04/11/1975, 43 y.o.   MRN: [id number]  Chief Complaint:  Mass (Right shoulder, painful, red, 5 days)   HPI: Courtney Joseph is a 44 y.o. female presenting on 04/11/2018 for Mass (Right shoulder, painful, red, 5 days)   Pt presents today with complaints of a red, painful, hard knot to her right shoulder. Pt states this started 5 days ago. She has tried warm compresses without relief of redness or pain. Unknown if she had an injury or insect bite. Pain 8/10 with palpation. She states the area is hot to touch.    Relevant past medical, surgical, family, and social history reviewed and updated as indicated.  Allergies and medications reviewed and updated.   Past Medical History:  Diagnosis Date  . Anxiety   . Bone spur   . Chronic kidney disease    h/o kidney stones  . Depression   . Fatty liver   . Gastric erosions   . Headache    migraines    Past Surgical History:  Procedure Laterality Date  . BONE EXCISION Left 10/30/2015   Procedure: EXCISION OF LEFT CALCANEOUS BONE SPUR AT ANTERIOR PROCESS;  Surgeon: Wylene Simmer, MD;  Location: [facility];  Service: Orthopedics;  Laterality: Left;  . uterine ablation    . WISDOM TOOTH EXTRACTION      Social History   Socioeconomic History  . Marital status: Divorced    Spouse name: Not on file  . Number of children: Not on file  . Years of education: Not on file  . Highest education level: Not on file  Occupational History  . Not on file  Social Needs  . Financial resource strain: Not on file  . Food insecurity:    Worry: Not on file    Inability: Not on file  . Transportation needs:    Medical: Not on file    Non-medical: Not on file  Tobacco Use  . Smoking status: Former Smoker    Last attempt to quit: 10/15/2011    Years since quitting: 6.4  . Smokeless tobacco: Never Used  Substance and Sexual Activity  . Alcohol use: Yes    Comment: Very RARE   . Drug use: No  . Sexual activity: Never  Lifestyle  . Physical activity:    Days per week: Not on file    Minutes per session: Not on file  . Stress: Not on file  Relationships  . Social connections:    Talks on phone: Not on file    Gets together: Not on file    Attends religious service: Not on file    Active member of club or organization: Not on file    Attends meetings of clubs or organizations: Not on file    Relationship status: Not on file  . Intimate partner violence:    Fear of current or ex partner: Not on file    Emotionally abused: Not on file    Physically abused: Not on file    Forced sexual activity: Not on file  Other Topics Concern  . Not on file  Social History Narrative  . Not on file    Outpatient Encounter Medications as of 04/11/2018  Medication Sig  . ALPRAZolam (XANAX) 0.25 MG tablet Take 0.25 mg by mouth at bedtime as needed for anxiety.  . cetirizine (ZYRTEC) 10 MG tablet Take 1 tablet (10 mg total) by mouth daily.  Marland Kitchen escitalopram (  LEXAPRO) 20 MG tablet Take 1 tablet (20 mg total) by mouth daily.  . hyoscyamine (LEVSIN SL) 0.125 MG SL tablet Place 1 tablet (0.125 mg total) under the tongue every 6 (six) hours as needed.  Marland Kitchen omeprazole (PRILOSEC) 40 MG capsule Take 1 capsule (40 mg total) by mouth 2 (two) times daily. Take 30-60 minutes before breakfast and dinner  . promethazine (PHENERGAN) 25 MG tablet Take 0.5-1 tablets (12.5-25 mg total) by mouth every 8 (eight) hours as needed for nausea or vomiting.  . SUMAtriptan (IMITREX) 100 MG tablet Take 1 tablet (100 mg total) by mouth every 2 (two) hours as needed for migraine. May repeat in 2 hours if headache persists or recurs.  . topiramate (TOPAMAX) 25 MG tablet Take 1-4 tablets (25-100 mg total) by mouth 2 (two) times daily.  Marland Kitchen doxycycline (VIBRA-TABS) 100 MG tablet Take 1 tablet (100 mg total) by mouth 2 (two) times daily for 7 days. 1 po bid   No facility-administered encounter medications on file  as of 04/11/2018.     Allergies  Allergen Reactions  . Galactose Anaphylaxis  . Other Anaphylaxis    Red meat, pork, lamb, deer meat  . Sulfasalazine Hives  . Erythromycin Hives  . Keflex [Cephalexin] Hives  . Nsaids     Has gastric ulcer  . Penicillins Hives  . Sulfa Antibiotics Hives  . Tolmetin     Has gastric ulcer    Review of Systems  Constitutional: Negative for chills, fatigue and fever.  Respiratory: Negative for cough and shortness of breath.   Cardiovascular: Negative for chest pain and palpitations.  Musculoskeletal: Negative for arthralgias and myalgias.  Skin: Positive for color change. Negative for pallor and wound.       Lesion to right shoulder  Neurological: Negative for weakness and headaches.  Psychiatric/Behavioral: Negative for confusion.  All other systems reviewed and are negative.       Objective:  BP 132/81   Pulse 72   Temp 97.9 F (36.6 C) (Oral)   Ht 5' 5.5" (1.664 m)   Wt 200 lb 12.8 oz (91.1 kg)   BMI 32.91 kg/m    Wt Readings from Last 3 Encounters:  04/11/18 200 lb 12.8 oz (91.1 kg)  04/03/18 202 lb (91.6 kg)  01/31/18 199 lb 6 oz (90.4 kg)    Physical Exam Vitals signs and nursing note reviewed.  Constitutional:      General: She is not in acute distress.    Appearance: Normal appearance. She is not ill-appearing or toxic-appearing.  HENT:     Head: Normocephalic and atraumatic.     Mouth/Throat:     Mouth: Mucous membranes are moist.  Eyes:     Conjunctiva/sclera: Conjunctivae normal.     Pupils: Pupils are equal, round, and reactive to light.  Cardiovascular:     Rate and Rhythm: Normal rate and regular rhythm.     Heart sounds: Normal heart sounds. No murmur. No friction rub. No gallop.   Pulmonary:     Effort: Pulmonary effort is normal. No respiratory distress.     Breath sounds: Normal breath sounds.  Skin:    General: Skin is warm and dry.     Capillary Refill: Capillary refill takes less than 2 seconds.      Findings: Abscess present.       Neurological:     General: No focal deficit present.     Mental Status: She is alert and oriented to person, place, and time.  Psychiatric:        Mood and Affect: Mood normal.        Behavior: Behavior normal.        Thought Content: Thought content normal.        Judgment: Judgment normal.       Results for orders placed or performed in visit on 12/30/17  Lipase  Result Value Ref Range   Lipase 14.0 11.0 - 59.0 U/L  Comp Met (CMET)  Result Value Ref Range   Sodium 139 135 - 145 mEq/L   Potassium 3.6 3.5 - 5.1 mEq/L   Chloride 104 96 - 112 mEq/L   CO2 26 19 - 32 mEq/L   Glucose, Bld 90 70 - 99 mg/dL   BUN 7 6 - 23 mg/dL   Creatinine, Ser 0.70 0.40 - 1.20 mg/dL   Total Bilirubin 0.3 0.2 - 1.2 mg/dL   Alkaline Phosphatase 61 39 - 117 U/L   AST 13 0 - 37 U/L   ALT 15 0 - 35 U/L   Total Protein 7.4 6.0 - 8.3 g/dL   Albumin 4.4 3.5 - 5.2 g/dL   Calcium 9.2 8.4 - 10.5 mg/dL   GFR 96.21 >60.00 mL/min  CBC w/Diff  Result Value Ref Range   WBC 7.6 4.0 - 10.5 K/uL   RBC 4.80 3.87 - 5.11 Mil/uL   Hemoglobin 12.7 12.0 - 15.0 g/dL   HCT 38.5 36.0 - 46.0 %   MCV 80.2 78.0 - 100.0 fl   MCHC 33.1 30.0 - 36.0 g/dL   RDW 14.1 11.5 - 15.5 %   Platelets 346.0 150.0 - 400.0 K/uL   Neutrophils Relative % 56.6 43.0 - 77.0 %   Lymphocytes Relative 32.4 12.0 - 46.0 %   Monocytes Relative 8.0 3.0 - 12.0 %   Eosinophils Relative 2.2 0.0 - 5.0 %   Basophils Relative 0.8 0.0 - 3.0 %   Neutro Abs 4.3 1.4 - 7.7 K/uL   Lymphs Abs 2.5 0.7 - 4.0 K/uL   Monocytes Absolute 0.6 0.1 - 1.0 K/uL   Eosinophils Absolute 0.2 0.0 - 0.7 K/uL   Basophils Absolute 0.1 0.0 - 0.1 K/uL       Pertinent labs & imaging results that were available during my care of the patient were reviewed by me and considered in my medical decision making.  Assessment & Plan:  Camellia was seen today for mass.  Diagnoses and all orders for this visit:  Abscess Symptomatic care  discussed. Warm compresses. Medications as prescribed. Report any new or worsening symptoms. If symptoms do not improve after antibiotic therapy, will I&D, not fluctuant today.  -     doxycycline (VIBRA-TABS) 100 MG tablet; Take 1 tablet (100 mg total) by mouth 2 (two) times daily for 7 days. 1 po bid     Continue all other maintenance medications.  Follow up plan: Return if symptoms worsen or fail to improve.  Educational handout given for abscess  The above assessment and management plan was discussed with the patient. The patient verbalized understanding of and has agreed to the management plan. Patient is aware to call the clinic if symptoms persist or worsen. Patient is aware when to return to the clinic for a follow-up visit. Patient educated on when it is appropriate to go to the emergency department.   Monia Pouch, FNP-C [facility] [phone]

## 2018-04-11 NOTE — Patient Instructions (Signed)

## 2018-04-14 ENCOUNTER — Telehealth: Payer: Self-pay | Admitting: *Deleted

## 2018-04-14 DIAGNOSIS — L0291 Cutaneous abscess, unspecified: Secondary | ICD-10-CM

## 2018-04-14 MED ORDER — DOXYCYCLINE HYCLATE 100 MG PO TABS: 100.0000 mg | ORAL_TABLET | Freq: Two times a day (BID) | ORAL | 0 refills | Status: AC

## 2018-04-14 NOTE — Telephone Encounter (Signed)
[facility] called stating pt called them stating she is 7 pills short on her Doxycyline. Pharmacy says there count was accurate. Can we send in dispense #7 of the Doxy? Please advise.

## 2018-04-14 NOTE — Telephone Encounter (Signed)
Per Dr. Livia Snellen, sent in prescription for Doxycycline, 7 pills.

## 2018-04-14 NOTE — Telephone Encounter (Signed)
Okay to add on 7 pills

## 2018-05-24 ENCOUNTER — Encounter: Payer: Self-pay | Admitting: Internal Medicine

## 2018-05-31 ENCOUNTER — Encounter: Payer: Self-pay | Admitting: General Surgery

## 2018-06-01 ENCOUNTER — Other Ambulatory Visit: Payer: Self-pay

## 2018-06-01 ENCOUNTER — Ambulatory Visit (INDEPENDENT_AMBULATORY_CARE_PROVIDER_SITE_OTHER): Payer: BC Managed Care – PPO | Admitting: Internal Medicine

## 2018-06-01 ENCOUNTER — Encounter: Payer: Self-pay | Admitting: Internal Medicine

## 2018-06-01 VITALS — Ht 67.0 in | Wt 196.0 lb

## 2018-06-01 DIAGNOSIS — K219 Gastro-esophageal reflux disease without esophagitis: Secondary | ICD-10-CM

## 2018-06-01 DIAGNOSIS — R109 Unspecified abdominal pain: Secondary | ICD-10-CM | POA: Diagnosis not present

## 2018-06-01 DIAGNOSIS — K589 Irritable bowel syndrome without diarrhea: Secondary | ICD-10-CM | POA: Diagnosis not present

## 2018-06-01 MED ORDER — OMEPRAZOLE 40 MG PO CPDR: 40.0000 mg | DELAYED_RELEASE_CAPSULE | Freq: Two times a day (BID) | ORAL | 3 refills | Status: DC

## 2018-06-01 NOTE — Progress Notes (Signed)
HISTORY OF PRESENT ILLNESS:  Courtney Joseph is a pleasant 43 y.o. female, who works at [employer] and whose family members are patients of mine, presents today for an Systems developer telehealth visit regarding abdominal cramping, irregular bowel movements, had chronic GERD.  Previous EGD elsewhere.  Last seen in this office January 31, 2018.  See that dictation for details.  First, the patient tells me that she is on omeprazole 40 mg twice daily to manage her GERD symptoms.  She still has been having difficulties with weight loss.  Lower dose omeprazole is not as effective.  She denies dysphasia.  Next, she describes alternating bowel habits with associated abdominal cramping.  Previously prescribed Levsin but has not been using regularly.  She denies bleeding, fevers, or weight loss.  Symptoms are hence they have been previously.  As previous, she associates increased situational stress and anxiety with worsening abdominal complaints.  Most recent laboratories including hemoglobin and x-rays are unremarkable  REVIEW OF SYSTEMS:  All non-GI ROS negative unless otherwise stated in the HPI except for anxiety  Past Medical History:  Diagnosis Date  . Anxiety   . Bone spur   . Broken ankle 03/2018  . Chronic kidney disease    h/o kidney stones  . Depression   . Fatty liver   . Gastric erosions   . Headache    migraines    Past Surgical History:  Procedure Laterality Date  . BONE EXCISION Left 10/30/2015   Procedure: EXCISION OF LEFT CALCANEOUS BONE SPUR AT ANTERIOR PROCESS;  Surgeon: Wylene Simmer, MD;  Location: [place] [facility];  Service: Orthopedics;  Laterality: Left;  . uterine ablation    . [place] EXTRACTION      Social History Courtney Joseph  reports that she quit smoking about 6 years ago. She has never used smokeless tobacco. She reports current alcohol use. She reports that she does not use drugs.  family history includes Cancer in her  maternal aunt and paternal grandmother; Depression in her brother, father, and paternal grandmother; Diabetes in her paternal grandmother and paternal uncle; Early death in her maternal grandfather; Heart disease in her maternal grandfather and paternal grandfather; Hypertension in her father and mother; Learning disabilities in her brother.  Allergies  Allergen Reactions  . Galactose Anaphylaxis  . Other Anaphylaxis    Red meat, pork, lamb, deer meat  . Sulfasalazine Hives  . Erythromycin Hives  . Keflex [Cephalexin] Hives  . Nsaids     Has gastric ulcer  . Penicillins Hives  . Sulfa Antibiotics Hives  . Tolmetin     Has gastric ulcer       PHYSICAL EXAMINATION: The patient looks well.  She is alert and oriented.  Cooperative No additional physical examination information with telehealth visit  ASSESSMENT:  1.  Chronic GERD.  Requiring omeprazole 40 mg twice daily to control symptoms. 2.  Obesity.  Ongoing 3.  Irritable bowel syndrome.  Alternating.  Chronic but worsening 4.  Situational anxiety due to coronavirus pandemic   PLAN:  1.  Reflux precautions 2.  Weight loss 3.  PRESCRIBE omeprazole 40 mg twice daily; #60; 11 refills.  The risks of the medication reviewed 4.  Advised to take daily fiber supplementation in the form of Metamucil.  2 tablespoons.  This to help with bowel irregularities 5.  Advised to use Levsin sublingual 1-2 every 4 hours as needed for pain 6.  Recommend routine GI office follow-up 1 year.  Sooner if  needed 7.  Due for routine screening colonoscopy around age 64 This WebEx audiovisual telehealth visit was scheduled by the patient who consented to this visit.  She was in her home and I was in my office during the encounter.  She understands her may be an associated professional charge for this service.

## 2018-06-01 NOTE — Patient Instructions (Addendum)
If you are age 45 or older, your body mass index should be between 23-30. Your Body mass index is 30.7 kg/m. If this is out of the aforementioned range listed, please consider follow up with your Primary Care Provider.  If you are age 73 or younger, your body mass index should be between 19-25. Your Body mass index is 30.7 kg/m. If this is out of the aformentioned range listed, please consider follow up with your Primary Care Provider.   We have sent the following medications to your pharmacy for you to pick up at your convenience: Omeprazole 40 mg twice daily  Reflux precautions (see attached)\  Weight loss   Take daily fiber supplementation in the form of Metamucil.  2 tablespoons. This to help with bowel irregularities  Advised to use Levsin sublingual 1-2 every 4 hours as needed for pain  Recommend routine GI office follow-up 1 year.  Sooner if needed  Due for routine screening colonoscopy around age 60.  Thank you for choosing me and [facility].   Scarlette Shorts, MD

## 2018-06-01 NOTE — Addendum Note (Signed)
Addended by: Candie Mile on: 06/01/2018 04:50 PM   Modules accepted: Orders

## 2018-07-10 ENCOUNTER — Other Ambulatory Visit: Payer: Self-pay

## 2018-07-11 ENCOUNTER — Encounter: Payer: BC Managed Care – PPO | Admitting: Family Medicine

## 2018-07-26 ENCOUNTER — Other Ambulatory Visit: Payer: Self-pay

## 2018-07-27 ENCOUNTER — Encounter: Payer: Self-pay | Admitting: Family Medicine

## 2018-07-27 ENCOUNTER — Ambulatory Visit (INDEPENDENT_AMBULATORY_CARE_PROVIDER_SITE_OTHER): Payer: BC Managed Care – PPO | Admitting: Family Medicine

## 2018-07-27 VITALS — BP 133/82 | HR 64 | Temp 97.4°F | Wt 199.0 lb

## 2018-07-27 DIAGNOSIS — Z0001 Encounter for general adult medical examination with abnormal findings: Secondary | ICD-10-CM

## 2018-07-27 DIAGNOSIS — Z0000 Encounter for general adult medical examination without abnormal findings: Secondary | ICD-10-CM

## 2018-07-27 DIAGNOSIS — Z13220 Encounter for screening for lipoid disorders: Secondary | ICD-10-CM | POA: Diagnosis not present

## 2018-07-27 DIAGNOSIS — Z6831 Body mass index (BMI) 31.0-31.9, adult: Secondary | ICD-10-CM | POA: Diagnosis not present

## 2018-07-27 DIAGNOSIS — Z23 Encounter for immunization: Secondary | ICD-10-CM

## 2018-07-27 DIAGNOSIS — Z1329 Encounter for screening for other suspected endocrine disorder: Secondary | ICD-10-CM

## 2018-07-27 DIAGNOSIS — G43709 Chronic migraine without aura, not intractable, without status migrainosus: Secondary | ICD-10-CM

## 2018-07-27 DIAGNOSIS — R5383 Other fatigue: Secondary | ICD-10-CM

## 2018-07-27 DIAGNOSIS — F339 Major depressive disorder, recurrent, unspecified: Secondary | ICD-10-CM | POA: Insufficient documentation

## 2018-07-27 MED ORDER — SUMATRIPTAN SUCCINATE 100 MG PO TABS: 100.0000 mg | ORAL_TABLET | ORAL | 5 refills | Status: DC | PRN

## 2018-07-27 MED ORDER — ESCITALOPRAM OXALATE 20 MG PO TABS: 20.0000 mg | ORAL_TABLET | Freq: Every day | ORAL | 3 refills | Status: DC

## 2018-07-27 MED ORDER — AIMOVIG 70 MG/ML ~~LOC~~ SOAJ: 70.0000 mg | SUBCUTANEOUS | 6 refills | Status: DC

## 2018-07-27 NOTE — Addendum Note (Signed)
Addended byCarrolyn Leigh on: 07/27/2018 03:21 PM   Modules accepted: Orders

## 2018-07-27 NOTE — Patient Instructions (Signed)
Health Maintenance, Female Adopting a healthy lifestyle and getting preventive care are important in promoting health and wellness. Ask your health care provider about:  The right schedule for you to have regular tests and exams.  Things you can do on your own to prevent diseases and keep yourself healthy. What should I know about diet, weight, and exercise? Eat a healthy diet   Eat a diet that includes plenty of vegetables, fruits, low-fat dairy products, and lean protein.  Do not eat a lot of foods that are high in solid fats, added sugars, or sodium. Maintain a healthy weight Body mass index (BMI) is used to identify weight problems. It estimates body fat based on height and weight. Your health care provider can help determine your BMI and help you achieve or maintain a healthy weight. Get regular exercise Get regular exercise. This is one of the most important things you can do for your health. Most adults should:  Exercise for at least 150 minutes each week. The exercise should increase your heart rate and make you sweat (moderate-intensity exercise).  Do strengthening exercises at least twice a week. This is in addition to the moderate-intensity exercise.  Spend less time sitting. Even light physical activity can be beneficial. Watch cholesterol and blood lipids Have your blood tested for lipids and cholesterol at 45 years of age, then have this test every 5 years. Have your cholesterol levels checked more often if:  Your lipid or cholesterol levels are high.  You are older than 45 years of age.  You are at high risk for heart disease. What should I know about cancer screening? Depending on your health history and family history, you may need to have cancer screening at various ages. This may include screening for:  Breast cancer.  Cervical cancer.  Colorectal cancer.  Skin cancer.  Lung cancer. What should I know about heart disease, diabetes, and high blood  pressure? Blood pressure and heart disease  High blood pressure causes heart disease and increases the risk of stroke. This is more likely to develop in people who have high blood pressure readings, are of African descent, or are overweight.  Have your blood pressure checked: ? Every 3-5 years if you are 18-39 years of age. ? Every year if you are 40 years old or older. Diabetes Have regular diabetes screenings. This checks your fasting blood sugar level. Have the screening done:  Once every three years after age 40 if you are at a normal weight and have a low risk for diabetes.  More often and at a younger age if you are overweight or have a high risk for diabetes. What should I know about preventing infection? Hepatitis B If you have a higher risk for hepatitis B, you should be screened for this virus. Talk with your health care provider to find out if you are at risk for hepatitis B infection. Hepatitis C Testing is recommended for:  Everyone born from 1945 through 1965.  Anyone with known risk factors for hepatitis C. Sexually transmitted infections (STIs)  Get screened for STIs, including gonorrhea and chlamydia, if: ? You are sexually active and are younger than 45 years of age. ? You are older than 45 years of age and your health care provider tells you that you are at risk for this type of infection. ? Your sexual activity has changed since you were last screened, and you are at increased risk for chlamydia or gonorrhea. Ask your health care provider if   you are at risk.  Ask your health care provider about whether you are at high risk for HIV. Your health care provider may recommend a prescription medicine to help prevent HIV infection. If you choose to take medicine to prevent HIV, you should first get tested for HIV. You should then be tested every 3 months for as long as you are taking the medicine. Pregnancy  If you are about to stop having your period (premenopausal) and  you may become pregnant, seek counseling before you get pregnant.  Take 400 to 800 micrograms (mcg) of folic acid every day if you become pregnant.  Ask for birth control (contraception) if you want to prevent pregnancy. Osteoporosis and menopause Osteoporosis is a disease in which the bones lose minerals and strength with aging. This can result in bone fractures. If you are 65 years old or older, or if you are at risk for osteoporosis and fractures, ask your health care provider if you should:  Be screened for bone loss.  Take a calcium or vitamin D supplement to lower your risk of fractures.  Be given hormone replacement therapy (HRT) to treat symptoms of menopause. Follow these instructions at home: Lifestyle  Do not use any products that contain nicotine or tobacco, such as cigarettes, e-cigarettes, and chewing tobacco. If you need help quitting, ask your health care provider.  Do not use street drugs.  Do not share needles.  Ask your health care provider for help if you need support or information about quitting drugs. Alcohol use  Do not drink alcohol if: ? Your health care provider tells you not to drink. ? You are pregnant, may be pregnant, or are planning to become pregnant.  If you drink alcohol: ? Limit how much you use to 0-1 drink a day. ? Limit intake if you are breastfeeding.  Be aware of how much alcohol is in your drink. In the [place] one drink equals one 12 oz bottle of beer (355 mL), one 5 oz glass of wine (148 mL), or one 1 oz glass of hard liquor (44 mL). General instructions  Schedule regular health, dental, and eye exams.  Stay current with your vaccines.  Tell your health care provider if: ? You often feel depressed. ? You have ever been abused or do not feel safe at home. Summary  Adopting a healthy lifestyle and getting preventive care are important in promoting health and wellness.  Follow your health care provider's instructions about healthy  diet, exercising, and getting tested or screened for diseases.  Follow your health care provider's instructions on monitoring your cholesterol and blood pressure. This information is not intended to replace advice given to you by your health care provider. Make sure you discuss any questions you have with your health care provider. Document Released: 07/20/2010 Document Revised: 12/28/2017 Document Reviewed: 12/28/2017 Elsevier Patient Education  2020 [employer]  

## 2018-07-27 NOTE — Progress Notes (Signed)
Subjective:  Patient ID: Courtney Joseph, female    DOB: Jun 15, 1975, 43 y.o.   MRN: [id number]  Chief Complaint:  Annual Exam   HPI: Chapel Silverthorn is a 43 y.o. female presenting on 07/27/2018 for Annual Exam  Pt presents today for her annual physical exam. Pt sees OB/GYN for her PAP and mammograms. She is scheduled to see them in the upcoming month. Pt stas she has been doing ok. States she continues to have the hand and feet tingling from the Topamax. Pt states she would like to switch this to another migraine preventative. Pt states she still gets headaches. States she has at least one per week and can control them with Excedrin or her Imitrex. She does report increased fatigue . She feels this may be due to stress from COVID-19. She works at the [employer] and the pandemic has caused a lot of changes. States this has increased her work stress greatly. She states her depression is well controlled with her current medications. No other complaints or concerns.   Relevant past medical, surgical, family, and social history reviewed and updated as indicated.  Allergies and medications reviewed and updated.   Past Medical History:  Diagnosis Date  . Anxiety   . Bone spur   . Broken ankle 03/2018  . Chronic kidney disease    h/o kidney stones  . Depression   . Fatty liver   . Gastric erosions   . Headache    migraines    Past Surgical History:  Procedure Laterality Date  . BONE EXCISION Left 10/30/2015   Procedure: EXCISION OF LEFT CALCANEOUS BONE SPUR AT ANTERIOR PROCESS;  Surgeon: Wylene Simmer, MD;  Location: [facility];  Service: Orthopedics;  Laterality: Left;  . uterine ablation    . WISDOM TOOTH EXTRACTION      Social History   Socioeconomic History  . Marital status: Divorced    Spouse name: Not on file  . Number of children: Not on file  . Years of education: Not on file  . Highest education level: Not on file  Occupational History  .  Occupation: staff account  Social Needs  . Financial resource strain: Not on file  . Food insecurity    Worry: Not on file    Inability: Not on file  . Transportation needs    Medical: Not on file    Non-medical: Not on file  Tobacco Use  . Smoking status: Former Smoker    Quit date: 10/15/2011    Years since quitting: 6.7  . Smokeless tobacco: Never Used  Substance and Sexual Activity  . Alcohol use: Yes    Comment: Very RARE  . Drug use: No  . Sexual activity: Never  Lifestyle  . Physical activity    Days per week: Not on file    Minutes per session: Not on file  . Stress: Not on file  Relationships  . Social Herbalist on phone: Not on file    Gets together: Not on file    Attends religious service: Not on file    Active member of club or organization: Not on file    Attends meetings of clubs or organizations: Not on file    Relationship status: Not on file  . Intimate partner violence    Fear of current or ex partner: Not on file    Emotionally abused: Not on file    Physically abused: Not on file  Forced sexual activity: Not on file  Other Topics Concern  . Not on file  Social History Narrative  . Not on file    Outpatient Encounter Medications as of 07/27/2018  Medication Sig  . cetirizine (ZYRTEC) 10 MG tablet Take 1 tablet (10 mg total) by mouth daily.  Marland Kitchen escitalopram (LEXAPRO) 20 MG tablet Take 1 tablet (20 mg total) by mouth daily.  . hyoscyamine (LEVSIN SL) 0.125 MG SL tablet Place 1 tablet (0.125 mg total) under the tongue every 6 (six) hours as needed.  Marland Kitchen omeprazole (PRILOSEC) 40 MG capsule Take 1 capsule (40 mg total) by mouth 2 (two) times daily. Take 30-60 minutes before breakfast and dinner  . promethazine (PHENERGAN) 25 MG tablet Take 0.5-1 tablets (12.5-25 mg total) by mouth every 8 (eight) hours as needed for nausea or vomiting.  . SUMAtriptan (IMITREX) 100 MG tablet Take 1 tablet (100 mg total) by mouth every 2 (two) hours as needed for  migraine. May repeat in 2 hours if headache persists or recurs.  . [DISCONTINUED] escitalopram (LEXAPRO) 20 MG tablet Take 1 tablet (20 mg total) by mouth daily.  . [DISCONTINUED] SUMAtriptan (IMITREX) 100 MG tablet Take 1 tablet (100 mg total) by mouth every 2 (two) hours as needed for migraine. May repeat in 2 hours if headache persists or recurs.  . [DISCONTINUED] topiramate (TOPAMAX) 25 MG tablet Take 1-4 tablets (25-100 mg total) by mouth 2 (two) times daily.  Marland Kitchen ALPRAZolam (XANAX) 0.25 MG tablet Take 0.25 mg by mouth at bedtime as needed for anxiety.  Eduard Roux (AIMOVIG) 70 MG/ML SOAJ Inject 70 mg into the skin every 30 (thirty) days.   No facility-administered encounter medications on file as of 07/27/2018.     Allergies  Allergen Reactions  . Galactose Anaphylaxis  . Other Anaphylaxis    Red meat, pork, lamb, deer meat  . Sulfasalazine Hives  . Erythromycin Hives  . Keflex [Cephalexin] Hives  . Nsaids     Has gastric ulcer  . Penicillins Hives  . Sulfa Antibiotics Hives  . Tolmetin     Has gastric ulcer    Review of Systems  Constitutional: Positive for fatigue. Negative for activity change, appetite change, chills, diaphoresis, fever and unexpected weight change.  Eyes: Negative for photophobia and visual disturbance.  Respiratory: Negative for cough, chest tightness and shortness of breath.   Cardiovascular: Negative for chest pain, palpitations and leg swelling.  Gastrointestinal: Negative for abdominal distention, abdominal pain, anal bleeding, blood in stool, constipation, diarrhea, nausea, rectal pain and vomiting.  Endocrine: Negative for cold intolerance, heat intolerance, polydipsia, polyphagia and polyuria.  Genitourinary: Negative for decreased urine volume, difficulty urinating, vaginal bleeding, vaginal discharge and vaginal pain.  Musculoskeletal: Negative for arthralgias and myalgias.  Skin: Negative for color change, pallor and rash.  Neurological:  Positive for headaches. Negative for dizziness, tremors, seizures, syncope, facial asymmetry, speech difficulty, weakness, light-headedness and numbness (in hands and feet).  Psychiatric/Behavioral: Positive for dysphoric mood. Negative for agitation, behavioral problems, confusion, decreased concentration, hallucinations, self-injury, sleep disturbance and suicidal ideas. The patient is not nervous/anxious and is not hyperactive.   All other systems reviewed and are negative.       Objective:  BP 133/82   Pulse 64   Temp (!) 97.4 F (36.3 C) (Oral)   Wt 199 lb (90.3 kg)   BMI 31.17 kg/m    Wt Readings from Last 3 Encounters:  07/27/18 199 lb (90.3 kg)  06/01/18 196 lb (88.9 kg)  05/31/18  196 lb 6 oz (89.1 kg)    Physical Exam Vitals signs and nursing note reviewed.  Constitutional:      General: She is not in acute distress.    Appearance: Normal appearance. She is well-developed and well-groomed. She is not ill-appearing, toxic-appearing or diaphoretic.  HENT:     Head: Normocephalic and atraumatic.     Jaw: There is normal jaw occlusion.     Right Ear: Hearing normal.     Left Ear: Hearing normal.     Nose: Nose normal.     Mouth/Throat:     Lips: Pink.     Mouth: Mucous membranes are moist.     Pharynx: Oropharynx is clear. Uvula midline.  Eyes:     General: Lids are normal.     Extraocular Movements: Extraocular movements intact.     Conjunctiva/sclera: Conjunctivae normal.     Pupils: Pupils are equal, round, and reactive to light.  Neck:     Musculoskeletal: Normal range of motion and neck supple.     Thyroid: No thyroid mass, thyromegaly or thyroid tenderness.     Vascular: No carotid bruit or JVD.     Trachea: Trachea and phonation normal.  Cardiovascular:     Rate and Rhythm: Normal rate and regular rhythm.     Chest Wall: PMI is not displaced.     Pulses: Normal pulses.     Heart sounds: Normal heart sounds. No murmur. No friction rub. No gallop.    Pulmonary:     Effort: Pulmonary effort is normal. No respiratory distress.     Breath sounds: Normal breath sounds. No wheezing.  Abdominal:     General: Bowel sounds are normal. There is no distension or abdominal bruit.     Palpations: Abdomen is soft. There is no hepatomegaly or splenomegaly.     Tenderness: There is no abdominal tenderness. There is no right CVA tenderness or left CVA tenderness.     Hernia: No hernia is present.  Musculoskeletal: Normal range of motion.     Right lower leg: No edema.     Left lower leg: No edema.  Lymphadenopathy:     Cervical: No cervical adenopathy.  Skin:    General: Skin is warm and dry.     Capillary Refill: Capillary refill takes less than 2 seconds.     Coloration: Skin is not cyanotic, jaundiced or pale.     Findings: No rash.  Neurological:     General: No focal deficit present.     Mental Status: She is alert and oriented to person, place, and time.     Cranial Nerves: Cranial nerves are intact.     Sensory: Sensation is intact.     Motor: Motor function is intact.     Coordination: Coordination is intact.     Gait: Gait is intact.     Deep Tendon Reflexes: Reflexes are normal and symmetric.  Psychiatric:        Attention and Perception: Attention and perception normal.        Mood and Affect: Mood and affect normal.        Speech: Speech normal.        Behavior: Behavior normal. Behavior is cooperative.        Thought Content: Thought content normal.        Cognition and Memory: Cognition and memory normal.        Judgment: Judgment normal.     Results for orders placed or performed in  visit on 12/30/17  Lipase  Result Value Ref Range   Lipase 14.0 11.0 - 59.0 U/L  Comp Met (CMET)  Result Value Ref Range   Sodium 139 135 - 145 mEq/L   Potassium 3.6 3.5 - 5.1 mEq/L   Chloride 104 96 - 112 mEq/L   CO2 26 19 - 32 mEq/L   Glucose, Bld 90 70 - 99 mg/dL   BUN 7 6 - 23 mg/dL   Creatinine, Ser 0.70 0.40 - 1.20 mg/dL    Total Bilirubin 0.3 0.2 - 1.2 mg/dL   Alkaline Phosphatase 61 39 - 117 U/L   AST 13 0 - 37 U/L   ALT 15 0 - 35 U/L   Total Protein 7.4 6.0 - 8.3 g/dL   Albumin 4.4 3.5 - 5.2 g/dL   Calcium 9.2 8.4 - 10.5 mg/dL   GFR 96.21 >60.00 mL/min  CBC w/Diff  Result Value Ref Range   WBC 7.6 4.0 - 10.5 K/uL   RBC 4.80 3.87 - 5.11 Mil/uL   Hemoglobin 12.7 12.0 - 15.0 g/dL   HCT 38.5 36.0 - 46.0 %   MCV 80.2 78.0 - 100.0 fl   MCHC 33.1 30.0 - 36.0 g/dL   RDW 14.1 11.5 - 15.5 %   Platelets 346.0 150.0 - 400.0 K/uL   Neutrophils Relative % 56.6 43.0 - 77.0 %   Lymphocytes Relative 32.4 12.0 - 46.0 %   Monocytes Relative 8.0 3.0 - 12.0 %   Eosinophils Relative 2.2 0.0 - 5.0 %   Basophils Relative 0.8 0.0 - 3.0 %   Neutro Abs 4.3 1.4 - 7.7 K/uL   Lymphs Abs 2.5 0.7 - 4.0 K/uL   Monocytes Absolute 0.6 0.1 - 1.0 K/uL   Eosinophils Absolute 0.2 0.0 - 0.7 K/uL   Basophils Absolute 0.1 0.0 - 0.1 K/uL       Pertinent labs & imaging results that were available during my care of the patient were reviewed by me and considered in my medical decision making.  Assessment & Plan:  Nohemy was seen today for annual exam.  Diagnoses and all orders for this visit:  Annual physical exam Health maintenance discussed. Diet and exercise encouraged. Will have follow up with GYN for PAP and mammogram. Labs pending.  -     CMP14+EGFR -     CBC with Differential/Platelet -     Lipid panel -     Thyroid Panel With TSH -     Vitamin B12 -     VITAMIN D 25 Hydroxy (Vit-D Deficiency, Fractures) -     HIV Antibody (routine testing w rflx)  Chronic migraine without aura without status migrainosus, not intractable Adverse drug reaction from Topamax. Would like to switch to another preventative. Will trial Aimovig once monthly. Continue abortive therapy as prescribed. Report any new or worsening symptoms. Follow up in 3 months.  -     Erenumab-aooe (AIMOVIG) 70 MG/ML SOAJ; Inject 70 mg into the skin every 30  (thirty) days. -     SUMAtriptan (IMITREX) 100 MG tablet; Take 1 tablet (100 mg total) by mouth every 2 (two) hours as needed for migraine. May repeat in 2 hours if headache persists or recurs.  Need for vaccine for Td (tetanus-diphtheria) Vaccine given today.   BMI 31.0-31.9,adult Diet and exercise encouraged. Labs pending.  -     CMP14+EGFR -     Lipid panel -     Thyroid Panel With TSH  Screening for lipid disorders -  Lipid panel  Screening for thyroid disorder -     Thyroid Panel With TSH  Other fatigue Sleep hygiene discussed. Will check below. Report any new or worsening symptoms.  -     Vitamin B12 -     VITAMIN D 25 Hydroxy (Vit-D Deficiency, Fractures)  Depression, recurrent (HCC) Stable. Continue below.  -     Thyroid Panel With TSH -     escitalopram (LEXAPRO) 20 MG tablet; Take 1 tablet (20 mg total) by mouth daily.     Continue all other maintenance medications.  Follow up plan: Return in about 3 months (around 10/27/2018), or if symptoms worsen or fail to improve, for migraine.  Educational handout given for health maintenance  The above assessment and management plan was discussed with the patient. The patient verbalized understanding of and has agreed to the management plan. Patient is aware to call the clinic if symptoms persist or worsen. Patient is aware when to return to the clinic for a follow-up visit. Patient educated on when it is appropriate to go to the emergency department.   Monia Pouch, FNP-C [facility] [phone]

## 2018-08-01 ENCOUNTER — Ambulatory Visit (INDEPENDENT_AMBULATORY_CARE_PROVIDER_SITE_OTHER): Payer: BC Managed Care – PPO | Admitting: Family Medicine

## 2018-08-01 ENCOUNTER — Encounter: Payer: Self-pay | Admitting: Family Medicine

## 2018-08-01 ENCOUNTER — Other Ambulatory Visit: Payer: Self-pay

## 2018-08-01 DIAGNOSIS — H6591 Unspecified nonsuppurative otitis media, right ear: Secondary | ICD-10-CM | POA: Diagnosis not present

## 2018-08-01 DIAGNOSIS — S00412A Abrasion of left ear, initial encounter: Secondary | ICD-10-CM

## 2018-08-01 LAB — CMP14+EGFR
ALT: 16 IU/L (ref 0–32)
AST: 18 IU/L (ref 0–40)
Albumin/Globulin Ratio: 1.7 (ref 1.2–2.2)
Albumin: 4.5 g/dL (ref 3.8–4.8)
Alkaline Phosphatase: 77 IU/L (ref 39–117)
BUN/Creatinine Ratio: 12 (ref 9–23)
BUN: 8 mg/dL (ref 6–24)
Bilirubin Total: 0.2 mg/dL (ref 0.0–1.2)
CO2: 16 mmol/L — ABNORMAL LOW (ref 20–29)
Calcium: 9.5 mg/dL (ref 8.7–10.2)
Chloride: 107 mmol/L — ABNORMAL HIGH (ref 96–106)
Creatinine, Ser: 0.69 mg/dL (ref 0.57–1.00)
GFR calc Af Amer: 122 mL/min/{1.73_m2} (ref >59)
GFR calc non Af Amer: 105 mL/min/{1.73_m2} (ref >59)
Globulin, Total: 2.7 g/dL (ref 1.5–4.5)
Glucose: 97 mg/dL (ref 65–99)
Potassium: 4.1 mmol/L (ref 3.5–5.2)
Sodium: 141 mmol/L (ref 134–144)
Total Protein: 7.2 g/dL (ref 6.0–8.5)

## 2018-08-01 LAB — CBC WITH DIFFERENTIAL/PLATELET
Basophils Absolute: 0.1 10*3/uL (ref 0.0–0.2)
Basos: 1 %
EOS (ABSOLUTE): 0.2 10*3/uL (ref 0.0–0.4)
Eos: 2 %
Hematocrit: 41.5 % (ref 34.0–46.6)
Hemoglobin: 13.5 g/dL (ref 11.1–15.9)
Immature Grans (Abs): 0 10*3/uL (ref 0.0–0.1)
Immature Granulocytes: 0 %
Lymphocytes Absolute: 2.3 10*3/uL (ref 0.7–3.1)
Lymphs: 27 %
MCH: 27.2 pg (ref 26.6–33.0)
MCHC: 32.5 g/dL (ref 31.5–35.7)
MCV: 84 fL (ref 79–97)
Monocytes Absolute: 0.7 10*3/uL (ref 0.1–0.9)
Monocytes: 8 %
Neutrophils Absolute: 5.3 10*3/uL (ref 1.4–7.0)
Neutrophils: 62 %
Platelets: 375 10*3/uL (ref 150–450)
RBC: 4.97 x10E6/uL (ref 3.77–5.28)
RDW: 13.7 % (ref 11.7–15.4)
WBC: 8.6 10*3/uL (ref 3.4–10.8)

## 2018-08-01 LAB — THYROID PANEL WITH TSH
Free Thyroxine Index: 1.8 (ref 1.2–4.9)
T3 Uptake Ratio: 25 % (ref 24–39)
T4, Total: 7.1 ug/dL (ref 4.5–12.0)
TSH: 1.13 u[IU]/mL (ref 0.450–4.500)

## 2018-08-01 LAB — LIPID PANEL
Chol/HDL Ratio: 3.4 ratio (ref 0.0–4.4)
Cholesterol, Total: 134 mg/dL (ref 100–199)
HDL: 39 mg/dL — ABNORMAL LOW (ref >39)
LDL Calculated: 74 mg/dL (ref 0–99)
Triglycerides: 107 mg/dL (ref 0–149)
VLDL Cholesterol Cal: 21 mg/dL (ref 5–40)

## 2018-08-01 LAB — VITAMIN B12: Vitamin B-12: 236 pg/mL (ref 232–1245)

## 2018-08-01 LAB — HIV ANTIBODY (ROUTINE TESTING W REFLEX): HIV Screen 4th Generation wRfx: NONREACTIVE

## 2018-08-01 LAB — VITAMIN D 25 HYDROXY (VIT D DEFICIENCY, FRACTURES): Vit D, 25-Hydroxy: 25.3 ng/mL — ABNORMAL LOW (ref 30.0–100.0)

## 2018-08-01 MED ORDER — FLUTICASONE PROPIONATE 50 MCG/ACT NA SUSP: 2.0000 | Freq: Every day | NASAL | 6 refills | Status: DC

## 2018-08-01 NOTE — Progress Notes (Signed)
Virtual Visit via telephone Note Due to COVID-19, visit is conducted virtually and was requested by patient. This visit type was conducted due to national recommendations for restrictions regarding the COVID-19 Pandemic (e.g. social distancing) in an effort to limit this patient's exposure and mitigate transmission in our community. All issues noted in this document were discussed and addressed.  A physical exam was not performed with this format.   I connected with Courtney Joseph on 08/01/18 at [facility] by telephone and verified that I am speaking with the correct person using two identifiers. Courtney Joseph is currently located at home and family is currently with them during visit. The provider, Monia Pouch, FNP is located in their office at time of visit.  I discussed the limitations, risks, security and privacy concerns of performing an evaluation and management service by telephone and the availability of in person appointments. I also discussed with the patient that there may be a patient responsible charge related to this service. The patient expressed understanding and agreed to proceed.  Subjective:  Patient ID: Courtney Joseph, female    DOB: 19-Mar-1975, 43 y.o.   MRN: [id number]  Chief Complaint:  Ear Pain   HPI: Courtney Joseph is a 43 y.o. female presenting on 08/01/2018 for Ear Pain   Pt complains of fullness in her right ear. States this started a few days ago. She denies pain, fever, chills, or headache. No lymphadenopathy. She also reports blood in her left ear canal. States she cleaned it with a q-tip and noticed blood on the q-tip. States she has only noticed the blood on the q-tips. No drainage from the ear or pain. No hearing loss in either ear.     Relevant past medical, surgical, family, and social history reviewed and updated as indicated.  Allergies and medications reviewed and updated.   Past Medical History:  Diagnosis Date  . Anxiety   . Bone spur   . Broken ankle  03/2018  . Chronic kidney disease    h/o kidney stones  . Depression   . Fatty liver   . Gastric erosions   . Headache    migraines    Past Surgical History:  Procedure Laterality Date  . BONE EXCISION Left 10/30/2015   Procedure: EXCISION OF LEFT CALCANEOUS BONE SPUR AT ANTERIOR PROCESS;  Surgeon: Wylene Simmer, MD;  Location: [facility];  Service: Orthopedics;  Laterality: Left;  . uterine ablation    . WISDOM TOOTH EXTRACTION      Social History   Socioeconomic History  . Marital status: Divorced    Spouse name: Not on file  . Number of children: Not on file  . Years of education: Not on file  . Highest education level: Not on file  Occupational History  . Occupation: staff account  Social Needs  . Financial resource strain: Not on file  . Food insecurity    Worry: Not on file    Inability: Not on file  . Transportation needs    Medical: Not on file    Non-medical: Not on file  Tobacco Use  . Smoking status: Former Smoker    Quit date: 10/15/2011    Years since quitting: 6.8  . Smokeless tobacco: Never Used  Substance and Sexual Activity  . Alcohol use: Yes    Comment: Very RARE  . Drug use: No  . Sexual activity: Never  Lifestyle  . Physical activity    Days per week: Not on file    Minutes  per session: Not on file  . Stress: Not on file  Relationships  . Social Herbalist on phone: Not on file    Gets together: Not on file    Attends religious service: Not on file    Active member of club or organization: Not on file    Attends meetings of clubs or organizations: Not on file    Relationship status: Not on file  . Intimate partner violence    Fear of current or ex partner: Not on file    Emotionally abused: Not on file    Physically abused: Not on file    Forced sexual activity: Not on file  Other Topics Concern  . Not on file  Social History Narrative  . Not on file    Outpatient Encounter Medications as of 08/01/2018   Medication Sig  . ALPRAZolam (XANAX) 0.25 MG tablet Take 0.25 mg by mouth at bedtime as needed for anxiety.  . cetirizine (ZYRTEC) 10 MG tablet Take 1 tablet (10 mg total) by mouth daily.  Eduard Roux (AIMOVIG) 70 MG/ML SOAJ Inject 70 mg into the skin every 30 (thirty) days.  Marland Kitchen escitalopram (LEXAPRO) 20 MG tablet Take 1 tablet (20 mg total) by mouth daily.  . fluticasone (FLONASE) 50 MCG/ACT nasal spray Place 2 sprays into both nostrils daily.  . hyoscyamine (LEVSIN SL) 0.125 MG SL tablet Place 1 tablet (0.125 mg total) under the tongue every 6 (six) hours as needed.  Marland Kitchen omeprazole (PRILOSEC) 40 MG capsule Take 1 capsule (40 mg total) by mouth 2 (two) times daily. Take 30-60 minutes before breakfast and dinner  . promethazine (PHENERGAN) 25 MG tablet Take 0.5-1 tablets (12.5-25 mg total) by mouth every 8 (eight) hours as needed for nausea or vomiting.  . SUMAtriptan (IMITREX) 100 MG tablet Take 1 tablet (100 mg total) by mouth every 2 (two) hours as needed for migraine. May repeat in 2 hours if headache persists or recurs.   No facility-administered encounter medications on file as of 08/01/2018.     Allergies  Allergen Reactions  . Galactose Anaphylaxis  . Other Anaphylaxis    Red meat, pork, lamb, deer meat  . Sulfasalazine Hives  . Erythromycin Hives  . Keflex [Cephalexin] Hives  . Nsaids     Has gastric ulcer  . Penicillins Hives  . Sulfa Antibiotics Hives  . Tolmetin     Has gastric ulcer    Review of Systems  Constitutional: Negative for chills, fatigue, fever and unexpected weight change.  HENT: Negative for congestion, ear pain, facial swelling, postnasal drip, rhinorrhea, sinus pressure, sinus pain and sore throat.        Ear fullness - right, blood in left ear  Respiratory: Negative for cough and shortness of breath.   Cardiovascular: Negative for chest pain and palpitations.  Neurological: Negative for dizziness, light-headedness and headaches.  Hematological:  Negative for adenopathy.  Psychiatric/Behavioral: Negative for confusion.  All other systems reviewed and are negative.        Observations/Objective: No vital signs or physical exam, this was a telephone or virtual health encounter.  Pt alert and oriented, answers all questions appropriately, and able to speak in full sentences.    Assessment and Plan: Courtney Joseph was seen today for ear pain.  Diagnoses and all orders for this visit:  Right otitis media with effusion Reported symptoms are likely due to otitis media with effusion. Not concerning for acute bacterial otitis. Will trial flonase. Continue zyrtec. Report any  new or worsening symptoms.  -     fluticasone (FLONASE) 50 MCG/ACT nasal spray; Place 2 sprays into both nostrils daily.  Abrasion of left ear, initial encounter Symptoms reported are likely caused by an abrasion from cleaning the ear. No red flags. No drainage, fever, chills, or pain. Symptomatic care discussed. Report any new or worsening symptoms.     Follow Up Instructions: Return if symptoms worsen or fail to improve.    I discussed the assessment and treatment plan with the patient. The patient was provided an opportunity to ask questions and all were answered. The patient agreed with the plan and demonstrated an understanding of the instructions.   The patient was advised to call back or seek an in-person evaluation if the symptoms worsen or if the condition fails to improve as anticipated.  The above assessment and management plan was discussed with the patient. The patient verbalized understanding of and has agreed to the management plan. Patient is aware to call the clinic if symptoms persist or worsen. Patient is aware when to return to the clinic for a follow-up visit. Patient educated on when it is appropriate to go to the emergency department.    I provided 15 minutes of non-face-to-face time during this encounter. The call started at 0920. The call  ended at 0935. The other time was used for coordination of care.    Monia Pouch, FNP-C [facility] [address] [place], [place] [address] [phone]

## 2018-09-19 ENCOUNTER — Emergency Department (HOSPITAL_COMMUNITY): Payer: BC Managed Care – PPO

## 2018-09-19 ENCOUNTER — Other Ambulatory Visit: Payer: Self-pay

## 2018-09-19 ENCOUNTER — Encounter (HOSPITAL_COMMUNITY): Payer: Self-pay | Admitting: Emergency Medicine

## 2018-09-19 ENCOUNTER — Emergency Department (HOSPITAL_COMMUNITY)
Admission: EM | Admit: 2018-09-19 | Discharge: 2018-09-19 | Disposition: A | Discharge status: Home / Self Care | Payer: BC Managed Care – PPO | Attending: Emergency Medicine | Admitting: Emergency Medicine

## 2018-09-19 DIAGNOSIS — S99911A Unspecified injury of right ankle, initial encounter: Secondary | ICD-10-CM | POA: Diagnosis present

## 2018-09-19 DIAGNOSIS — N189 Chronic kidney disease, unspecified: Secondary | ICD-10-CM | POA: Insufficient documentation

## 2018-09-19 DIAGNOSIS — Z87891 Personal history of nicotine dependence: Secondary | ICD-10-CM | POA: Diagnosis not present

## 2018-09-19 DIAGNOSIS — Y93K1 Activity, walking an animal: Secondary | ICD-10-CM | POA: Insufficient documentation

## 2018-09-19 DIAGNOSIS — Y999 Unspecified external cause status: Secondary | ICD-10-CM | POA: Diagnosis not present

## 2018-09-19 DIAGNOSIS — Y929 Unspecified place or not applicable: Secondary | ICD-10-CM | POA: Diagnosis not present

## 2018-09-19 DIAGNOSIS — W19XXXA Unspecified fall, initial encounter: Secondary | ICD-10-CM

## 2018-09-19 DIAGNOSIS — X501XXA Overexertion from prolonged static or awkward postures, initial encounter: Secondary | ICD-10-CM | POA: Insufficient documentation

## 2018-09-19 DIAGNOSIS — S8264XA Nondisplaced fracture of lateral malleolus of right fibula, initial encounter for closed fracture: Secondary | ICD-10-CM

## 2018-09-19 HISTORY — PX: OTHER SURGICAL HISTORY: SHX169

## 2018-09-19 LAB — CBC WITH DIFFERENTIAL/PLATELET
Abs Immature Granulocytes: 0.02 10*3/uL (ref 0.00–0.07)
Basophils Absolute: 0.1 10*3/uL (ref 0.0–0.1)
Basophils Relative: 1 %
Eosinophils Absolute: 0.2 10*3/uL (ref 0.0–0.5)
Eosinophils Relative: 2 %
HCT: 41.1 % (ref 36.0–46.0)
Hemoglobin: 12.7 g/dL (ref 12.0–15.0)
Immature Granulocytes: 0 %
Lymphocytes Relative: 21 %
Lymphs Abs: 2.2 10*3/uL (ref 0.7–4.0)
MCH: 26.7 pg (ref 26.0–34.0)
MCHC: 30.9 g/dL (ref 30.0–36.0)
MCV: 86.5 fL (ref 80.0–100.0)
Monocytes Absolute: 0.7 10*3/uL (ref 0.1–1.0)
Monocytes Relative: 7 %
Neutro Abs: 7.2 10*3/uL (ref 1.7–7.7)
Neutrophils Relative %: 69 %
Platelets: 376 10*3/uL (ref 150–400)
RBC: 4.75 MIL/uL (ref 3.87–5.11)
RDW: 13.5 % (ref 11.5–15.5)
WBC: 10.4 10*3/uL (ref 4.0–10.5)
nRBC: 0 % (ref 0.0–0.2)

## 2018-09-19 LAB — PROTIME-INR
INR: 1.1 (ref 0.8–1.2)
Prothrombin Time: 13.8 seconds (ref 11.4–15.2)

## 2018-09-19 LAB — BASIC METABOLIC PANEL
Anion gap: 9 (ref 5–15)
BUN: 12 mg/dL (ref 6–20)
CO2: 25 mmol/L (ref 22–32)
Calcium: 8.8 mg/dL — ABNORMAL LOW (ref 8.9–10.3)
Chloride: 102 mmol/L (ref 98–111)
Creatinine, Ser: 0.75 mg/dL (ref 0.44–1.00)
GFR calc Af Amer: >60 mL/min (ref >60)
GFR calc non Af Amer: >60 mL/min (ref >60)
Glucose, Bld: 118 mg/dL — ABNORMAL HIGH (ref 70–99)
Potassium: 3.5 mmol/L (ref 3.5–5.1)
Sodium: 136 mmol/L (ref 135–145)

## 2018-09-19 MED ORDER — ACETAMINOPHEN-CODEINE #3 300-30 MG PO TABS: 1.0000 | ORAL_TABLET | Freq: Four times a day (QID) | ORAL | 0 refills | Status: DC | PRN

## 2018-09-19 MED ORDER — ACETAMINOPHEN-CODEINE #3 300-30 MG PO TABS
2.0000 | ORAL_TABLET | Freq: Once | ORAL | Status: AC
  Administered 2018-09-19: 2 via ORAL
  Filled 2018-09-19: qty 2

## 2018-09-19 MED ORDER — MORPHINE SULFATE (PF) 4 MG/ML IV SOLN
4.0000 mg | Freq: Once | INTRAVENOUS | Status: AC
  Administered 2018-09-19: 21:00:00 4 mg via INTRAVENOUS
  Filled 2018-09-19: qty 1

## 2018-09-19 NOTE — ED Notes (Signed)
Splint applied by Dr. Melina Copa.

## 2018-09-19 NOTE — ED Triage Notes (Signed)
Pt stepped in a hole and rolled her right ankle, deformity noted, pt reports 2 previous breaks to left ankle in past

## 2018-09-19 NOTE — ED Notes (Signed)
Patient transported to X-ray 

## 2018-09-19 NOTE — Discharge Instructions (Addendum)
You were seen in the emergency department for right ankle injury after a fall.  Your x-rays show that you had a distal fibular fracture and this was splinted.  Please follow-up with Dr. Gladstone Lighter Thursday at 8 AM at [facility].  We are prescribing Tylenol with codeine at your request for pain.  Keep your leg elevated and use ice for the next 24 hours.  Do not put any weight on your leg until seen by orthopedics.  Keep your splint on and clean and dry.  Return to the emergency department if any concerns.

## 2018-09-19 NOTE — ED Notes (Signed)
Dr. Gladstone Lighter sent to Dr. Melina Copa.

## 2018-09-19 NOTE — ED Provider Notes (Signed)
[facility] EMERGENCY DEPARTMENT Provider Note   CSN: [id number] Arrival date & time: 09/19/18  2056     History   Chief Complaint Chief Complaint  Patient presents with  . Ankle Pain    HPI Courtney Joseph is a 43 y.o. female.  She said she was walking her dog when she stepped in a hole and rolled her right ankle with positive deformity.  She is complaining of moderate to severe pain worse with any type of movement.  She has been unable to ambulate since the fall.  She denies any other injury or complaints.  No numbness no weakness.     The history is provided by the patient.  Ankle Pain Location:  Ankle Injury: yes   Mechanism of injury: fall   Fall:    Fall occurred:  Walking   Impact surface:  Grass Ankle location:  R ankle Pain details:    Quality:  Throbbing   Radiates to:  Does not radiate   Severity:  Severe   Onset quality:  Sudden   Timing:  Constant   Progression:  Unchanged Chronicity:  New Dislocation: yes   Foreign body present:  No foreign bodies Relieved by:  Nothing Worsened by:  Rotation and flexion Ineffective treatments:  Immobilization Associated symptoms: decreased ROM and swelling   Associated symptoms: no back pain, no fever, no neck pain, no numbness and no tingling     Past Medical History:  Diagnosis Date  . Anxiety   . Bone spur   . Broken ankle 03/2018  . Chronic kidney disease    h/o kidney stones  . Depression   . Fatty liver   . Gastric erosions   . Headache    migraines    Patient Active Problem List   Diagnosis Date Noted  . Depression, recurrent ([place]) 07/27/2018  . Migraine 08/02/2017    Past Surgical History:  Procedure Laterality Date  . BONE EXCISION Left 10/30/2015   Procedure: EXCISION OF LEFT CALCANEOUS BONE SPUR AT ANTERIOR PROCESS;  Surgeon: Wylene Simmer, MD;  Location: [facility];  Service: Orthopedics;  Laterality: Left;  . uterine ablation    . WISDOM TOOTH EXTRACTION       OB History    No obstetric history on file.      Home Medications    Prior to Admission medications   Medication Sig Start Date End Date Taking? Authorizing Provider  ALPRAZolam Duanne Moron) 0.25 MG tablet Take 0.25 mg by mouth at bedtime as needed for anxiety.    [provider]  cetirizine (ZYRTEC) 10 MG tablet Take 1 tablet (10 mg total) by mouth daily. 11/24/17   Hawks, Theador Hawthorne, FNP  Erenumab-aooe (AIMOVIG) 70 MG/ML SOAJ Inject 70 mg into the skin every 30 (thirty) days. 07/27/18   Baruch Gouty, FNP  escitalopram (LEXAPRO) 20 MG tablet Take 1 tablet (20 mg total) by mouth daily. 07/27/18   Baruch Gouty, FNP  fluticasone (FLONASE) 50 MCG/ACT nasal spray Place 2 sprays into both nostrils daily. 08/01/18   Baruch Gouty, FNP  hyoscyamine (LEVSIN SL) 0.125 MG SL tablet Place 1 tablet (0.125 mg total) under the tongue every 6 (six) hours as needed. 12/30/17   Levin Erp, PA  omeprazole (PRILOSEC) 40 MG capsule Take 1 capsule (40 mg total) by mouth 2 (two) times daily. Take 30-60 minutes before breakfast and dinner 06/01/18   Irene Shipper, MD  promethazine (PHENERGAN) 25 MG tablet Take 0.5-1 tablets (12.5-25 mg  total) by mouth every 8 (eight) hours as needed for nausea or vomiting. 04/03/18   Janora Norlander, DO  SUMAtriptan (IMITREX) 100 MG tablet Take 1 tablet (100 mg total) by mouth every 2 (two) hours as needed for migraine. May repeat in 2 hours if headache persists or recurs. 07/27/18   Baruch Gouty, FNP    Family History Family History  Problem Relation Age of Onset  . Hypertension Mother   . Depression Father   . Hypertension Father   . Depression Brother   . Learning disabilities Brother   . Cancer Maternal Aunt   . Diabetes Paternal Uncle   . Early death Maternal Grandfather   . Heart disease Maternal Grandfather   . Cancer Paternal Grandmother   . Depression Paternal Grandmother   . Diabetes Paternal Grandmother   . Heart disease Paternal Grandfather   . Colon  cancer Neg Hx   . Esophageal cancer Neg Hx     Social History Social History   Tobacco Use  . Smoking status: Former Smoker    Quit date: 10/15/2011    Years since quitting: 6.9  . Smokeless tobacco: Never Used  Substance Use Topics  . Alcohol use: Yes    Comment: Very RARE  . Drug use: No     Allergies   Galactose, Other, Sulfasalazine, Erythromycin, Keflex [cephalexin], Nsaids, Penicillins, Sulfa antibiotics, and Tolmetin   Review of Systems Review of Systems  Constitutional: Negative for fever.  HENT: Negative for sore throat.   Eyes: Negative for visual disturbance.  Respiratory: Negative for shortness of breath.   Cardiovascular: Negative for chest pain.  Gastrointestinal: Negative for abdominal pain.  Genitourinary: Negative for dysuria.  Musculoskeletal: Negative for back pain and neck pain.  Skin: Negative for rash.  Neurological: Negative for headaches.     Physical Exam Updated Vital Signs BP (!) 172/86 (BP Location: Left Arm)   Pulse 69   Temp 99.2 F (37.3 C) (Oral)   Resp 16   Ht 5' 7"  (1.702 m)   Wt 88.5 kg   SpO2 100%   BMI 30.54 kg/m   Physical Exam Vitals signs and nursing note reviewed.  Constitutional:      General: She is not in acute distress.    Appearance: She is well-developed.  HENT:     Head: Normocephalic and atraumatic.  Eyes:     Conjunctiva/sclera: Conjunctivae normal.  Neck:     Musculoskeletal: Neck supple.  Cardiovascular:     Rate and Rhythm: Normal rate and regular rhythm.     Heart sounds: No murmur.  Pulmonary:     Effort: Pulmonary effort is normal. No respiratory distress.     Breath sounds: Normal breath sounds.  Abdominal:     Palpations: Abdomen is soft.     Tenderness: There is no abdominal tenderness.  Musculoskeletal:        General: Swelling, tenderness, deformity and signs of injury present.     Comments: Patient's upper extremities and left lower extremity full range of motion without any pain or  limitation.  She has a gross deformity of her right ankle mostly on the lateral mall side.  DP pulse sensation and distal motor function intact.  Hip and knee nontender.  Skin:    General: Skin is warm and dry.     Capillary Refill: Capillary refill takes less than 2 seconds.  Neurological:     General: No focal deficit present.     Mental Status: She is alert  and oriented to person, place, and time.      ED Treatments / Results  Labs (all labs ordered are listed, but only abnormal results are displayed) Labs Reviewed  BASIC METABOLIC PANEL - Abnormal; Notable for the following components:      Result Value   Glucose, Bld 118 (*)    Calcium 8.8 (*)    All other components within normal limits  CBC WITH DIFFERENTIAL/PLATELET  PROTIME-INR    EKG None  Radiology Dg Tibia/fibula Right  Result Date: 09/19/2018 CLINICAL DATA:  Right foot and lower leg pain after injury. Stepped in a hole while walking dog rolling ankle. Ankle deformity. EXAM: RIGHT TIBIA AND FIBULA - 2 VIEW COMPARISON:  Ankle radiographs performed concurrently. FINDINGS: Distal fibular tip fracture better assessed on concurrent ankle exam. Remainder of the fibula is intact. The cortical margins of the tibia are intact. No additional acute fracture. Soft tissue edema is most prominent at the fracture site. IMPRESSION: Distal fibular tip fracture better assessed on concurrent ankle exam. No additional acute fracture of the right lower leg. Electronically Signed   By: Keith Rake M.D.   On: 09/19/2018 21:45   Dg Ankle Complete Right  Result Date: 09/19/2018 CLINICAL DATA:  Right foot and lower leg pain after injury. Stepped in a hole while walking dog rolling ankle. Ankle deformity. EXAM: RIGHT ANKLE - COMPLETE 3+ VIEW COMPARISON:  None. FINDINGS: Mildly displaced distal fibular fracture distal to the ankle mortise. No additional acute fracture of the ankle. The ankle mortise is preserved. Significant soft tissue edema  laterally. Possible small ankle joint effusion. IMPRESSION: Mildly displaced distal fibular fracture distal to the ankle mortise with associated soft tissue edema. Electronically Signed   By: Keith Rake M.D.   On: 09/19/2018 21:43    Procedures .Splint Application  Date/Time: 09/20/2018 10:09 AM Performed by: Hayden Rasmussen, MD Authorized by: Hayden Rasmussen, MD   Consent:    Consent obtained:  Verbal   Consent given by:  Patient   Risks discussed:  Discoloration, pain, swelling and numbness   Alternatives discussed:  No treatment and alternative treatment Pre-procedure details:    Sensation:  Normal   Skin color:  Pink Procedure details:    Laterality:  Right   Location:  Ankle   Ankle:  R ankle   Splint type:  Sugar tong and short leg   Supplies:  Ortho-Glass Post-procedure details:    Pain:  Improved   Sensation:  Normal   Skin color:  Pink   Patient tolerance of procedure:  Tolerated well, no immediate complications   (including critical care time)  Medications Ordered in ED Medications  morphine 4 MG/ML injection 4 mg (has no administration in time range)     Initial Impression / Assessment and Plan / ED Course  I have reviewed the triage vital signs and the nursing notes.  Pertinent labs & imaging results that were available during my care of the patient were reviewed by me and considered in my medical decision making (see chart for details).  Clinical Course as of Sep 19 1005  Tue Sep 19, 2018  2201 Discussed with Dr. Gladstone [facility]ighter [facility].  He is recommended the patient go in a well-padded splint and if she can handle it on her stomach to take an aspirin twice a day and follow-up in the clinic with him on Thursday at 8:30 AM.   [MB]    Clinical Course User Index [MB] Hayden Rasmussen, MD  ddx -fracture, dislocation, sprain     Final Clinical Impressions(s) / ED Diagnoses   Final diagnoses:  Closed nondisplaced fracture of lateral  malleolus of right fibula, initial encounter  Fall, initial encounter    ED Discharge Orders         Ordered    acetaminophen-codeine (TYLENOL #3) 300-30 MG tablet  Every 6 hours PRN     09/19/18 2240           Hayden Rasmussen, MD 09/20/18 1009

## 2018-10-09 DIAGNOSIS — S82839A Other fracture of upper and lower end of unspecified fibula, initial encounter for closed fracture: Secondary | ICD-10-CM | POA: Insufficient documentation

## 2018-10-26 ENCOUNTER — Other Ambulatory Visit: Payer: Self-pay

## 2018-10-27 ENCOUNTER — Ambulatory Visit: Payer: BC Managed Care – PPO | Admitting: Family Medicine

## 2018-10-27 ENCOUNTER — Encounter: Payer: Self-pay | Admitting: Family Medicine

## 2018-10-27 VITALS — BP 125/78 | HR 78 | Temp 98.2°F | Resp 20 | Ht 67.0 in | Wt 199.0 lb

## 2018-10-27 DIAGNOSIS — J301 Allergic rhinitis due to pollen: Secondary | ICD-10-CM | POA: Insufficient documentation

## 2018-10-27 DIAGNOSIS — G43709 Chronic migraine without aura, not intractable, without status migrainosus: Secondary | ICD-10-CM | POA: Diagnosis not present

## 2018-10-27 DIAGNOSIS — Z8781 Personal history of (healed) traumatic fracture: Secondary | ICD-10-CM | POA: Insufficient documentation

## 2018-10-27 DIAGNOSIS — Z13820 Encounter for screening for osteoporosis: Secondary | ICD-10-CM | POA: Diagnosis not present

## 2018-10-27 MED ORDER — ACETAMINOPHEN-CODEINE #3 300-30 MG PO TABS: 1.0000 | ORAL_TABLET | Freq: Four times a day (QID) | ORAL | 0 refills | Status: DC | PRN

## 2018-10-27 MED ORDER — CETIRIZINE HCL 10 MG PO TABS: 10.0000 mg | ORAL_TABLET | Freq: Every day | ORAL | 11 refills | Status: DC

## 2018-10-27 NOTE — Progress Notes (Signed)
Subjective:    Courtney Joseph is a 43 y.o. female who presents for follow-up of migraine headaches. Home treatment has included Imitrex oral and erunamab-aooe injections with some improvement. Headaches are occurring a few times a month. Generally, the headaches last about 1 day. Work attendance or other daily activities are not affected by the headaches. The patient denies dizziness, loss of balance, muscle weakness, numbness of extremities, speech difficulties and vomiting in the early morning.  She reports concerns for decreased bone density due to hx of ankle fracture in both ankles this year and had a fracture in her left ankle 5 years previously. She occasionally take a calcium and vit D supplement but not consistently. She denies personal or family hx or osteoporosis or osteopenia. Her LMP ws 06/2011 following an ablation.   The following portions of the patient's history were reviewed and updated as appropriate: past social history and past surgical history.  Review of Systems Constitutional: negative Eyes: negative Ears, nose, mouth, throat, and face: negative Respiratory: negative Cardiovascular: negative Gastrointestinal: negative Neurological: positive for headaches Behavioral/Psych: negative    Objective:    General appearance: alert, cooperative, appears stated age and no distress Head: Normocephalic, without obvious abnormality, atraumatic Eyes: conjunctivae/corneas clear. PERRL, EOM's intact. Fundi benign. Ears: normal TM's and external ear canals both ears Nose: Nares normal. Septum midline. Mucosa normal. No drainage or sinus tenderness. Throat: lips, mucosa, and tongue normal; teeth and gums normal Neck: no adenopathy, no carotid bruit, no JVD, supple, symmetrical, trachea midline and thyroid not enlarged, symmetric, no tenderness/mass/nodules Lungs: clear to auscultation bilaterally Heart: regular rate and rhythm, S1, S2 normal, no murmur, click, rub or gallop  Abdomen: soft, non-tender; bowel sounds normal; no masses,  no organomegaly Extremities: extremities normal, atraumatic, no cyanosis or edema Skin: Skin color, texture, turgor normal. No rashes or lesions Neurologic: Mental status: Alert, oriented, thought content appropriate Gait: Normal    Assessment:   Courtney Joseph was seen today for medical management of chronic issues.  Diagnoses and all orders for this visit:  Chronic migraine without aura without status migrainosus, not intractable Doing great on Aimovig monthly injections and as needed imitrex. Down to 1 headache day per month. Will continue therapy.   History of fracture of right ankle History of fracture of left ankle Fracture x1 of the right ankle, and fracture x2 of left ankle. Concerning for osteoporosis.  -     DG Bone Density; Future -     acetaminophen-codeine (TYLENOL #3) 300-30 MG tablet; Take 1-2 tablets by mouth every 6 (six) hours as needed for moderate pain.  Encounter for screening for osteoporosis -     DG Bone Density; Future  Seasonal allergic rhinitis due to pollen -     cetirizine (ZYRTEC) 10 MG tablet; Take 1 tablet (10 mg total) by mouth daily.     Plan:    Continue present treatment and plan. Lie in darkened room and apply cold packs as needed for migraine pain. Bone density scan to assess for osteoporosis.  Follow up in 6 months.    The above assessment and management plan was discussed with the patient. The patient verbalized understanding of and has agreed to the management plan. Patient is aware to call the clinic if they develop any new symptoms or if symptoms fail to improve or worsen. Patient is aware when to return to the clinic for a follow-up visit. Patient educated on when it is appropriate to go to the emergency department.  Marjorie Smolder, RN, FNP student [facility] [address] [place], [place] [address] [phone]  I personally was present during the  history, physical exam, and medical decision-making activities of this service and have verified that the service and findings are accurately documented in the nurse practitioner student's note.  Monia Pouch, FNP-C [facility] [address] [place], [place] [address] [phone]

## 2018-10-27 NOTE — Patient Instructions (Signed)

## 2018-11-01 ENCOUNTER — Encounter: Payer: Self-pay | Admitting: Family Medicine

## 2018-11-08 ENCOUNTER — Other Ambulatory Visit: Payer: Self-pay

## 2018-11-08 ENCOUNTER — Ambulatory Visit (HOSPITAL_COMMUNITY)
Admission: RE | Admit: 2018-11-08 | Discharge: 2018-11-08 | Disposition: A | Discharge status: Home / Self Care | Payer: BC Managed Care – PPO | Source: Ambulatory Visit | Attending: Family Medicine | Admitting: Family Medicine

## 2018-11-08 DIAGNOSIS — Z13820 Encounter for screening for osteoporosis: Secondary | ICD-10-CM | POA: Diagnosis present

## 2018-11-08 DIAGNOSIS — Z8781 Personal history of (healed) traumatic fracture: Secondary | ICD-10-CM | POA: Diagnosis not present

## 2019-02-16 ENCOUNTER — Other Ambulatory Visit: Payer: Self-pay | Admitting: Family Medicine

## 2019-02-16 DIAGNOSIS — G43709 Chronic migraine without aura, not intractable, without status migrainosus: Secondary | ICD-10-CM

## 2019-02-19 NOTE — Telephone Encounter (Signed)
OV 10/27/18 rtc 6 mos

## 2019-03-18 ENCOUNTER — Other Ambulatory Visit: Payer: Self-pay | Admitting: Family Medicine

## 2019-03-18 DIAGNOSIS — Z8781 Personal history of (healed) traumatic fracture: Secondary | ICD-10-CM

## 2019-03-20 ENCOUNTER — Other Ambulatory Visit: Payer: Self-pay | Admitting: Family Medicine

## 2019-03-20 DIAGNOSIS — Z8781 Personal history of (healed) traumatic fracture: Secondary | ICD-10-CM

## 2019-03-29 ENCOUNTER — Encounter: Payer: Self-pay | Admitting: *Deleted

## 2019-04-27 ENCOUNTER — Other Ambulatory Visit: Payer: Self-pay

## 2019-04-27 ENCOUNTER — Ambulatory Visit: Payer: BC Managed Care – PPO | Admitting: Family Medicine

## 2019-04-27 ENCOUNTER — Encounter: Payer: Self-pay | Admitting: Family Medicine

## 2019-04-27 VITALS — BP 127/82 | HR 83 | Temp 98.9°F | Ht 67.0 in | Wt 201.8 lb

## 2019-04-27 DIAGNOSIS — G43709 Chronic migraine without aura, not intractable, without status migrainosus: Secondary | ICD-10-CM | POA: Diagnosis not present

## 2019-04-27 DIAGNOSIS — F339 Major depressive disorder, recurrent, unspecified: Secondary | ICD-10-CM

## 2019-04-27 MED ORDER — PREDNISONE 20 MG PO TABS: ORAL_TABLET | ORAL | 0 refills | Status: DC

## 2019-04-27 MED ORDER — KETOROLAC TROMETHAMINE 30 MG/ML IJ SOLN
30.0000 mg | Freq: Once | INTRAMUSCULAR | Status: AC
  Administered 2019-04-27: 30 mg via INTRAMUSCULAR

## 2019-04-27 MED ORDER — AIMOVIG 70 MG/ML ~~LOC~~ SOAJ: 70.0000 mg | SUBCUTANEOUS | 11 refills | Status: DC

## 2019-04-27 MED ORDER — SUMATRIPTAN SUCCINATE 100 MG PO TABS: 100.0000 mg | ORAL_TABLET | ORAL | 5 refills | Status: DC | PRN

## 2019-04-27 NOTE — Progress Notes (Signed)
Subjective:  Patient ID: Courtney Joseph, female    DOB: [birth year], 44 y.o.   MRN: [id number]  Patient Care Team: Janora Norlander, DO as PCP - General (Family Medicine)   Chief Complaint:  Follow-up (MIGRAINES)   HPI: Courtney Joseph is a 44 y.o. female presenting on 04/27/2019 for Follow-up (MIGRAINES)   1. Chronic migraine without aura without status migrainosus, not intractable Pt states she has been doing fairly well with the once monthly Aimovig. States she has had a headache for the last 5 days. Feels this may be related to the season changes. She has been using Flonase and taking Zyrtec on a nightly basis. She has added tylenol and motrin over the last few days without relief of headache. She reports the headache is frontal, dull to squeezing, and a 5/10 at present. No photophobia, phonophobia, nausea, or vomiting. No dizziness or weakness. Has had some nasal congestion and tinnitus in her left ear.   2. Depression, recurrent ([place]) Pt feels her depression is well controlled, states she has had a lot going on. Denies the need to adjust medications at this time. No SI or HI.  Depression screen [facility] 2/9 04/27/2019 04/27/2019 10/27/2018 07/27/2018 04/11/2018  Decreased Interest 0 0 0 0 0  Down, Depressed, Hopeless 0 0 0 0 0  PHQ - 2 Score 0 0 0 0 0  Altered sleeping 0 - - 2 -  Tired, decreased energy 0 - - 2 -  Change in appetite 0 - - 0 -  Feeling bad or failure about yourself  0 - - 0 -  Trouble concentrating 0 - - 1 -  Moving slowly or fidgety/restless 0 - - 0 -  Suicidal thoughts 0 - - 0 -  PHQ-9 Score 0 - - 5 -  Difficult doing work/chores - - - - -  Some recent data might be hidden        Relevant past medical, surgical, family, and social history reviewed and updated as indicated.  Allergies and medications reviewed and updated. Date reviewed: Chart in Epic.   Past Medical History:  Diagnosis Date  . Anxiety   . Bone spur   . Broken ankle 03/2018  . Chronic kidney  disease    h/o kidney stones  . Depression   . Fatty liver   . Gastric erosions   . Headache    migraines    Past Surgical History:  Procedure Laterality Date  . BONE EXCISION Left 10/30/2015   Procedure: EXCISION OF LEFT CALCANEOUS BONE SPUR AT ANTERIOR PROCESS;  Surgeon: Wylene Simmer, MD;  Location: [facility];  Service: Orthopedics;  Laterality: Left;  . right ankle break  Right 09/19/2018  . uterine ablation    . WISDOM TOOTH EXTRACTION      Social History   Socioeconomic History  . Marital status: Divorced    Spouse name: Not on file  . Number of children: Not on file  . Years of education: Not on file  . Highest education level: Not on file  Occupational History  . Occupation: staff account  Tobacco Use  . Smoking status: Former Smoker    Quit date: 10/15/2011    Years since quitting: 7.5  . Smokeless tobacco: Never Used  Substance and Sexual Activity  . Alcohol use: Yes    Comment: Very RARE  . Drug use: No  . Sexual activity: Never  Other Topics Concern  . Not on file  Social History Narrative  .  Not on file   Social Determinants of Health   Financial Resource Strain:   . Difficulty of Paying Living Expenses:   Food Insecurity:   . Worried About Charity fundraiser in the Last Year:   . Arboriculturist in the Last Year:   Transportation Needs:   . Film/video editor (Medical):   Marland Kitchen Lack of Transportation (Non-Medical):   Physical Activity:   . Days of Exercise per Week:   . Minutes of Exercise per Session:   Stress:   . Feeling of Stress :   Social Connections:   . Frequency of Communication with Friends and Family:   . Frequency of Social Gatherings with Friends and Family:   . Attends Religious Services:   . Active Member of Clubs or Organizations:   . Attends Archivist Meetings:   Marland Kitchen Marital Status:   Intimate Partner Violence:   . Fear of Current or Ex-Partner:   . Emotionally Abused:   Marland Kitchen Physically Abused:   .  Sexually Abused:     Outpatient Encounter Medications as of 04/27/2019  Medication Sig  . acetaminophen-codeine (TYLENOL #3) 300-30 MG tablet Take 1-2 tablets by mouth every 6 (six) hours as needed for moderate pain.  . cetirizine (ZYRTEC) 10 MG tablet Take 1 tablet (10 mg total) by mouth daily.  Eduard Roux (AIMOVIG) 70 MG/ML SOAJ Inject 70 mg into the skin every 30 (thirty) days.  Marland Kitchen escitalopram (LEXAPRO) 20 MG tablet Take 1 tablet (20 mg total) by mouth daily.  . fluticasone (FLONASE) 50 MCG/ACT nasal spray Place 2 sprays into both nostrils daily.  . hyoscyamine (LEVSIN SL) 0.125 MG SL tablet Place 1 tablet (0.125 mg total) under the tongue every 6 (six) hours as needed.  Marland Kitchen omeprazole (PRILOSEC) 40 MG capsule Take 1 capsule (40 mg total) by mouth 2 (two) times daily. Take 30-60 minutes before breakfast and dinner  . predniSONE (DELTASONE) 20 MG tablet 2 po at sametime daily for 5 days  . promethazine (PHENERGAN) 25 MG tablet Take 0.5-1 tablets (12.5-25 mg total) by mouth every 8 (eight) hours as needed for nausea or vomiting.  . SUMAtriptan (IMITREX) 100 MG tablet Take 1 tablet (100 mg total) by mouth every 2 (two) hours as needed for migraine. May repeat in 2 hours if headache persists or recurs.  . [DISCONTINUED] AIMOVIG 70 MG/ML SOAJ INJECT 70 MG (TOTAL 1 ML) INTO THE SKIN EVERY 30 DAYS  . [DISCONTINUED] SUMAtriptan (IMITREX) 100 MG tablet Take 1 tablet (100 mg total) by mouth every 2 (two) hours as needed for migraine. May repeat in 2 hours if headache persists or recurs.  . [EXPIRED] ketorolac (TORADOL) 30 MG/ML injection 30 mg    No facility-administered encounter medications on file as of 04/27/2019.    Allergies  Allergen Reactions  . Galactose Anaphylaxis  . Other Anaphylaxis    Red meat, pork, lamb, deer meat  . Sulfasalazine Hives  . Erythromycin Hives  . Keflex [Cephalexin] Hives  . Nsaids     Has gastric ulcer  . Penicillins Hives  . Sulfa Antibiotics Hives  .  Tolmetin     Has gastric ulcer    Review of Systems  Constitutional: Negative for activity change, appetite change, chills, diaphoresis, fatigue, fever and unexpected weight change.  HENT: Positive for congestion, postnasal drip and tinnitus. Negative for rhinorrhea.   Eyes: Negative.  Negative for photophobia and visual disturbance.  Respiratory: Negative for cough, chest tightness and shortness of breath.  Cardiovascular: Negative for chest pain, palpitations and leg swelling.  Gastrointestinal: Negative for abdominal pain, blood in stool, constipation, diarrhea, nausea and vomiting.  Endocrine: Negative.  Negative for polydipsia, polyphagia and polyuria.  Genitourinary: Negative for decreased urine volume, difficulty urinating, dysuria, frequency and urgency.  Musculoskeletal: Negative for arthralgias and myalgias.  Skin: Negative.   Allergic/Immunologic: Negative.   Neurological: Positive for headaches. Negative for dizziness, tremors, seizures, syncope, facial asymmetry, speech difficulty, weakness, light-headedness and numbness.  Hematological: Negative.   Psychiatric/Behavioral: Negative for confusion, hallucinations, sleep disturbance and suicidal ideas.  All other systems reviewed and are negative.       Objective:  BP 127/82   Pulse 83   Temp 98.9 F (37.2 C)   Ht 5' 7"  (1.702 m)   Wt 201 lb 12.8 oz (91.5 kg)   SpO2 96%   BMI 31.61 kg/m    Wt Readings from Last 3 Encounters:  04/27/19 201 lb 12.8 oz (91.5 kg)  10/27/18 199 lb (90.3 kg)  09/19/18 195 lb (88.5 kg)    Physical Exam Vitals and nursing note reviewed.  Constitutional:      General: She is not in acute distress.    Appearance: Normal appearance. She is well-developed and well-groomed. She is obese. She is not ill-appearing, toxic-appearing or diaphoretic.  HENT:     Head: Normocephalic and atraumatic.     Jaw: There is normal jaw occlusion.     Right Ear: Hearing, tympanic membrane, ear canal and  external ear normal.     Left Ear: Hearing, tympanic membrane, ear canal and external ear normal.     Nose: Nose normal.     Mouth/Throat:     Lips: Pink.     Mouth: Mucous membranes are moist.     Pharynx: Oropharynx is clear. Uvula midline.  Eyes:     General: Lids are normal.     Extraocular Movements: Extraocular movements intact.     Conjunctiva/sclera: Conjunctivae normal.     Pupils: Pupils are equal, round, and reactive to light.  Neck:     Thyroid: No thyroid mass, thyromegaly or thyroid tenderness.     Vascular: No carotid bruit or JVD.     Trachea: Trachea and phonation normal.  Cardiovascular:     Rate and Rhythm: Normal rate and regular rhythm.     Chest Wall: PMI is not displaced.     Pulses: Normal pulses.     Heart sounds: Normal heart sounds. No murmur. No friction rub. No gallop.   Pulmonary:     Effort: Pulmonary effort is normal. No respiratory distress.     Breath sounds: Normal breath sounds. No wheezing.  Abdominal:     General: Bowel sounds are normal. There is no distension or abdominal bruit.     Palpations: Abdomen is soft. There is no hepatomegaly or splenomegaly.     Tenderness: There is no abdominal tenderness. There is no right CVA tenderness or left CVA tenderness.     Hernia: No hernia is present.  Musculoskeletal:        General: Normal range of motion.     Cervical back: Normal range of motion and neck supple.     Right lower leg: No edema.     Left lower leg: No edema.  Lymphadenopathy:     Cervical: No cervical adenopathy.  Skin:    General: Skin is warm and dry.     Capillary Refill: Capillary refill takes less than 2 seconds.  Coloration: Skin is not cyanotic, jaundiced or pale.     Findings: No rash.  Neurological:     General: No focal deficit present.     Mental Status: She is alert and oriented to person, place, and time.     Cranial Nerves: Cranial nerves are intact. No cranial nerve deficit.     Sensory: Sensation is intact.  No sensory deficit.     Motor: Motor function is intact. No weakness.     Coordination: Coordination is intact. Coordination normal.     Gait: Gait is intact. Gait normal.     Deep Tendon Reflexes: Reflexes are normal and symmetric. Reflexes normal.  Psychiatric:        Attention and Perception: Attention and perception normal.        Mood and Affect: Mood and affect normal.        Speech: Speech normal.        Behavior: Behavior normal. Behavior is cooperative.        Thought Content: Thought content normal.        Cognition and Memory: Cognition and memory normal.        Judgment: Judgment normal.     Results for orders placed or performed during the hospital encounter of 92/11/94  Basic metabolic panel  Result Value Ref Range   Sodium 136 135 - 145 mmol/L   Potassium 3.5 3.5 - 5.1 mmol/L   Chloride 102 98 - 111 mmol/L   CO2 25 22 - 32 mmol/L   Glucose, Bld 118 (H) 70 - 99 mg/dL   BUN 12 6 - 20 mg/dL   Creatinine, Ser 0.75 0.44 - 1.00 mg/dL   Calcium 8.8 (L) 8.9 - 10.3 mg/dL   GFR calc non Af Amer >60 >60 mL/min   GFR calc Af Amer >60 >60 mL/min   Anion gap 9 5 - 15  CBC with Differential  Result Value Ref Range   WBC 10.4 4.0 - 10.5 K/uL   RBC 4.75 3.87 - 5.11 MIL/uL   Hemoglobin 12.7 12.0 - 15.0 g/dL   HCT 41.1 36.0 - 46.0 %   MCV 86.5 80.0 - 100.0 fL   MCH 26.7 26.0 - 34.0 pg   MCHC 30.9 30.0 - 36.0 g/dL   RDW 13.5 11.5 - 15.5 %   Platelets 376 150 - 400 K/uL   nRBC 0.0 0.0 - 0.2 %   Neutrophils Relative % 69 %   Neutro Abs 7.2 1.7 - 7.7 K/uL   Lymphocytes Relative 21 %   Lymphs Abs 2.2 0.7 - 4.0 K/uL   Monocytes Relative 7 %   Monocytes Absolute 0.7 0.1 - 1.0 K/uL   Eosinophils Relative 2 %   Eosinophils Absolute 0.2 0.0 - 0.5 K/uL   Basophils Relative 1 %   Basophils Absolute 0.1 0.0 - 0.1 K/uL   Immature Granulocytes 0 %   Abs Immature Granulocytes 0.02 0.00 - 0.07 K/uL  Protime-INR  Result Value Ref Range   Prothrombin Time 13.8 11.4 - 15.2 seconds    INR 1.1 0.8 - 1.2       Pertinent labs & imaging results that were available during my care of the patient were reviewed by me and considered in my medical decision making.  Assessment & Plan:  Tiffony was seen today for follow-up.  Diagnoses and all orders for this visit:  Chronic migraine without aura without status migrainosus, not intractable Doing well on Aimovig monthly and abortive Imitrex. Has had a headache for  last 5 days. Likely related to season changes. Will burst with steroids to see if beneficial. Toradol injection in office today. Pt aware to report any new, worsening, or persistent symptoms.  -     SUMAtriptan (IMITREX) 100 MG tablet; Take 1 tablet (100 mg total) by mouth every 2 (two) hours as needed for migraine. May repeat in 2 hours if headache persists or recurs. -     predniSONE (DELTASONE) 20 MG tablet; 2 po at sametime daily for 5 days -     Erenumab-aooe (AIMOVIG) 70 MG/ML SOAJ; Inject 70 mg into the skin every 30 (thirty) days. -     ketorolac (TORADOL) 30 MG/ML injection 30 mg  Depression, recurrent ([facility]) Pt reports good control. No SI or HI. Continue current regimen.     Continue all other maintenance medications.  Follow up plan: Return in about 6 weeks (around 06/08/2019), or if symptoms worsen or fail to improve, for headaches, CPE.   Continue healthy lifestyle choices, including diet (rich in fruits, vegetables, and lean proteins, and low in salt and simple carbohydrates) and exercise (at least 30 minutes of moderate physical activity daily).  Educational handout given for migraine headache  The above assessment and management plan was discussed with the patient. The patient verbalized understanding of and has agreed to the management plan. Patient is aware to call the clinic if they develop any new symptoms or if symptoms persist or worsen. Patient is aware when to return to the clinic for a follow-up visit. Patient educated on when it is appropriate to  go to the emergency department.   Monia Pouch, FNP-C [facility] [phone]

## 2019-04-27 NOTE — Patient Instructions (Signed)

## 2019-05-03 ENCOUNTER — Ambulatory Visit: Payer: BC Managed Care – PPO | Admitting: Family Medicine

## 2019-06-28 ENCOUNTER — Encounter: Payer: Self-pay | Admitting: Family Medicine

## 2019-06-28 ENCOUNTER — Ambulatory Visit (INDEPENDENT_AMBULATORY_CARE_PROVIDER_SITE_OTHER): Payer: BC Managed Care – PPO | Admitting: Family Medicine

## 2019-06-28 DIAGNOSIS — R5383 Other fatigue: Secondary | ICD-10-CM | POA: Diagnosis not present

## 2019-06-28 NOTE — Progress Notes (Signed)
Subjective:    Patient ID: Courtney Joseph, female    DOB: [birth year], 44 y.o.   MRN: [id number]   HPI: Courtney Joseph is a 44 y.o. female presenting for Not sleeping well. Takes all her energy to keep going just to get to work. Ongoing for 2 months. Tired all the time. GYN tests showed she is menopausal. Not taking HRT. Weight significantly up. Had Moderna CoVID shots MarchCoVID 11 & April 10. Some mayhave been going on since January. Vitamin B12 has been low. Takes oral B Complex when she remembers. Sleep quality is poor. Can't get to sleep and wakes up frequently.    Depression screen [facility] 2/9 04/27/2019 04/27/2019 10/27/2018 07/27/2018 04/11/2018  Decreased Interest 0 0 0 0 0  Down, Depressed, Hopeless 0 0 0 0 0  PHQ - 2 Score 0 0 0 0 0  Altered sleeping 0 - - 2 -  Tired, decreased energy 0 - - 2 -  Change in appetite 0 - - 0 -  Feeling bad or failure about yourself  0 - - 0 -  Trouble concentrating 0 - - 1 -  Moving slowly or fidgety/restless 0 - - 0 -  Suicidal thoughts 0 - - 0 -  PHQ-9 Score 0 - - 5 -  Difficult doing work/chores - - - - -  Some recent data might be hidden     Relevant past medical, surgical, family and social history reviewed and updated as indicated.  Interim medical history since our last visit reviewed. Allergies and medications reviewed and updated.  ROS:  Review of Systems  Constitutional: Positive for activity change and fatigue. Negative for appetite change, chills, diaphoresis and fever.  HENT: Negative.   Respiratory: Negative for cough and shortness of breath.   Cardiovascular: Negative for chest pain and leg swelling.  Gastrointestinal: Negative for abdominal pain.  Genitourinary: Negative for difficulty urinating.  Musculoskeletal: Positive for arthralgias (knees and ankles - ankles fractured in the past). Negative for myalgias.     Social History   Tobacco Use  Smoking Status Former Smoker  . Quit date: 10/15/2011  . Years since quitting: 7.7   Smokeless Tobacco Never Used       Objective:     Wt Readings from Last 3 Encounters:  04/27/19 201 lb 12.8 oz (91.5 kg)  10/27/18 199 lb (90.3 kg)  09/19/18 195 lb (88.5 kg)     Exam deferred. Pt. Harboring due to COVID 19. Phone visit performed.   Assessment & Plan:   1. Fatigue, unspecified type     No orders of the defined types were placed in this encounter.   Orders Placed This Encounter  Procedures  . CBC with Differential/Platelet  . CMP14+EGFR    Order Specific Question:   Has the patient fasted?    Answer:   Yes  . TSH  . VITAMIN D 25 Hydroxy (Vit-D Deficiency, Fractures)  . Vitamin B12  . Lyme Ab/Western Blot Reflex        Virtual Visit via telephone Note  I discussed the limitations, risks, security and privacy concerns of performing an evaluation and management service by telephone and the availability of in person appointments. The patient was identified with two identifiers. Pt.expressed understanding and agreed to proceed. Pt. Is at home. Dr. Livia Snellen is in his office.  Follow Up Instructions:   I discussed the assessment and treatment plan with the patient. The patient was provided an opportunity to ask questions and all were answered.  The patient agreed with the plan and demonstrated an understanding of the instructions.   The patient was advised to call back or seek an in-person evaluation if the symptoms worsen or if the condition fails to improve as anticipated.   Total minutes including chart review and phone contact time: 17   Follow up plan: Return in about 2 weeks (around 07/12/2019) for with Dr. Lajuana Ripple.  Claretta Fraise, MD [place]

## 2019-06-29 ENCOUNTER — Other Ambulatory Visit: Payer: Self-pay

## 2019-07-02 LAB — CBC WITH DIFFERENTIAL/PLATELET
Basophils Absolute: 0.1 10*3/uL (ref 0.0–0.2)
Basos: 1 %
EOS (ABSOLUTE): 0.2 10*3/uL (ref 0.0–0.4)
Eos: 3 %
Hematocrit: 42.3 % (ref 34.0–46.6)
Hemoglobin: 13.6 g/dL (ref 11.1–15.9)
Immature Grans (Abs): 0 10*3/uL (ref 0.0–0.1)
Immature Granulocytes: 0 %
Lymphocytes Absolute: 1.6 10*3/uL (ref 0.7–3.1)
Lymphs: 30 %
MCH: 26.8 pg (ref 26.6–33.0)
MCHC: 32.2 g/dL (ref 31.5–35.7)
MCV: 83 fL (ref 79–97)
Monocytes Absolute: 0.4 10*3/uL (ref 0.1–0.9)
Monocytes: 8 %
Neutrophils Absolute: 3.1 10*3/uL (ref 1.4–7.0)
Neutrophils: 58 %
Platelets: 359 10*3/uL (ref 150–450)
RBC: 5.08 x10E6/uL (ref 3.77–5.28)
RDW: 13.4 % (ref 11.7–15.4)
WBC: 5.4 10*3/uL (ref 3.4–10.8)

## 2019-07-02 LAB — CMP14+EGFR
ALT: 15 IU/L (ref 0–32)
AST: 14 IU/L (ref 0–40)
Albumin/Globulin Ratio: 1.4 (ref 1.2–2.2)
Albumin: 4.3 g/dL (ref 3.8–4.8)
Alkaline Phosphatase: 71 IU/L (ref 48–121)
BUN/Creatinine Ratio: 13 (ref 9–23)
BUN: 9 mg/dL (ref 6–24)
Bilirubin Total: 0.3 mg/dL (ref 0.0–1.2)
CO2: 23 mmol/L (ref 20–29)
Calcium: 9.9 mg/dL (ref 8.7–10.2)
Chloride: 103 mmol/L (ref 96–106)
Creatinine, Ser: 0.71 mg/dL (ref 0.57–1.00)
GFR calc Af Amer: 118 mL/min/{1.73_m2} (ref >59)
GFR calc non Af Amer: 102 mL/min/{1.73_m2} (ref >59)
Globulin, Total: 3 g/dL (ref 1.5–4.5)
Glucose: 99 mg/dL (ref 65–99)
Potassium: 4.4 mmol/L (ref 3.5–5.2)
Sodium: 138 mmol/L (ref 134–144)
Total Protein: 7.3 g/dL (ref 6.0–8.5)

## 2019-07-02 LAB — VITAMIN B12: Vitamin B-12: 418 pg/mL (ref 232–1245)

## 2019-07-02 LAB — LYME AB/WESTERN BLOT REFLEX
LYME DISEASE AB, QUANT, IGM: <0.8 index (ref 0.00–0.79)
Lyme IgG/IgM Ab: <0.91 {ISR} (ref 0.00–0.90)

## 2019-07-02 LAB — VITAMIN D 25 HYDROXY (VIT D DEFICIENCY, FRACTURES): Vit D, 25-Hydroxy: 26.7 ng/mL — ABNORMAL LOW (ref 30.0–100.0)

## 2019-07-02 LAB — TSH: TSH: 0.938 u[IU]/mL (ref 0.450–4.500)

## 2019-07-05 ENCOUNTER — Telehealth: Payer: Self-pay | Admitting: Family Medicine

## 2019-07-05 ENCOUNTER — Other Ambulatory Visit: Payer: Self-pay | Admitting: Family Medicine

## 2019-07-05 MED ORDER — VITAMIN D (ERGOCALCIFEROL) 1.25 MG (50000 UNIT) PO CAPS: 50000.0000 [IU] | ORAL_CAPSULE | ORAL | 3 refills | Status: DC

## 2019-07-05 NOTE — Telephone Encounter (Signed)
Please review labs. 

## 2019-07-05 NOTE — Telephone Encounter (Signed)
Pt would like a call to discuss her lab results.

## 2019-07-05 NOTE — Telephone Encounter (Signed)
All good except the vitamin D was low.  I sent in a prescription she should take weekly for a year.

## 2019-07-06 NOTE — Telephone Encounter (Signed)
Patient Aware and appt made to est care. With Dr. Lajuana Ripple

## 2019-07-25 ENCOUNTER — Encounter: Payer: Self-pay | Admitting: Family Medicine

## 2019-07-25 ENCOUNTER — Other Ambulatory Visit: Payer: Self-pay

## 2019-07-25 ENCOUNTER — Ambulatory Visit (INDEPENDENT_AMBULATORY_CARE_PROVIDER_SITE_OTHER): Payer: BC Managed Care – PPO | Admitting: Family Medicine

## 2019-07-25 VITALS — BP 116/76 | HR 77 | Temp 97.6°F | Ht 67.0 in | Wt 198.6 lb

## 2019-07-25 DIAGNOSIS — G43709 Chronic migraine without aura, not intractable, without status migrainosus: Secondary | ICD-10-CM | POA: Diagnosis not present

## 2019-07-25 DIAGNOSIS — F339 Major depressive disorder, recurrent, unspecified: Secondary | ICD-10-CM

## 2019-07-25 DIAGNOSIS — Z8781 Personal history of (healed) traumatic fracture: Secondary | ICD-10-CM

## 2019-07-25 DIAGNOSIS — Z7689 Persons encountering health services in other specified circumstances: Secondary | ICD-10-CM

## 2019-07-25 DIAGNOSIS — Z79899 Other long term (current) drug therapy: Secondary | ICD-10-CM

## 2019-07-25 DIAGNOSIS — J301 Allergic rhinitis due to pollen: Secondary | ICD-10-CM

## 2019-07-25 DIAGNOSIS — E559 Vitamin D deficiency, unspecified: Secondary | ICD-10-CM

## 2019-07-25 MED ORDER — ACETAMINOPHEN-CODEINE #3 300-30 MG PO TABS: 1.0000 | ORAL_TABLET | Freq: Four times a day (QID) | ORAL | 0 refills | Status: DC | PRN

## 2019-07-25 MED ORDER — ESCITALOPRAM OXALATE 20 MG PO TABS: 20.0000 mg | ORAL_TABLET | Freq: Every day | ORAL | 3 refills | Status: DC

## 2019-07-25 MED ORDER — CETIRIZINE HCL 10 MG PO TABS: 10.0000 mg | ORAL_TABLET | Freq: Every day | ORAL | 11 refills | Status: DC

## 2019-07-25 MED ORDER — NURTEC 75 MG PO TBDP: 1.0000 | ORAL_TABLET | Freq: Every day | ORAL | 0 refills | Status: DC | PRN

## 2019-07-25 NOTE — Progress Notes (Signed)
Subjective: CC: est care, migraines, depression PCP: Janora Norlander, DO JME:QASTMH Schwieger is a 44 y.o. female presenting to clinic today for:  1. Migraine headaches Patient reports longstanding history of migraine headaches.  The Aimovig tends to control her migraine headaches quite well but she does note some tension in the posterior aspect of her head, particularly as the end of the Aimovig cycle approaches.  She uses the Imitrex less than 1 time per month and the effectiveness of this medication varies.  Her migraine headaches are typically described as blurred vision, phonophobia and nausea.  She is never tried Nurtec but would be interested in this.  Her previous PCP gave her small quantities of Tylenol 3 to have on hand if needed for severe migraines that are uncontrolled.  She rarely uses this medicine but does need a refill.  Last refill was in 2020.  Denies any excessive daytime sedation with the medication.  2. Depressive disorder Well-controlled with Lexapro 20 mg daily.  She needs a refill on this medication.   ROS: Per HPI  Allergies  Allergen Reactions  . Galactose Anaphylaxis  . Other Anaphylaxis    Red meat, pork, lamb, deer meat  . Sulfasalazine Hives  . Erythromycin Hives  . Keflex [Cephalexin] Hives  . Nsaids     Has gastric ulcer  . Penicillins Hives  . Sulfa Antibiotics Hives  . Tolmetin     Has gastric ulcer   Past Medical History:  Diagnosis Date  . Anxiety   . Bone spur   . Broken ankle 03/2018  . Chronic kidney disease    h/o kidney stones  . Depression   . Fatty liver   . Gastric erosions   . Headache    migraines    Current Outpatient Medications:  .  acetaminophen-codeine (TYLENOL #3) 300-30 MG tablet, Take 1-2 tablets by mouth every 6 (six) hours as needed for moderate pain., Disp: 15 tablet, Rfl: 0 .  cetirizine (ZYRTEC) 10 MG tablet, Take 1 tablet (10 mg total) by mouth daily., Disp: 30 tablet, Rfl: 11 .  Erenumab-aooe (AIMOVIG)  70 MG/ML SOAJ, Inject 70 mg into the skin every 30 (thirty) days., Disp: 1 mL, Rfl: 11 .  escitalopram (LEXAPRO) 20 MG tablet, Take 1 tablet (20 mg total) by mouth daily., Disp: 90 tablet, Rfl: 3 .  fluticasone (FLONASE) 50 MCG/ACT nasal spray, Place 2 sprays into both nostrils daily., Disp: 16 g, Rfl: 6 .  hyoscyamine (LEVSIN SL) 0.125 MG SL tablet, Place 1 tablet (0.125 mg total) under the tongue every 6 (six) hours as needed., Disp: 30 tablet, Rfl: 1 .  omeprazole (PRILOSEC) 40 MG capsule, Take 1 capsule (40 mg total) by mouth 2 (two) times daily. Take 30-60 minutes before breakfast and dinner, Disp: 60 capsule, Rfl: 3 .  promethazine (PHENERGAN) 25 MG tablet, Take 0.5-1 tablets (12.5-25 mg total) by mouth every 8 (eight) hours as needed for nausea or vomiting., Disp: 15 tablet, Rfl: 0 .  SUMAtriptan (IMITREX) 100 MG tablet, Take 1 tablet (100 mg total) by mouth every 2 (two) hours as needed for migraine. May repeat in 2 hours if headache persists or recurs., Disp: 10 tablet, Rfl: 5 .  Vitamin D, Ergocalciferol, (DRISDOL) 1.25 MG (50000 UNIT) CAPS capsule, Take 1 capsule (50,000 Units total) by mouth every 7 (seven) days., Disp: 13 capsule, Rfl: 3 Social History   Socioeconomic History  . Marital status: Divorced    Spouse name: Not on file  . Number of  children: Not on file  . Years of education: Not on file  . Highest education level: Not on file  Occupational History  . Occupation: staff account  Tobacco Use  . Smoking status: Former Smoker    Quit date: 10/15/2011    Years since quitting: 7.7  . Smokeless tobacco: Never Used  Vaping Use  . Vaping Use: Never used  Substance and Sexual Activity  . Alcohol use: Yes    Comment: Very RARE  . Drug use: No  . Sexual activity: Never  Other Topics Concern  . Not on file  Social History Narrative  . Not on file   Social Determinants of Health   Financial Resource Strain:   . Difficulty of Paying Living Expenses:   Food Insecurity:    . Worried About Charity fundraiser in the Last Year:   . Arboriculturist in the Last Year:   Transportation Needs:   . Film/video editor (Medical):   Marland Kitchen Lack of Transportation (Non-Medical):   Physical Activity:   . Days of Exercise per Week:   . Minutes of Exercise per Session:   Stress:   . Feeling of Stress :   Social Connections:   . Frequency of Communication with Friends and Family:   . Frequency of Social Gatherings with Friends and Family:   . Attends Religious Services:   . Active Member of Clubs or Organizations:   . Attends Archivist Meetings:   Marland Kitchen Marital Status:   Intimate Partner Violence:   . Fear of Current or Ex-Partner:   . Emotionally Abused:   Marland Kitchen Physically Abused:   . Sexually Abused:    Family History  Problem Relation Age of Onset  . Hypertension Mother   . Depression Father   . Hypertension Father   . Depression Brother   . Learning disabilities Brother   . Cancer Maternal Aunt   . Diabetes Paternal Uncle   . Early death Maternal Grandfather   . Heart disease Maternal Grandfather   . Cancer Paternal Grandmother   . Depression Paternal Grandmother   . Diabetes Paternal Grandmother   . Heart disease Paternal Grandfather   . Colon cancer Neg Hx   . Esophageal cancer Neg Hx     Objective: Office vital signs reviewed. BP 116/76   Pulse 77   Temp 97.6 F (36.4 C)   Ht 5' 7"  (1.702 m)   Wt 198 lb 9.6 oz (90.1 kg)   SpO2 97%   BMI 31.11 kg/m   Physical Examination:  General: Awake, alert, well nourished, No acute distress HEENT: Normal, sclera white, MMM, PERRL, EOMI Cardio: regular rate and rhythm, S1S2 heard, no murmurs appreciated Pulm: clear to auscultation bilaterally, no wheezes, rhonchi or rales; normal work of breathing on room air Extremities: warm, well perfused, No edema, cyanosis or clubbing; +2 pulses bilaterally MSK: normal gait and station Neuro: no focal deficits Psych: Mood stable, speech normal, affect  appropriate, pleasant and interactive Depression screen [facility] 2/9 07/25/2019 07/25/2019 04/27/2019  Decreased Interest 1 0 0  Down, Depressed, Hopeless 0 0 0  PHQ - 2 Score 1 0 0  Altered sleeping 2 - 0  Tired, decreased energy 2 - 0  Change in appetite 0 - 0  Feeling bad or failure about yourself  0 - 0  Trouble concentrating 1 - 0  Moving slowly or fidgety/restless 0 - 0  Suicidal thoughts 0 - 0  PHQ-9 Score 6 - 0  Difficult  doing work/chores Somewhat difficult - -  Some recent data might be hidden       Assessment/ Plan: 44 y.o. female   1. Chronic migraine without aura without status migrainosus, not intractable Stable with Aimovig.  I have given her a trial of Nurtec.  She will let me know if this is effective and I will put a coupon code upfront. - ToxASSURE Select 13 (MW), Urine  2. Depression, recurrent ([place]) Stable.  Lexapro renewed - escitalopram (LEXAPRO) 20 MG tablet; Take 1 tablet (20 mg total) by mouth daily.  Dispense: 90 tablet; Refill: 3  3. Establishing care with new doctor, encounter for I reviewed her chart  4. Vitamin D insufficiency Noted to be insufficient last visit.  Complete high-dose vitamin D and then should be on 400 international units of vitamin D daily  5. History of fracture of right ankle - acetaminophen-codeine (TYLENOL #3) 300-30 MG tablet; Take 1 tablet by mouth every 6 (six) hours as needed for severe pain.  Dispense: 20 tablet; Refill: 0  6. History of fracture of left ankle - acetaminophen-codeine (TYLENOL #3) 300-30 MG tablet; Take 1 tablet by mouth every 6 (six) hours as needed for severe pain.  Dispense: 20 tablet; Refill: 0  7. Controlled substance agreement signed UDS and CSC completed as per office policy today.  Patient may follow-up in 6 months, sooner if needed - ToxASSURE Select 13 (MW), Urine   Orders Placed This Encounter  Procedures  . ToxASSURE Select 13 (MW), Urine   Meds ordered this encounter  Medications  .  acetaminophen-codeine (TYLENOL #3) 300-30 MG tablet    Sig: Take 1 tablet by mouth every 6 (six) hours as needed for severe pain.    Dispense:  20 tablet    Refill:  0  . escitalopram (LEXAPRO) 20 MG tablet    Sig: Take 1 tablet (20 mg total) by mouth daily.    Dispense:  90 tablet    Refill:  3  . Rimegepant Sulfate (NURTEC) 75 MG TBDP    Sig: Take 1 tablet by mouth daily as needed (migraine headache).    Dispense:  4 tablet    Refill:  0  . cetirizine (ZYRTEC) 10 MG tablet    Sig: Take 1 tablet (10 mg total) by mouth daily.    Dispense:  30 tablet    Refill:  11   The Narcotic Database has been reviewed.  There were no red flags.     Janora Norlander, DO [place] [phone]

## 2019-07-27 LAB — TOXASSURE SELECT 13 (MW), URINE

## 2019-10-16 ENCOUNTER — Ambulatory Visit (INDEPENDENT_AMBULATORY_CARE_PROVIDER_SITE_OTHER): Payer: BC Managed Care – PPO | Admitting: Family Medicine

## 2019-10-16 DIAGNOSIS — H60331 Swimmer's ear, right ear: Secondary | ICD-10-CM

## 2019-10-16 MED ORDER — FLUTICASONE PROPIONATE 50 MCG/ACT NA SUSP: 2.0000 | Freq: Every day | NASAL | 6 refills | Status: DC

## 2019-10-16 MED ORDER — NEOMYCIN-POLYMYXIN-HC 1 % OT SOLN: 3.0000 [drp] | Freq: Four times a day (QID) | OTIC | 0 refills | Status: AC

## 2019-10-16 NOTE — Progress Notes (Signed)
Telephone visit  Subjective: CC: ear infection  PCP: Courtney Joseph, Courtney Joseph INO:[name] is a 44 y.o. female calls for telephone consult today. Patient provides verbal consent for consult held via phone.  Due to COVID-19 pandemic this visit was conducted virtually. This visit type was conducted due to national recommendations for restrictions regarding the COVID-19 Pandemic (e.g. social distancing, sheltering in place) in an effort to limit this patient's exposure and mitigate transmission in our community. All issues noted in this document were discussed and addressed.  A physical exam was not performed with this format.   Location of patient: work Retail buyer of provider: [employer] Others present for call: none  1. Ear infection Patient reports that the inside of her left ear canal feels swollen.  She has been experiencing soreness and heat.  She had a left over rx of doxycycline and has been using it for the last several days.  She has been using tylenol.  No recent swimming.   ROS: Per HPI  Allergies  Allergen Reactions  . Galactose Anaphylaxis  . Other Anaphylaxis    Red meat, pork, lamb, deer meat  . Sulfasalazine Hives  . Erythromycin Hives  . Keflex [Cephalexin] Hives  . Nsaids     Has gastric ulcer  . Penicillins Hives  . Sulfa Antibiotics Hives  . Tolmetin     Has gastric ulcer   Past Medical History:  Diagnosis Date  . Anxiety   . Bone spur   . Broken ankle 03/2018  . Chronic kidney disease    h/o kidney stones  . Depression   . Fatty liver   . Gastric erosions   . Headache    migraines    Current Outpatient Medications:  .  acetaminophen-codeine (TYLENOL #3) 300-30 MG tablet, Take 1 tablet by mouth every 6 (six) hours as needed for severe pain., Disp: 20 tablet, Rfl: 0 .  cetirizine (ZYRTEC) 10 MG tablet, Take 1 tablet (10 mg total) by mouth daily., Disp: 30 tablet, Rfl: 11 .  Erenumab-aooe (AIMOVIG) 70 MG/ML SOAJ, Inject 70 mg into the skin every  30 (thirty) days., Disp: 1 mL, Rfl: 11 .  escitalopram (LEXAPRO) 20 MG tablet, Take 1 tablet (20 mg total) by mouth daily., Disp: 90 tablet, Rfl: 3 .  fluticasone (FLONASE) 50 MCG/ACT nasal spray, Place 2 sprays into both nostrils daily., Disp: 16 g, Rfl: 6 .  hyoscyamine (LEVSIN SL) 0.125 MG SL tablet, Place 1 tablet (0.125 mg total) under the tongue every 6 (six) hours as needed., Disp: 30 tablet, Rfl: 1 .  Rimegepant Sulfate (NURTEC) 75 MG TBDP, Take 1 tablet by mouth daily as needed (migraine headache)., Disp: 4 tablet, Rfl: 0 .  SUMAtriptan (IMITREX) 100 MG tablet, Take 1 tablet (100 mg total) by mouth every 2 (two) hours as needed for migraine. May repeat in 2 hours if headache persists or recurs., Disp: 10 tablet, Rfl: 5 .  Vitamin D, Ergocalciferol, (DRISDOL) 1.25 MG (50000 UNIT) CAPS capsule, Take 1 capsule (50,000 Units total) by mouth every 7 (seven) days., Disp: 13 capsule, Rfl: 3  Assessment/ Plan: 44 y.o. female   Acute swimmer's ear of right side - Plan: Courtney Joseph  Empiric treatment with Courtney Joseph.  We discussed home care instructions.  Okay to continue Tylenol.  Reasons to return discussed.  Follow-up as needed  Start time: 12:24pm (LVM). 12:47pm End time: 12:51pm  Total time spent on patient care (including telephone call/ virtual visit): 9  minutes  Courtney Joseph, Courtney Joseph [place] [phone]

## 2019-10-17 ENCOUNTER — Telehealth: Payer: Self-pay | Admitting: Family Medicine

## 2019-10-17 NOTE — Telephone Encounter (Signed)
Approved.  

## 2019-10-17 NOTE — Telephone Encounter (Signed)
In coming PA for Aimovig sent to plan. Waiting for Please wait for [employer] NCPDP 2017 to return a determination.

## 2019-11-23 ENCOUNTER — Other Ambulatory Visit: Payer: Self-pay | Admitting: *Deleted

## 2019-11-23 DIAGNOSIS — J301 Allergic rhinitis due to pollen: Secondary | ICD-10-CM

## 2019-11-23 MED ORDER — CETIRIZINE HCL 10 MG PO TABS: 10.0000 mg | ORAL_TABLET | Freq: Every day | ORAL | 3 refills | Status: DC

## 2019-12-15 ENCOUNTER — Other Ambulatory Visit: Payer: Self-pay | Admitting: Family Medicine

## 2019-12-15 DIAGNOSIS — Z8781 Personal history of (healed) traumatic fracture: Secondary | ICD-10-CM

## 2019-12-21 ENCOUNTER — Other Ambulatory Visit: Payer: Self-pay | Admitting: Family Medicine

## 2019-12-21 DIAGNOSIS — Z8781 Personal history of (healed) traumatic fracture: Secondary | ICD-10-CM

## 2019-12-24 NOTE — Telephone Encounter (Signed)
Ok to schedule VIDEO visit for refill.

## 2020-01-14 ENCOUNTER — Telehealth: Payer: BC Managed Care – PPO | Admitting: Family Medicine

## 2020-01-25 ENCOUNTER — Ambulatory Visit: Payer: BC Managed Care – PPO | Admitting: Family Medicine

## 2020-02-04 ENCOUNTER — Other Ambulatory Visit: Payer: Self-pay

## 2020-02-04 ENCOUNTER — Ambulatory Visit (INDEPENDENT_AMBULATORY_CARE_PROVIDER_SITE_OTHER): Payer: BC Managed Care – PPO | Admitting: Family Medicine

## 2020-02-04 DIAGNOSIS — G43709 Chronic migraine without aura, not intractable, without status migrainosus: Secondary | ICD-10-CM | POA: Diagnosis not present

## 2020-02-04 DIAGNOSIS — Z8781 Personal history of (healed) traumatic fracture: Secondary | ICD-10-CM

## 2020-02-04 MED ORDER — ACETAMINOPHEN-CODEINE #3 300-30 MG PO TABS: 1.0000 | ORAL_TABLET | Freq: Four times a day (QID) | ORAL | 0 refills | Status: DC | PRN

## 2020-02-04 NOTE — Progress Notes (Signed)
Telephone visit  Subjective: CC: Follow-up migraine headaches PCP: Janora Norlander, DO JGG:EZMOQH Kantor is a 45 y.o. female calls for telephone consult today. Patient provides verbal consent for consult held via phone.  Due to COVID-19 pandemic this visit was conducted virtually. This visit type was conducted due to national recommendations for restrictions regarding the COVID-19 Pandemic (e.g. social distancing, sheltering in place) in an effort to limit this patient's exposure and mitigate transmission in our community. All issues noted in this document were discussed and addressed.  A physical exam was not performed with this format.   Location of patient: [place] Location of provider: [facility] Others present for call: none  1.  Migraine headaches Last dose Aimovig 01/29/20, not due for 3 weeks.  Her insurance no longer will cover Aimovig and is asking for Emgality or Ajovy instead.  She ran out of Tylenol #3.  She has had 3-4 migraine headaches in the last month.  She has only used Imitrex a few times.  Nurtec was ineffective.    2. history of fracture of bilateral ankles Patient with bilateral ankle fracture history.  She uses the Tylenol 3 for this primarily.  She ran out as above.  She has an appointment this week to discuss her ankles as they have been more painful than normal.  She uses the Tylenol 3 very sparingly when OTC medications are ineffective.  Denies any excessive sedation or falls with this medicine.  ROS: Per HPI  Allergies  Allergen Reactions  . Galactose Anaphylaxis  . Other Anaphylaxis    Red meat, pork, lamb, deer meat  . Sulfasalazine Hives  . Erythromycin Hives  . Keflex [Cephalexin] Hives  . Nsaids     Has gastric ulcer  . Penicillins Hives  . Sulfa Antibiotics Hives  . Tolmetin     Has gastric ulcer   Past Medical History:  Diagnosis Date  . Anxiety   . Bone spur   . Broken ankle 03/2018  . Chronic kidney disease    h/o kidney stones  . Depression    . Fatty liver   . Gastric erosions   . Headache    migraines    Current Outpatient Medications:  .  acetaminophen-codeine (TYLENOL #3) 300-30 MG tablet, Take 1 tablet by mouth every 6 (six) hours as needed for severe pain., Disp: 20 tablet, Rfl: 0 .  cetirizine (ZYRTEC) 10 MG tablet, Take 1 tablet (10 mg total) by mouth daily., Disp: 90 tablet, Rfl: 3 .  clindamycin (CLEOCIN T) 1 % lotion, APPLY ON THE SKIN DAILY, Disp: , Rfl:  .  Erenumab-aooe (AIMOVIG) 70 MG/ML SOAJ, Inject 70 mg into the skin every 30 (thirty) days., Disp: 1 mL, Rfl: 11 .  escitalopram (LEXAPRO) 20 MG tablet, Take 1 tablet (20 mg total) by mouth daily., Disp: 90 tablet, Rfl: 3 .  fluticasone (FLONASE) 50 MCG/ACT nasal spray, Place 2 sprays into both nostrils daily., Disp: 16 g, Rfl: 6 .  hyoscyamine (LEVSIN SL) 0.125 MG SL tablet, Place 1 tablet (0.125 mg total) under the tongue every 6 (six) hours as needed., Disp: 30 tablet, Rfl: 1 .  Rimegepant Sulfate (NURTEC) 75 MG TBDP, Take 1 tablet by mouth daily as needed (migraine headache)., Disp: 4 tablet, Rfl: 0 .  SUMAtriptan (IMITREX) 100 MG tablet, Take 1 tablet (100 mg total) by mouth every 2 (two) hours as needed for migraine. May repeat in 2 hours if headache persists or recurs., Disp: 10 tablet, Rfl: 5 .  Vitamin D,  Ergocalciferol, (DRISDOL) 1.25 MG (50000 UNIT) CAPS capsule, Take 1 capsule (50,000 Units total) by mouth every 7 (seven) days., Disp: 13 capsule, Rfl: 3  Assessment/ Plan: 45 y.o. female   Chronic migraine without aura without status migrainosus, not intractable  History of fracture of right ankle - Plan: acetaminophen-codeine (TYLENOL #3) 300-30 MG tablet  History of fracture of left ankle - Plan: acetaminophen-codeine (TYLENOL #3) 300-30 MG tablet  Migraines are currently stable but will require transition from Aimovig to an alternative.  I plan for Emgality given ease of use of that medicine.  I attempted to contact the [employer] but  they were "closed for the holidays".  I will try again tomorrow and also try and coordinate with our clinical pharmacist with regards to transition of this medicine.  We will need to determine whether or not she needs a washout of her previous and/or will require reloading with the new medication.  Nurtec was totally ineffective and therefore this has been discontinued from her list.  She will continue Imitrex as needed breakthrough migraines.  Her Tylenol 3 was renewed given unique circumstances of office being closed due to weather.  She is up-to-date on her controlled substance contract and UDS.  She understands that this will be a one-time only renewal without face-to-face eval.  The national narcotic database was reviewed and there were no red flags.  She will follow-up for full checkup in the next 3 to 6 months  Start time: 10:52am End time: 11:03am  Total time spent on patient care (including telephone call/ virtual visit): 11 minutes  [place], [place] [phone]

## 2020-02-05 ENCOUNTER — Other Ambulatory Visit: Payer: Self-pay | Admitting: Family Medicine

## 2020-02-05 DIAGNOSIS — G43709 Chronic migraine without aura, not intractable, without status migrainosus: Secondary | ICD-10-CM

## 2020-02-05 MED ORDER — EMGALITY 120 MG/ML ~~LOC~~ SOSY: PREFILLED_SYRINGE | SUBCUTANEOUS | 99 refills | Status: DC

## 2020-02-05 NOTE — Progress Notes (Signed)
Spoke to pt on phone. 264m Month 1. Due in February for injection 1213mMonthly thereafter. Recommend checking website for coupon

## 2020-02-06 ENCOUNTER — Encounter: Payer: BC Managed Care – PPO | Admitting: Adult Health

## 2020-02-06 ENCOUNTER — Telehealth: Payer: Self-pay | Admitting: *Deleted

## 2020-02-06 NOTE — Telephone Encounter (Signed)
PA in process   Key: [id number] - PA Case ID: [id number] - Rx #: [id number] Need help? Call us at [phone] Status Sent to [facility] Emgality 120MG/ML syringes (migraine) Form [employer] Electronic PA Form [phone] NCPDP)

## 2020-02-07 NOTE — Telephone Encounter (Signed)
As long as you remain covered by the [facility] and there are no changes to your plan benefits, this request is approved for the following time period: 02/06/2020 - 05/06/2020   Pharmacy aware

## 2020-02-13 ENCOUNTER — Other Ambulatory Visit: Payer: Self-pay

## 2020-02-13 ENCOUNTER — Encounter: Payer: Self-pay | Admitting: Adult Health

## 2020-02-13 ENCOUNTER — Ambulatory Visit: Payer: BC Managed Care – PPO | Admitting: Adult Health

## 2020-02-13 VITALS — BP 126/77 | HR 63 | Ht 67.0 in | Wt 206.0 lb

## 2020-02-13 DIAGNOSIS — Z780 Asymptomatic menopausal state: Secondary | ICD-10-CM | POA: Diagnosis not present

## 2020-02-13 DIAGNOSIS — R4589 Other symptoms and signs involving emotional state: Secondary | ICD-10-CM | POA: Diagnosis not present

## 2020-02-13 DIAGNOSIS — R232 Flushing: Secondary | ICD-10-CM | POA: Diagnosis not present

## 2020-02-13 DIAGNOSIS — Z8742 Personal history of other diseases of the female genital tract: Secondary | ICD-10-CM

## 2020-02-13 DIAGNOSIS — R61 Generalized hyperhidrosis: Secondary | ICD-10-CM

## 2020-02-13 DIAGNOSIS — G479 Sleep disorder, unspecified: Secondary | ICD-10-CM

## 2020-02-13 MED ORDER — ESTRADIOL 1 MG PO TABS
1.0000 mg | ORAL_TABLET | Freq: Every day | ORAL | 3 refills | Status: DC
Start: 2020-02-13 — End: 2020-04-02

## 2020-02-13 MED ORDER — PROGESTERONE 200 MG PO CAPS: ORAL_CAPSULE | ORAL | 3 refills | Status: DC

## 2020-02-13 NOTE — Patient Instructions (Signed)
Menopause and Hormone Replacement Therapy Menopause is a normal time of life when menstrual periods stop completely and the ovaries stop producing the female hormones estrogen and progesterone. Low levels of these hormones can affect your health and cause symptoms. Hormone replacement therapy (HRT) can relieve some of those symptoms. HRT is the use of artificial (synthetic) hormones to replace hormones that your body has stopped producing because you have reached menopause. Types of HRT HRT may consist of the synthetic hormones estrogen and progestin, or it may consist of estrogen-only therapy. You and your health care provider will decide which form of HRT is best for you. If you choose to be on HRT and you have a uterus, estrogen and progestin are usually prescribed. Estrogen-only therapy is used for women who do not have a uterus. Possible options for taking HRT include:  Pills.  Patches.  Gels.  Sprays.  Vaginal cream.  Vaginal rings.  Vaginal inserts. The amount of hormones that you take and how long you take them varies according to your health. It is important to:  Begin HRT with the lowest possible dosage.  Stop HRT as soon as your health care provider tells you to stop.  Work with your health care provider so that you feel informed and comfortable with your decisions.   Tell a health care provider about:  Any allergies you have.  Whether you have had blood clots or know of any risk factors you may have for blood clots.  Whether you or family members have had cancer, especially cancer of the breasts, ovaries, or uterus.  Any surgeries you have had.  All medicines you are taking, including vitamins, herbs, eye drops, creams, and over-the-counter medicines.  Whether you are pregnant or may be pregnant.  Any medical conditions you have. What are the benefits? HRT can reduce the frequency and severity of menopausal symptoms. Benefits of HRT vary according to the kind  of symptoms that you have, how severe they are, and your overall health. HRT may help to improve the following symptoms of menopause:  Hot flashes and night sweats. These are sudden feelings of heat that spread over the face and body. The skin may turn red, like a blush. Night sweats are hot flashes that happen while you are sleeping or trying to sleep.  Bone loss (osteoporosis). The body loses calcium more quickly after menopause, causing the bones to become weaker. This can increase the risk for bone breaks (fractures).  Vaginal dryness. The lining of the vagina can become thin and dry, which can cause pain during sex or cause infection, burning, or itching.  Urinary tract infections.  Urinary incontinence. This is the inability to control when you urinate.  Irritability.  Short-term memory problems. What are the risks? Risks of HRT vary depending on your individual health and medical history. Risks of HRT also depend on whether you receive both estrogen and progestin or you receive estrogen only. HRT may increase the risk of:  Spotting. This is when a small amount of blood leaks from the vagina unexpectedly.  Endometrial cancer. This cancer is in the lining of the uterus (endometrium).  Breast cancer.  Increased density of breast tissue. This can make it harder to find breast cancer on a breast X-ray (mammogram).  Stroke.  Heart disease.  Blood clots.  Gallbladder disease or liver disease. Risks of HRT can increase if you have any of the following conditions:  Endometrial cancer.  Liver disease.  Heart disease.  Breast cancer.  History of blood clots.  History of stroke. Follow these instructions at home: Pap tests  Have Pap tests done as often as told by your health care provider. A Pap test is sometimes called a Pap smear. It is a screening test that is used to check for signs of cancer of the cervix and vagina. A Pap test can also identify the presence of  infection or precancerous changes. Pap tests may be done: ? Every 3 years, starting at age 72. ? Every 5 years, starting after age 32, in combination with testing for human papillomavirus (HPV). ? More often or less often depending on other medical conditions you have, your age, and other risk factors.  It is up to you to get the results of your Pap test. Ask your health care provider, or the department that is doing the test, when your results will be ready. General instructions  Take over-the-counter and prescription medicines only as told by your health care provider.  Do not use any products that contain nicotine or tobacco. These products include cigarettes, chewing tobacco, and vaping devices, such as e-cigarettes. If you need help quitting, ask your health care provider.  Get mammograms, pelvic exams, and medical checkups as often as told by your health care provider.  Keep all follow-up visits. This is important. Contact a health care provider if you have:  Pain or swelling in your legs.  Lumps or changes in your breasts or armpits.  Pain, burning, or bleeding when you urinate.  Unusual vaginal bleeding.  Dizziness or headaches.  Pain in your abdomen. Get help right away if you have:  Shortness of breath.  Chest pain.  Slurred speech.  Weakness or numbness in any part of your arms or legs. These symptoms may represent a serious problem that is an emergency. Do not wait to see if the symptoms will go away. Get medical help right away. Call your local emergency services (911 in the [place] Do not drive yourself to the hospital. Summary  Menopause is a normal time of life when menstrual periods stop completely and the ovaries stop producing the female hormones estrogen and progesterone.  HRT can reduce the frequency and severity of menopausal symptoms.  Risks of HRT vary depending on your individual health and medical history. This information is not intended to  replace advice given to you by your health care provider. Make sure you discuss any questions you have with your health care provider. Document Revised: 07/09/2019 Document Reviewed: 07/09/2019 Elsevier Patient Education  [place]. https://www.womenshealth.gov/menopause/menopause-basics"> https://www.clinicalkey.com">  Menopause Menopause is the normal time of a woman's life when menstrual periods stop completely. It marks the natural end to a woman's ability to become pregnant. It can be defined as the absence of a menstrual period for 12 months without another medical cause. The transition to menopause (perimenopause) most often happens between the ages of 34 and 35, and can last for many years. During perimenopause, hormone levels change in your body, which can cause symptoms and affect your health. Menopause may increase your risk for:  Weakened bones (osteoporosis), which causes fractures.  Depression.  Hardening and narrowing of the arteries (atherosclerosis), which can cause heart attacks and strokes. What are the causes? This condition is usually caused by a natural change in hormone levels that happens as you get older. The condition may also be caused by changes that are not natural, including:  Surgery to remove both ovaries (surgical menopause).  Side effects from some medicines, such  as chemotherapy used to treat cancer (chemical menopause). What increases the risk? This condition is more likely to start at an earlier age if you have certain medical conditions or have undergone treatments, including:  A tumor of the pituitary gland in the brain.  A disease that affects the ovaries and hormones.  Certain cancer treatments, such as chemotherapy or hormone therapy, or radiation therapy on the pelvis.  Heavy smoking and excessive alcohol use.  Family history of early menopause. This condition is also more likely to develop earlier in women who are very thin. What are  the signs or symptoms? Symptoms of this condition include:  Hot flashes.  Irregular menstrual periods.  Night sweats.  Changes in feelings about sex. This could be a decrease in sex drive or an increased discomfort around your sexuality.  Vaginal dryness and thinning of the vaginal walls. This may cause painful sex.  Dryness of the skin and development of wrinkles.  Headaches.  Problems sleeping (insomnia).  Mood swings or irritability.  Memory problems.  Weight gain.  Hair growth on the face and chest.  Bladder infections or problems with urinating. How is this diagnosed? This condition is diagnosed based on your medical history, a physical exam, your age, your menstrual history, and your symptoms. Hormone tests may also be done. How is this treated? In some cases, no treatment is needed. You and your health care provider should make a decision together about whether treatment is necessary. Treatment will be based on your individual condition and preferences. Treatment for this condition focuses on managing symptoms. Treatment may include:  Menopausal hormone therapy (MHT).  Medicines to treat specific symptoms or complications.  Acupuncture.  Vitamin or herbal supplements. Before starting treatment, make sure to let your health care provider know if you have a personal or family history of these conditions:  Heart disease.  Breast cancer.  Blood clots.  Diabetes.  Osteoporosis. Follow these instructions at home: Lifestyle  Do not use any products that contain nicotine or tobacco, such as cigarettes, e-cigarettes, and chewing tobacco. If you need help quitting, ask your health care provider.  Get at least 30 minutes of physical activity on 5 or more days each week.  Avoid alcoholic and caffeinated beverages, as well as spicy foods. This may help prevent hot flashes.  Get 7-8 hours of sleep each night.  If you have hot flashes, try: ? Dressing in  layers. ? Avoiding things that may trigger hot flashes, such as spicy food, warm places, or stress. ? Taking slow, deep breaths when a hot flash starts. ? Keeping a fan in your home and office.  Find ways to manage stress, such as deep breathing, meditation, or journaling.  Consider going to group therapy with other women who are having menopause symptoms. Ask your health care provider about recommended group therapy meetings. Eating and drinking  Eat a healthy, balanced diet that contains whole grains, lean protein, low-fat dairy, and plenty of fruits and vegetables.  Your health care provider may recommend adding more soy to your diet. Foods that contain soy include tofu, tempeh, and soy milk.  Eat plenty of foods that contain calcium and vitamin D for bone health. Items that are rich in calcium include low-fat milk, yogurt, beans, almonds, sardines, broccoli, and kale.   Medicines  Take over-the-counter and prescription medicines only as told by your health care provider.  Talk with your health care provider before starting any herbal supplements. If prescribed, take vitamins and supplements as  told by your health care provider. General instructions  Keep track of your menstrual periods, including: ? When they occur. ? How heavy they are and how long they last. ? How much time passes between periods.  Keep track of your symptoms, noting when they start, how often you have them, and how long they last.  Use vaginal lubricants or moisturizers to help with vaginal dryness and improve comfort during sex.  Keep all follow-up visits. This is important. This includes any group therapy or counseling.   Contact a health care provider if:  You are still having menstrual periods after age 43.  You have pain during sex.  You have not had a period for 12 months and you develop vaginal bleeding. Get help right away if you have:  Severe depression.  Excessive vaginal bleeding.  Pain  when you urinate.  A fast or irregular heartbeat (palpitations).  Severe headaches.  Abdominal pain or severe indigestion. Summary  Menopause is a normal time of life when menstrual periods stop completely. It is usually defined as the absence of a menstrual period for 12 months without another medical cause.  The transition to menopause (perimenopause) most often happens between the ages of 69 and 66 and can last for several years.  Symptoms can be managed through medicines, lifestyle changes, and complementary therapies such as acupuncture.  Eat a balanced diet that is rich in nutrients to promote bone health and heart health and to manage symptoms during menopause. This information is not intended to replace advice given to you by your health care provider. Make sure you discuss any questions you have with your health care provider. Document Revised: 10/05/2019 Document Reviewed: 06/21/2019 Elsevier Patient Education  [place].

## 2020-02-13 NOTE — Progress Notes (Signed)
  Subjective:     Patient ID: Courtney Joseph, female   DOB: [birth year], 45 y.o.   MRN: [id number]  HPI Courtney Joseph is a 45 year old white female, divorced, G1P1 in complaining of hot flashes, night sweats and moody, irritable and not sleeping well. 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 She has brace left knee. She had [facility] 02/08/19 47.6.  She tried black cohosh and estroven.  She said she was just not happy with the [facility] as they did not get back with her regarding her results.  PCP is Dr Lajuana Ripple.  Review of Systems +hot flashes +night sweats +sleep disturbance +moody and irritable Reviewed past medical,surgical, social and family history. Reviewed medications and allergies.     Objective:   Physical Exam BP 126/77 (BP Location: Left Arm, Patient Position: Sitting, Cuff Size: Large)   Pulse 63   Ht 5' 7"  (1.702 m)   Wt 206 lb (93.4 kg)   BMI 32.26 kg/m   Skin warm and dry. Lungs: clear to ausculation bilaterally. Cardiovascular: regular rate and rhythm.  Upstream - 02/13/20 1553      Pregnancy Intention Screening   Does the patient want to become pregnant in the next year? No    Does the patient's partner want to become pregnant in the next year? No    Would the patient like to discuss contraceptive options today? No      Contraception Wrap Up   Current Method No Method - Other Reason    End Method No Method - Other Reason    Contraception Counseling Provided No         AA is 1 Fall risk is low PHQ 9 score is 9, no SI on lexapro GAD 7 score is 8.   Reviewed records with her.  Assessment:     1. Hot flashes  2. Night sweats  3. Moody  4. Sleep disturbance When wakes up, move to another spot and try to go back to sleep   5. Menopause Discussed menopause and HRT  She is aware of risks and benefits and wants to try it  Will rx Prometrium and estrace  Meds ordered this encounter  Medications  .  progesterone (PROMETRIUM) 200 MG capsule    Sig: Take 1 at bedtime    Dispense:  30 capsule    Refill:  3    Order Specific Question:   Supervising Provider    Answer:   Elonda Husky, LUTHER H [[id number]]  . estradiol (ESTRACE) 1 MG tablet    Sig: Take 1 tablet (1 mg total) by mouth daily.    Dispense:  30 tablet    Refill:  3    Order Specific Question:   Supervising Provider    Answer:   Tania Ade H [[id number]]   Stop black cohosh and estroven Will follow up in 7 weeks   6. History of abnormal cervical Pap smear Will repeat in 6-12 months     Plan:     Review handouts on menopause and menopause and HRT

## 2020-03-21 IMAGING — DX DG ANKLE COMPLETE 3+V*R*
2 series · 2 of 2 positions shown · non-contrast
Comparison: None.

CLINICAL DATA: Right foot and lower leg pain after injury. Stepped
in a hole while walking dog rolling ankle. Ankle deformity.

EXAM:
RIGHT ANKLE - COMPLETE 3+ VIEW

[ankle obl]
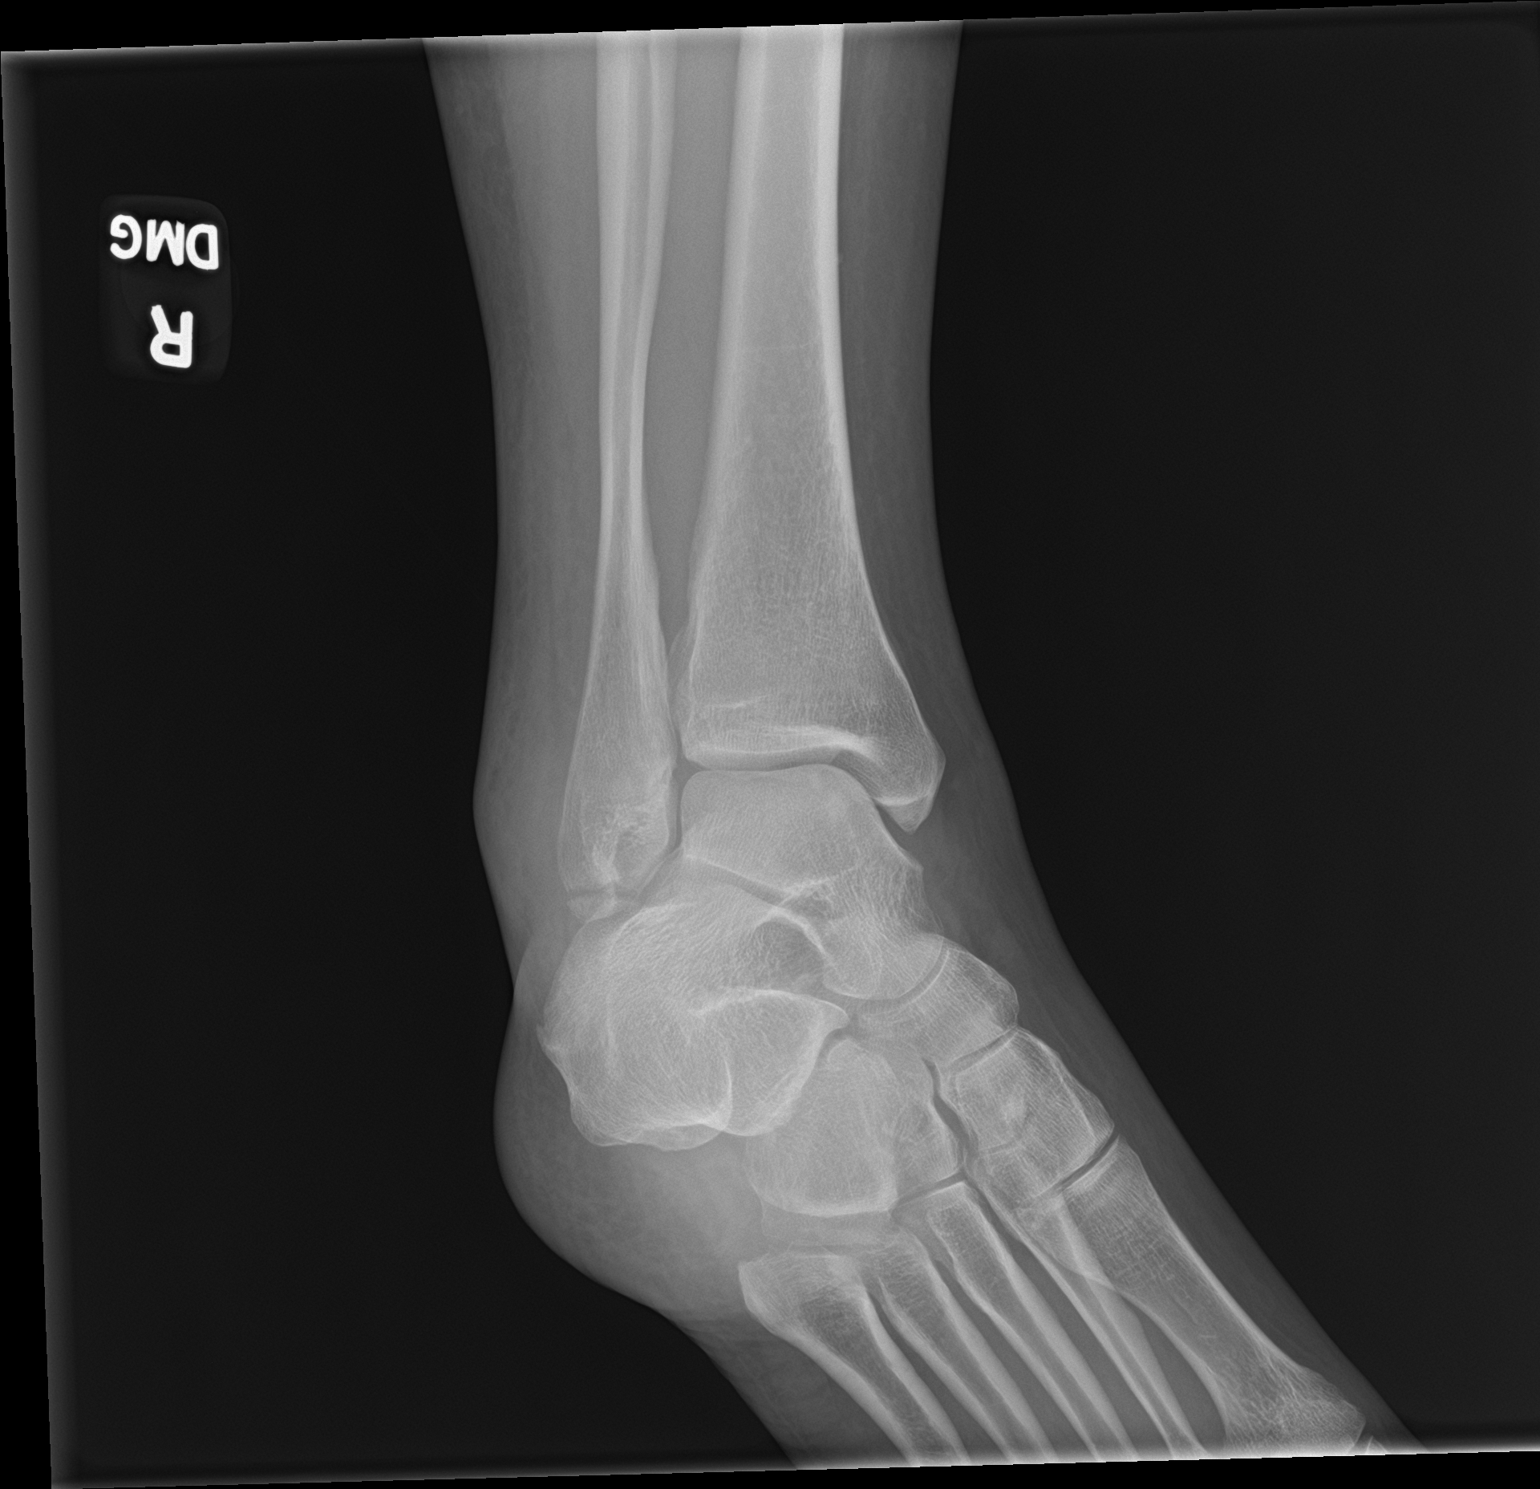

[ankle lat]
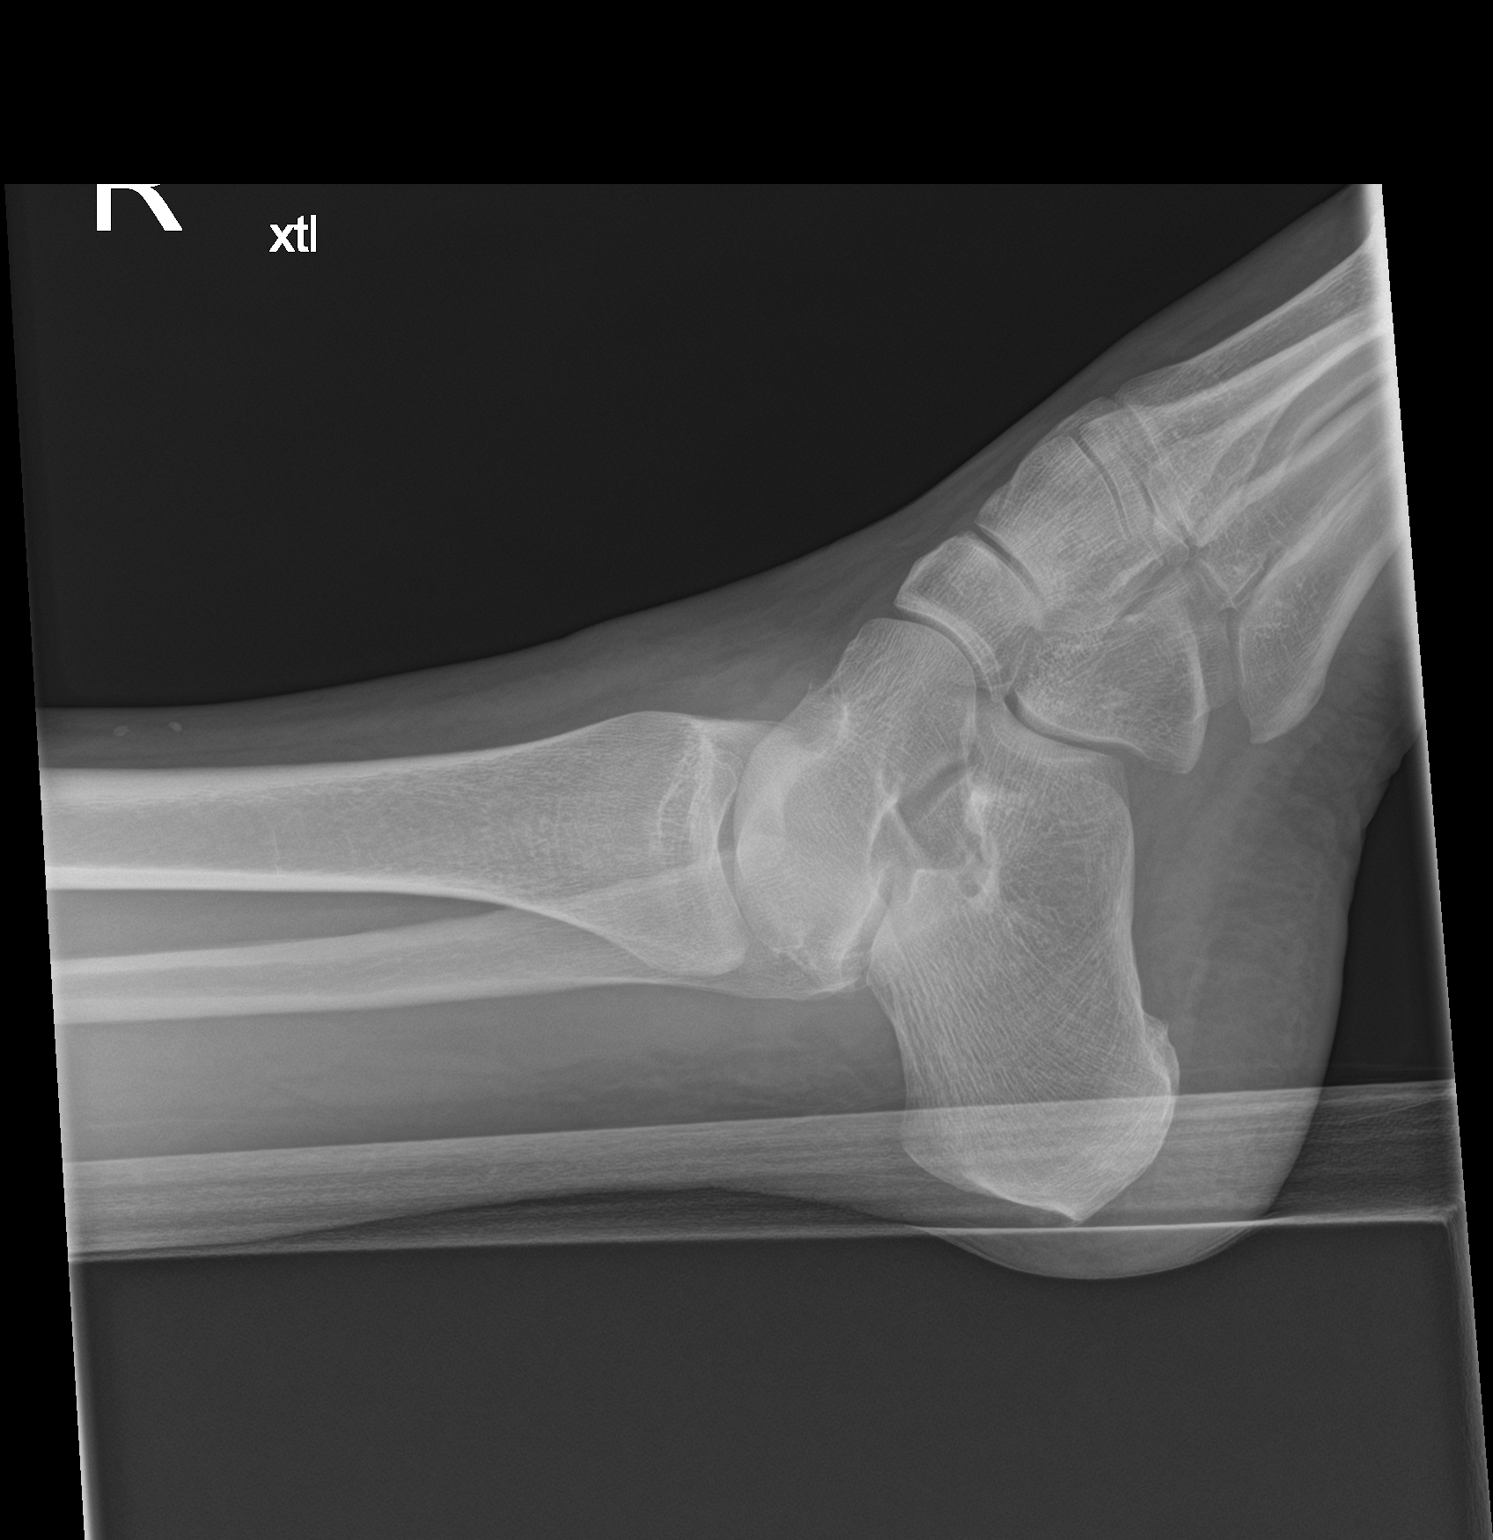

[2 of 2 positions shown; findings below may reference images not displayed]

FINDINGS: Mildly displaced distal fibular fracture distal to the ankle
mortise. No additional acute fracture of the ankle. The ankle
mortise is preserved. Significant soft tissue edema laterally.
Possible small ankle joint effusion.
IMPRESSION: Mildly displaced distal fibular fracture distal to the ankle mortise
with associated soft tissue edema.

## 2020-04-02 ENCOUNTER — Encounter: Payer: Self-pay | Admitting: Adult Health

## 2020-04-02 ENCOUNTER — Other Ambulatory Visit: Payer: Self-pay

## 2020-04-02 ENCOUNTER — Ambulatory Visit: Payer: BC Managed Care – PPO | Admitting: Adult Health

## 2020-04-02 VITALS — BP 116/73 | HR 68 | Ht 67.0 in | Wt 207.5 lb

## 2020-04-02 DIAGNOSIS — Z79890 Hormone replacement therapy (postmenopausal): Secondary | ICD-10-CM | POA: Insufficient documentation

## 2020-04-02 DIAGNOSIS — R4589 Other symptoms and signs involving emotional state: Secondary | ICD-10-CM

## 2020-04-02 DIAGNOSIS — R61 Generalized hyperhidrosis: Secondary | ICD-10-CM

## 2020-04-02 MED ORDER — ESTRADIOL 2 MG PO TABS
2.0000 mg | ORAL_TABLET | Freq: Every day | ORAL | 6 refills | Status: DC
Start: 2020-04-02 — End: 2020-09-16

## 2020-04-02 MED ORDER — PROGESTERONE 200 MG PO CAPS: ORAL_CAPSULE | ORAL | 6 refills | Status: DC

## 2020-04-02 NOTE — Progress Notes (Signed)
  Subjective:     Patient ID: Courtney Joseph, female   DOB: November 29, 1975, 45 y.o.   MRN: [id number]  HPI Shadavia is a 45 year old white female,married, G1P1 back in follow up on starting estrace 1 mg and Prometrium 200 mg back in January for hot flashes,night sweats, moody and sleep disturbance and was better till about 2 weeks and seems more irritable and and night sweats again. PCP is Dr Lajuana Ripple.  Review of Systems More irritable in last 2 weeks  +night sweats Reviewed past medical,surgical, social and family history. Reviewed medications and allergies.      Objective:   Physical Exam   BP 116/73 (BP Location: Left Arm, Patient Position: Sitting, Cuff Size: Large)   Pulse 68   Ht 5' 7"  (1.702 m)   Wt 207 lb 8 oz (94.1 kg)   BMI 32.50 kg/m    Skin warm and dry.  Lungs: clear to ausculation bilaterally. Cardiovascular: regular rate and rhythm. Fall risk is moderate  Up[facility] - 04/02/20 1029      Pregnancy Intention Screening   Does the patient want to become pregnant in the next year? N/A    Does the patient's partner want to become pregnant in the next year? N/A    Would the patient like to discuss contraceptive options today? N/A      Contraception Wrap Up   Current Method No Method - Other Reason   ablation   End Method No Method - Other Reason   ablation   Contraception Counseling Provided No          Assessment:     1. Night sweats Will increase estrace to 2 mg  2. Moody   3. Hormone replacement therapy (HRT) Meds ordered this encounter  Medications  . estradiol (ESTRACE) 2 MG tablet    Sig: Take 1 tablet (2 mg total) by mouth daily.    Dispense:  30 tablet    Refill:  6    Order Specific Question:   Supervising Provider    Answer:   Elonda Husky, LUTHER H [[id number]]  . progesterone (PROMETRIUM) 200 MG capsule    Sig: Take 1 at bedtime    Dispense:  30 capsule    Refill:  6    Order Specific Question:   Supervising Provider    Answer:   Florian Buff [[id number]]       Plan:     Follow up in 8 weeks for ROS

## 2020-05-21 ENCOUNTER — Encounter: Payer: Self-pay | Admitting: Family Medicine

## 2020-05-21 ENCOUNTER — Other Ambulatory Visit: Payer: Self-pay

## 2020-05-21 ENCOUNTER — Ambulatory Visit: Payer: BC Managed Care – PPO | Admitting: Family Medicine

## 2020-05-21 VITALS — BP 132/86 | HR 66 | Temp 98.4°F | Ht 67.0 in | Wt 206.6 lb

## 2020-05-21 DIAGNOSIS — Z8781 Personal history of (healed) traumatic fracture: Secondary | ICD-10-CM | POA: Diagnosis not present

## 2020-05-21 DIAGNOSIS — G43719 Chronic migraine without aura, intractable, without status migrainosus: Secondary | ICD-10-CM | POA: Diagnosis not present

## 2020-05-21 MED ORDER — ACETAMINOPHEN-CODEINE #3 300-30 MG PO TABS: 1.0000 | ORAL_TABLET | Freq: Four times a day (QID) | ORAL | 0 refills | Status: DC | PRN

## 2020-05-21 MED ORDER — AIMOVIG 70 MG/ML ~~LOC~~ SOAJ: 70.0000 mg | SUBCUTANEOUS | 99 refills | Status: DC

## 2020-05-21 NOTE — Progress Notes (Signed)
Subjective: CC: Migraine headaches PCP: Janora Norlander, DO NOB:SJGGEZ Courtney Joseph is a 45 y.o. female presenting to clinic today for:  1.  Migraine headaches Patient with ongoing migraine headaches that have in fact worsened since switching over to the [facility].  Not only are the injections painful they have been totally ineffective.  She has had at least 4 severe migraine headaches per month such that she had to miss work.  The migraine headaches have been refractory to Nurtec in the past.  She has Imitrex on hand but again is only able to use this a couple of times.  She has Tylenol 3 on hand if needed but again tries to use this sparingly due to addictive potential.  Her headaches were previously controlled on Aimovig but her insurance did not cover and therefore she was switched over in January.  She has not missed any injections and in fact is not due for her next injection until Wednesday.  Migraine headaches are described as severe and there are no apparent triggers except for sometimes strong perfume odors can trigger it.  They have been unchanged by adequate sleep, adequate hydration and avoidance of caffeine and chocolate.   ROS: Per HPI  Allergies  Allergen Reactions  . Sulfasalazine Hives  . Erythromycin Hives  . Keflex [Cephalexin] Hives  . Nsaids     Has gastric ulcer  . Penicillins Hives  . Sulfa Antibiotics Hives  . Tolmetin     Has gastric ulcer   Past Medical History:  Diagnosis Date  . Anxiety   . Bone spur   . Broken ankle 03/2018  . Chronic kidney disease    h/o kidney stones  . Depression   . Fatty liver   . Gastric erosions   . Headache    migraines  . Vaginal Pap smear, abnormal     Current Outpatient Medications:  .  Acetaminophen (TYLENOL ARTHRITIS PAIN PO), Take by mouth., Disp: , Rfl:  .  acetaminophen-codeine (TYLENOL #3) 300-30 MG tablet, Take 1 tablet by mouth every 6 (six) hours as needed for severe pain., Disp: 20 tablet, Rfl: 0 .   cetirizine (ZYRTEC) 10 MG tablet, Take 1 tablet (10 mg total) by mouth daily., Disp: 90 tablet, Rfl: 3 .  escitalopram (LEXAPRO) 20 MG tablet, Take 1 tablet (20 mg total) by mouth daily., Disp: 90 tablet, Rfl: 3 .  estradiol (ESTRACE) 2 MG tablet, Take 1 tablet (2 mg total) by mouth daily., Disp: 30 tablet, Rfl: 6 .  fluticasone (FLONASE) 50 MCG/ACT nasal spray, Place 2 sprays into both nostrils daily. (Patient taking differently: Place 2 sprays into both nostrils as needed.), Disp: 16 g, Rfl: 6 .  Galcanezumab-gnlm (EMGALITY) 120 MG/ML SOSY, Inject 240 mg month 1. Then follow with 147m monthly thereafter., Disp: 3 mL, Rfl: prn .  hyoscyamine (LEVSIN SL) 0.125 MG SL tablet, Place 1 tablet (0.125 mg total) under the tongue every 6 (six) hours as needed., Disp: 30 tablet, Rfl: 1 .  progesterone (PROMETRIUM) 200 MG capsule, Take 1 at bedtime, Disp: 30 capsule, Rfl: 6 .  SUMAtriptan (IMITREX) 100 MG tablet, Take 1 tablet (100 mg total) by mouth every 2 (two) hours as needed for migraine. May repeat in 2 hours if headache persists or recurs., Disp: 10 tablet, Rfl: 5 .  Vitamin D, Ergocalciferol, (DRISDOL) 1.25 MG (50000 UNIT) CAPS capsule, Take 1 capsule (50,000 Units total) by mouth every 7 (seven) days., Disp: 13 capsule, Rfl: 3 Social History   Socioeconomic History  .  Marital status: Divorced    Spouse name: Not on file  . Number of children: Not on file  . Years of education: Not on file  . Highest education level: Not on file  Occupational History  . Occupation: staff account  Tobacco Use  . Smoking status: Former Smoker    Quit date: 10/15/2011    Years since quitting: 8.6  . Smokeless tobacco: Never Used  Vaping Use  . Vaping Use: Former  Substance and Sexual Activity  . Alcohol use: Yes    Comment: Very RARE  . Drug use: No  . Sexual activity: Yes    Birth control/protection: Surgical    Comment: ablation  Other Topics Concern  . Not on file  Social History Narrative  . Not on  file   Social Determinants of Health   Financial Resource Strain: Low Risk   . Difficulty of Paying Living Expenses: Not hard at all  Food Insecurity: No Food Insecurity  . Worried About Charity fundraiser in the Last Year: Never true  . Ran Out of Food in the Last Year: Never true  Transportation Needs: No Transportation Needs  . Lack of Transportation (Medical): No  . Lack of Transportation (Non-Medical): No  Physical Activity: Inactive  . Days of Exercise per Week: 0 days  . Minutes of Exercise per Session: 0 min  Stress: Stress Concern Present  . Feeling of Stress : Very much  Social Connections: Moderately Isolated  . Frequency of Communication with Friends and Family: More than three times a week  . Frequency of Social Gatherings with Friends and Family: More than three times a week  . Attends Religious Services: Never  . Active Member of Clubs or Organizations: Yes  . Attends Archivist Meetings: 1 to 4 times per year  . Marital Status: Divorced  Human resources officer Violence: Not At Risk  . Fear of Current or Ex-Partner: No  . Emotionally Abused: No  . Physically Abused: No  . Sexually Abused: No   Family History  Problem Relation Age of Onset  . Hypertension Mother   . Depression Father   . Hypertension Father   . Depression Brother   . Learning disabilities Brother   . Cancer Maternal Aunt   . Diabetes Paternal Uncle   . Early death Maternal Grandfather   . Heart disease Maternal Grandfather   . Cancer Paternal Grandmother   . Depression Paternal Grandmother   . Diabetes Paternal Grandmother   . Heart disease Paternal Grandfather   . Colon cancer Neg Hx   . Esophageal cancer Neg Hx     Objective: Office vital signs reviewed. BP 132/86   Pulse 66   Temp 98.4 F (36.9 C)   Ht 5' 7"  (1.702 m)   Wt 206 lb 9.6 oz (93.7 kg)   SpO2 97%   BMI 32.36 kg/m   Physical Examination:  General: Awake, alert, well nourished, No acute distress MSK:  Ambulating independently. Neuro: No focal neurologic deficits.  Assessment/ Plan: 45 y.o. female   Intractable chronic migraine without aura and without status migrainosus - Plan: Erenumab-aooe (AIMOVIG) 64 MG/ML SOAJ  Going to see if we get a prior authorization for Aimovig.  Her migraine headaches were fairly under control with the Aimovig in the past.  She has been having much more severe migraine headaches such that she has been missing work on the [employer] and Nurtec was totally ineffective.  She has Tylenol 3 on hand if needed but  I would prefer her not to depend on an opioid for migraine headaches.  She may continue Imitrex as needed but again would limit the use of this given increased risk of CVA.  The Narcotic Database has been reviewed.  There were no red flags.    No orders of the defined types were placed in this encounter.  No orders of the defined types were placed in this encounter.    Janora Norlander, DO [place] [phone]

## 2020-05-23 ENCOUNTER — Telehealth: Payer: Self-pay

## 2020-05-26 NOTE — Telephone Encounter (Signed)
Wm called and PT called  Per [facility] does not need a PA - it goes through to insurance, but will be $660 /mo  They requested that she try to find a savings card-(we do not have those at [facility], pt aware to check their website)   Pt aware to try and find savings card, but she was very sure that we may have to go ahead and try something else due to high cost.   Any suggestions?

## 2020-05-26 NOTE — Telephone Encounter (Signed)
Let me run this by julie and see if there is a step edit/ tier reduction.  Otherwise, next step is Ajovy vs referral to neurology

## 2020-05-27 ENCOUNTER — Encounter: Payer: Self-pay | Admitting: Family Medicine

## 2020-05-28 ENCOUNTER — Encounter: Payer: Self-pay | Admitting: Adult Health

## 2020-05-28 ENCOUNTER — Other Ambulatory Visit: Payer: Self-pay

## 2020-05-28 ENCOUNTER — Ambulatory Visit: Payer: BC Managed Care – PPO | Admitting: Adult Health

## 2020-05-28 VITALS — BP 143/84 | HR 61 | Ht 67.0 in | Wt 205.0 lb

## 2020-05-28 DIAGNOSIS — R61 Generalized hyperhidrosis: Secondary | ICD-10-CM | POA: Diagnosis not present

## 2020-05-28 DIAGNOSIS — Z79890 Hormone replacement therapy (postmenopausal): Secondary | ICD-10-CM | POA: Diagnosis not present

## 2020-05-28 DIAGNOSIS — R4589 Other symptoms and signs involving emotional state: Secondary | ICD-10-CM | POA: Diagnosis not present

## 2020-05-28 NOTE — Progress Notes (Signed)
  Subjective:     Patient ID: Jenne Pane, female   DOB: Apr 12, 1975, 45 y.o.   MRN: [id number]  HPI Tashi is a 45 year old white female, divorced, G1P1 back in follow up after increasing estrace to 2 mg and is much better.  PCP is Dr Lajuana Ripple.   Review of Systems Nights sweats and moods much better since increasing estrace to 2 mg  Reviewed past medical,surgical, social and family history. Reviewed medications and allergies.     Objective:   Physical Exam BP (!) 143/84 (BP Location: Right Arm, Patient Position: Sitting, Cuff Size: Normal)   Pulse 61   Ht 5' 7"  (1.702 m)   Wt 205 lb (93 kg)   BMI 32.11 kg/m  Skin warm and dry. Lungs: clear to ausculation bilaterally. Cardiovascular: regular rate and rhythm.     Upstream - 05/28/20 1609      Pregnancy Intention Screening   Does the patient want to become pregnant in the next year? No    Does the patient's partner want to become pregnant in the next year? No    Would the patient like to discuss contraceptive options today? No      Contraception Wrap Up   Current Method No Method - Other Reason   ablation   End Method No Method - Other Reason   ablation   Contraception Counseling Provided No          Assessment:     1. Hormone replacement therapy (HRT) Continue estrace 2 mg daily and Prometrium 200 mg daily, has refills   2. Night sweats,much better  3. Moody,much better     Plan:     Follow up in 3 months or sooner if needed

## 2020-06-11 ENCOUNTER — Other Ambulatory Visit: Payer: Self-pay | Admitting: *Deleted

## 2020-06-11 MED ORDER — VITAMIN D (ERGOCALCIFEROL) 1.25 MG (50000 UNIT) PO CAPS: 50000.0000 [IU] | ORAL_CAPSULE | ORAL | 0 refills | Status: DC

## 2020-06-17 NOTE — Telephone Encounter (Signed)
I have not completed a tier reduction for this category of medications before.  I'm not sure if it would work.  We could tr yto call in [place] vs patient could call her insurance to see which is preferred.  They would also be able to tell her if they would allow a tier reduction.  May have a deductible??  There is also a coapy card online for Aimovig.  Has patient tried this?  Thank you!

## 2020-06-18 ENCOUNTER — Encounter: Payer: Self-pay | Admitting: Family Medicine

## 2020-06-18 MED ORDER — AJOVY 225 MG/1.5ML ~~LOC~~ SOAJ: 225.0000 ug | SUBCUTANEOUS | 6 refills | Status: DC

## 2020-06-18 NOTE — Telephone Encounter (Signed)
Ajovy Prescription sent to pharmacy. If insurance does not pay, will need referral to neurologists.

## 2020-06-18 NOTE — Telephone Encounter (Signed)
Already discussed with patient in [place]

## 2020-07-08 ENCOUNTER — Other Ambulatory Visit: Payer: Self-pay

## 2020-07-08 ENCOUNTER — Encounter: Payer: Self-pay | Admitting: Nurse Practitioner

## 2020-07-08 ENCOUNTER — Ambulatory Visit: Payer: BC Managed Care – PPO | Admitting: Nurse Practitioner

## 2020-07-08 VITALS — BP 127/73 | HR 64 | Temp 97.9°F | Ht 67.0 in | Wt 199.0 lb

## 2020-07-08 DIAGNOSIS — L237 Allergic contact dermatitis due to plants, except food: Secondary | ICD-10-CM | POA: Insufficient documentation

## 2020-07-08 MED ORDER — METHYLPREDNISOLONE ACETATE 40 MG/ML IJ SUSP
80.0000 mg | Freq: Once | INTRAMUSCULAR | Status: AC
Start: 2020-07-08 — End: 2020-07-08
  Administered 2020-07-08: 80 mg via INTRAMUSCULAR

## 2020-07-08 MED ORDER — HYDROCORTISONE 0.5 % EX CREA: 1.0000 "application " | TOPICAL_CREAM | Freq: Two times a day (BID) | CUTANEOUS | 0 refills | Status: DC

## 2020-07-08 MED ORDER — PREDNISONE 10 MG (21) PO TBPK
ORAL_TABLET | ORAL | 0 refills | Status: DC
Start: 2020-07-08 — End: 2020-09-16

## 2020-07-08 NOTE — Assessment & Plan Note (Signed)
Poison oak from cleaning out the garden in the last 24 hours.  Symptoms are worse and not well controlled.  Depo-Medrol 80 given in clinic, prednisone taper Cool compress, avoid hot showers, avoid scratching with fingernails, topical anti-itch cream. Follow-up with worsening unresolved symptoms.

## 2020-07-08 NOTE — Patient Instructions (Signed)
Poison Oak Dermatitis  Poison oak dermatitis is redness and soreness (inflammation) of the skin caused by chemicals in the leaves of the poison oak plant. Youmay have very bad itching, swelling, a rash, and blisters. What are the causes? You may get this condition by: Touching a poison oak plant. Touching something that has the chemical from the leaves on it. This may include animals or objects that have come in contact with the plant. What increases the risk? You are more likely to get this condition if you: Go outdoors often in wooded or marshy areas. Go outdoors without wearing protective clothing, such as closed shoes, long pants, and a long-sleeved shirt. What are the signs or symptoms? Symptoms of this condition include: Redness of the skin. Very bad itching. A rash that often includes bumps and blisters. The rash usually appears 48 hours after exposure if you have been exposed before. If this is the first time you have been exposed, the rash may not appear until a week after exposure. Swelling. This may occur if the reaction is very bad. Symptoms often clear up in 1-2 weeks. The first time you develop thiscondition, symptoms may last 3-4 weeks. How is this treated? This condition may be treated with: Hydrocortisone creams or calamine lotions to help with itching. Oatmeal baths to soothe the skin. Medicines to help reduce itching (antihistamines). If you have a very bad reaction, you may also be given steroid medicines. Follow these instructions at home: Medicines Take or apply over-the-counter and prescription medicines only as told by your doctor. Use hydrocortisone creams or calamine lotion as needed to help with itching. General instructions Do not scratch or rub your skin. Put a cold, wet cloth (cold compress) on the affected areas or take baths in cool water. This will help with itching. Avoid hot baths and showers. Take oatmeal baths as needed. Use colloidal oatmeal.  You can get this at a pharmacy or grocery store. Follow the instructions on the package. While you have the rash, wash your clothes right after you wear them. Keep all follow-up visits as told by your doctor. This is important. How is this prevented?  Know what poison oak looks like so you can avoid it. This plant has three leaves with flowering branches on a single stem. The leaves are fuzzy. The edges of the leaves look like teeth. If you have touched poison oak, wash your skin with soap and water right away. Be sure to wash under your fingernails. When hiking or camping, wear long pants, a long-sleeved shirt, tall socks, and hiking boots. You can also use a lotion on your skin that helps to prevent contact with the chemical on the plant. If you think that your clothes or outdoor gear came in contact with poison oak, rinse them off with a garden hose before you bring them inside your house. When doing yard work or gardening, wear gloves, long sleeves, long pants, and boots. Wash your garden tools and gloves if they come in contact with poison oak. If you think that your pet has come into contact with poison oak, wash him or her with pet shampoo and water. Make sure you wear gloves while washing your pet. Do not burn poison oak plants. This can release the chemical from the plant into the air and may cause a reaction. Contact a doctor if: You have open sores in the rash area. You have more redness, swelling, or pain in the affected area. You have redness that spreads beyond  the rash area. You have fluid, blood, or pus coming from the affected area. You have a fever. You have a rash over a large area of your body. You have a rash on your eyes, mouth, or genitals. Your rash does not improve after a few weeks. Get help right away if: Your face swells or your eyes swell shut. You have trouble breathing. You have trouble swallowing. These symptoms may be an emergency. Do not wait to see if  the symptoms will go away. Get medical help right away. Call your local emergency services (911 in the [place] Do not drive yourself to the hospital. Summary Poison oak dermatitis is redness and soreness of the skin caused by chemicals in the leaves of the poison oak plant. Symptoms of this condition include redness, very bad itching, a rash, and swelling. Do not scratch or rub your skin. Take or apply over-the-counter and prescription medicines only as told by your doctor. This information is not intended to replace advice given to you by your health care provider. Make sure you discuss any questions you have with your healthcare provider. Document Revised: 04/28/2018 Document Reviewed: 02/03/2018 Elsevier Patient Education  [place].

## 2020-07-08 NOTE — Progress Notes (Signed)
Acute Office Visit  Subjective:    Patient ID: Courtney Joseph, female    DOB: 1975-06-19, 45 y.o.   MRN: [id number]  Chief Complaint  Patient presents with   Rash    Rash This is a new problem. The current episode started yesterday. The problem has been gradually worsening since onset. The rash is diffuse. The rash is characterized by burning, itchiness, dryness and redness. She was exposed to plant contact. Pertinent negatives include no congestion, cough, facial edema, fatigue or vomiting. Past treatments include antihistamine. The treatment provided mild relief.    Past Medical History:  Diagnosis Date   Anxiety    Bone spur    Broken ankle 03/2018   Chronic kidney disease    h/o kidney stones   Depression    Fatty liver    Gastric erosions    Headache    migraines   Vaginal Pap smear, abnormal     Past Surgical History:  Procedure Laterality Date   BONE EXCISION Left 10/30/2015   Procedure: EXCISION OF LEFT CALCANEOUS BONE SPUR AT ANTERIOR PROCESS;  Surgeon: Wylene Simmer, MD;  Location: [facility];  Service: Orthopedics;  Laterality: Left;   right ankle break  Right 09/19/2018   uterine ablation     WISDOM TOOTH EXTRACTION      Family History  Problem Relation Age of Onset   Hypertension Mother    Depression Father    Hypertension Father    Depression Brother    Learning disabilities Brother    Cancer Maternal Aunt    Diabetes Paternal Uncle    Early death Maternal Grandfather    Heart disease Maternal Grandfather    Cancer Paternal Grandmother    Depression Paternal Grandmother    Diabetes Paternal Grandmother    Heart disease Paternal Grandfather    Colon cancer Neg Hx    Esophageal cancer Neg Hx     Social History   Socioeconomic History   Marital status: Divorced    Spouse name: Not on file   Number of children: Not on file   Years of education: Not on file   Highest education level: Not on file  Occupational History    Occupation: staff account  Tobacco Use   Smoking status: Former    Pack years: 0.00    Types: Cigarettes    Quit date: 10/15/2011    Years since quitting: 8.7   Smokeless tobacco: Never  Vaping Use   Vaping Use: Former  Substance and Sexual Activity   Alcohol use: Yes    Comment: Very RARE   Drug use: No   Sexual activity: Yes    Birth control/protection: Surgical    Comment: ablation  Other Topics Concern   Not on file  Social History Narrative   Not on file   Social Determinants of Health   Financial Resource Strain: Low Risk    Difficulty of Paying Living Expenses: Not hard at all  Food Insecurity: No Food Insecurity   Worried About Charity fundraiser in the Last Year: Never true   [place] in the Last Year: Never true  Transportation Needs: No Transportation Needs   Lack of Transportation (Medical): No   Lack of Transportation (Non-Medical): No  Physical Activity: Inactive   Days of Exercise per Week: 0 days   Minutes of Exercise per Session: 0 min  Stress: Stress Concern Present   Feeling of Stress : Very much  Social Connections: Moderately Isolated  Frequency of Communication with Friends and Family: More than three times a week   Frequency of Social Gatherings with Friends and Family: More than three times a week   Attends Religious Services: Never   Marine scientist or Organizations: Yes   Attends Music therapist: 1 to 4 times per year   Marital Status: Divorced  Human resources officer Violence: Not At Risk   Fear of Current or Ex-Partner: No   Emotionally Abused: No   Physically Abused: No   Sexually Abused: No    Outpatient Medications Prior to Visit  Medication Sig Dispense Refill   Acetaminophen (TYLENOL ARTHRITIS PAIN PO) Take by mouth.     acetaminophen-codeine (TYLENOL #3) 300-30 MG tablet Take 1 tablet by mouth every 6 (six) hours as needed for severe pain. 20 tablet 0   AIMOVIG 70 MG/ML SOAJ SMARTSIG:70 Milligram(s) SUB-Q      cetirizine (ZYRTEC) 10 MG tablet Take 1 tablet (10 mg total) by mouth daily. 90 tablet 3   escitalopram (LEXAPRO) 20 MG tablet Take 1 tablet (20 mg total) by mouth daily. 90 tablet 3   estradiol (ESTRACE) 2 MG tablet Take 1 tablet (2 mg total) by mouth daily. 30 tablet 6   fluticasone (FLONASE) 50 MCG/ACT nasal spray Place 2 sprays into both nostrils daily. (Patient taking differently: Place 2 sprays into both nostrils as needed.) 16 g 6   hyoscyamine (LEVSIN SL) 0.125 MG SL tablet Place 1 tablet (0.125 mg total) under the tongue every 6 (six) hours as needed. 30 tablet 1   progesterone (PROMETRIUM) 200 MG capsule Take 1 at bedtime 30 capsule 6   SUMAtriptan (IMITREX) 100 MG tablet Take 1 tablet (100 mg total) by mouth every 2 (two) hours as needed for migraine. May repeat in 2 hours if headache persists or recurs. 10 tablet 5   Vitamin D, Ergocalciferol, (DRISDOL) 1.25 MG (50000 UNIT) CAPS capsule Take 1 capsule (50,000 Units total) by mouth every 7 (seven) days. 13 capsule 0   Fremanezumab-vfrm (AJOVY) 225 MG/1.5ML SOAJ Inject 225 mcg into the skin every 30 (thirty) days. 1.68 mL 6   No facility-administered medications prior to visit.    Allergies  Allergen Reactions   Sulfasalazine Hives   Erythromycin Hives   Keflex [Cephalexin] Hives   Nsaids     Has gastric ulcer   Penicillins Hives   Sulfa Antibiotics Hives   Tolmetin     Has gastric ulcer    Review of Systems  Constitutional: Negative.  Negative for fatigue.  HENT:  Negative for congestion.   Respiratory:  Negative for cough.   Gastrointestinal:  Negative for vomiting.  Skin:  Positive for rash.  All other systems reviewed and are negative.     Objective:    Physical Exam Vitals and nursing note reviewed.  Constitutional:      Appearance: Normal appearance.  HENT:     Head: Normocephalic.     Right Ear: External ear normal.     Left Ear: External ear normal.     Nose: Nose normal.     Mouth/Throat:      Mouth: Mucous membranes are moist.     Pharynx: Oropharynx is clear.  Eyes:     Conjunctiva/sclera: Conjunctivae normal.     Pupils: Pupils are equal, round, and reactive to light.  Cardiovascular:     Pulses: Normal pulses.     Heart sounds: Normal heart sounds.  Pulmonary:     Effort: Pulmonary effort is normal.  Breath sounds: Normal breath sounds.  Abdominal:     General: Bowel sounds are normal.  Skin:    Findings: Rash present.  Neurological:     Mental Status: She is alert and oriented to person, place, and time.  Psychiatric:        Behavior: Behavior normal.    BP 127/73   Pulse 64   Temp 97.9 F (36.6 C) (Temporal)   Ht 5' 7"  (1.702 m)   Wt 199 lb (90.3 kg)   BMI 31.17 kg/m  Wt Readings from Last 3 Encounters:  07/08/20 199 lb (90.3 kg)  05/28/20 205 lb (93 kg)  05/21/20 206 lb 9.6 oz (93.7 kg)    Health Maintenance Due  Topic Date Due   Pneumococcal Vaccine 53-52 Years old (1 - PCV) Never done   Hepatitis C Screening  Never done   COLONOSCOPY (Pts 45-101yr Insurance coverage will need to be confirmed)  Never done   COVID-19 Vaccine (3 - Moderna risk series) 05/26/2019    There are no preventive care reminders to display for this patient.   Lab Results  Component Value Date   TSH 0.938 06/29/2019   Lab Results  Component Value Date   WBC 5.4 06/29/2019   HGB 13.6 06/29/2019   HCT 42.3 06/29/2019   MCV 83 06/29/2019   PLT 359 06/29/2019   Lab Results  Component Value Date   NA 138 06/29/2019   K 4.4 06/29/2019   CO2 23 06/29/2019   GLUCOSE 99 06/29/2019   BUN 9 06/29/2019   CREATININE 0.71 06/29/2019   BILITOT 0.3 06/29/2019   ALKPHOS 71 06/29/2019   AST 14 06/29/2019   ALT 15 06/29/2019   PROT 7.3 06/29/2019   ALBUMIN 4.3 06/29/2019   CALCIUM 9.9 06/29/2019   ANIONGAP 9 09/19/2018   GFR 96.21 12/30/2017   Lab Results  Component Value Date   CHOL 134 07/27/2018   Lab Results  Component Value Date   HDL 39 (L) 07/27/2018    Lab Results  Component Value Date   LDLCALC 74 07/27/2018   Lab Results  Component Value Date   TRIG 107 07/27/2018   Lab Results  Component Value Date   CHOLHDL 3.4 07/27/2018   No results found for: HGBA1C     Assessment & Plan:   Problem List Items Addressed This Visit       Musculoskeletal and Integument   Poison oak - Primary    Poison oak from cleaning out the garden in the last 24 hours.  Symptoms are worse and not well controlled.  Depo-Medrol 80 given in clinic, prednisone taper Cool compress, avoid hot showers, avoid scratching with fingernails, topical anti-itch cream. Follow-up with worsening unresolved symptoms.       Relevant Medications   predniSONE (STERAPRED UNI-PAK 21 TAB) 10 MG (21) TBPK tablet   hydrocortisone cream 0.5 %     Meds ordered this encounter  Medications   methylPREDNISolone acetate (DEPO-MEDROL) injection 80 mg   predniSONE (STERAPRED UNI-PAK 21 TAB) 10 MG (21) TBPK tablet    Sig: 6 tablets day one 5 tablet day 2, 4 tablet day 3, 3 tablet day 4, 2 tablet day 5, 1 tablet day 6    Dispense:  1 each    Refill:  0    Order Specific Question:   Supervising Provider    Answer:   GJanora Norlander[[[id number]]  hydrocortisone cream 0.5 %    Sig: Apply 1 application topically 2 (two)  times daily.    Dispense:  30 g    Refill:  0    Order Specific Question:   Supervising Provider    Answer:   Janora Norlander [[id number]]     Ivy Lynn, NP

## 2020-08-19 ENCOUNTER — Other Ambulatory Visit: Payer: Self-pay | Admitting: Family Medicine

## 2020-08-19 DIAGNOSIS — J301 Allergic rhinitis due to pollen: Secondary | ICD-10-CM

## 2020-08-28 ENCOUNTER — Ambulatory Visit: Payer: BC Managed Care – PPO | Admitting: Adult Health

## 2020-09-16 ENCOUNTER — Other Ambulatory Visit: Payer: Self-pay

## 2020-09-16 ENCOUNTER — Encounter: Payer: Self-pay | Admitting: Adult Health

## 2020-09-16 ENCOUNTER — Ambulatory Visit: Payer: BC Managed Care – PPO | Admitting: Adult Health

## 2020-09-16 VITALS — BP 124/82 | HR 67 | Ht 67.0 in | Wt 201.5 lb

## 2020-09-16 DIAGNOSIS — Z79890 Hormone replacement therapy (postmenopausal): Secondary | ICD-10-CM | POA: Diagnosis not present

## 2020-09-16 DIAGNOSIS — R232 Flushing: Secondary | ICD-10-CM

## 2020-09-16 MED ORDER — ESTRADIOL 2 MG PO TABS: 2.0000 mg | ORAL_TABLET | Freq: Every day | ORAL | 6 refills | Status: DC

## 2020-09-16 MED ORDER — PROGESTERONE 200 MG PO CAPS: ORAL_CAPSULE | ORAL | 6 refills | Status: DC

## 2020-09-16 NOTE — Progress Notes (Signed)
  Subjective:     Patient ID: Courtney Joseph, female   DOB: 02/21/1975, 45 y.o.   MRN: [id number]  HPI Courtney Joseph is a 45 year old white female,divorced, G1P1 back in follow up on HRT and moods much better but hot flashes back esp "after sun goes down". PCP is Dr Lajuana Ripple.  Review of Systems Moody much better, not as irritable Having hot flashes, esp "after sun goes down" Reviewed past medical,surgical, social and family history. Reviewed medications and allergies.     Objective:   Physical Exam BP 124/82 (BP Location: Left Arm, Patient Position: Sitting, Cuff Size: Large)   Pulse 67   Ht 5' 7"  (1.702 m)   Wt 201 lb 8 oz (91.4 kg)   BMI 31.56 kg/m  Skin warm and dry.  Lungs: clear to ausculation bilaterally. Cardiovascular: regular rate and rhythm.    Fall risk is low  Upstream - 09/16/20 1019       Pregnancy Intention Screening   Does the patient want to become pregnant in the next year? No    Does the patient's partner want to become pregnant in the next year? No    Would the patient like to discuss contraceptive options today? No      Contraception Wrap Up   Current Method Female Sterilization   ablation   End Method Female Sterilization   ablation            Assessment:       1. Hormone replacement therapy (HRT) Continue estrace and Prometrium Meds ordered this encounter  Medications   estradiol (ESTRACE) 2 MG tablet    Sig: Take 1 tablet (2 mg total) by mouth daily.    Dispense:  30 tablet    Refill:  6    Order Specific Question:   Supervising Provider    Answer:   Tania Ade H [[id number]]   progesterone (PROMETRIUM) 200 MG capsule    Sig: Take 1 at bedtime    Dispense:  30 capsule    Refill:  6    Order Specific Question:   Supervising Provider    Answer:   Elonda Husky, LUTHER H [[id number]]     2. Hot flashes Try taking estrace at night     Plan:     Return in January for pap and physical

## 2020-09-21 ENCOUNTER — Other Ambulatory Visit: Payer: Self-pay | Admitting: Family Medicine

## 2020-09-21 DIAGNOSIS — F339 Major depressive disorder, recurrent, unspecified: Secondary | ICD-10-CM

## 2020-09-23 ENCOUNTER — Telehealth: Payer: Self-pay | Admitting: Family Medicine

## 2020-09-23 DIAGNOSIS — F339 Major depressive disorder, recurrent, unspecified: Secondary | ICD-10-CM

## 2020-09-23 MED ORDER — ESCITALOPRAM OXALATE 20 MG PO TABS: 20.0000 mg | ORAL_TABLET | Freq: Every day | ORAL | 0 refills | Status: DC

## 2020-09-23 NOTE — Telephone Encounter (Signed)
  Prescription Request  09/23/2020  Is this a "Controlled Substance" medicine? NO Have you seen your PCP in the last 2 weeks? NO, pt has appt on 09/28 would like a month supply she is out of this medication  If YES, route message to pool  -  If NO, patient needs to be scheduled for appointment.  What is the name of the medication or equipment?lexapro  Have you contacted your pharmacy to request a refill? yes  Which pharmacy would you like this sent to? [facility]   Patient notified that their request is being sent to the clinical staff for review and that they should receive a response within 2 business days.

## 2020-09-23 NOTE — Telephone Encounter (Signed)
One moth supply sent in patient aware.

## 2020-10-15 ENCOUNTER — Encounter: Payer: Self-pay | Admitting: Family Medicine

## 2020-10-15 ENCOUNTER — Ambulatory Visit: Payer: BC Managed Care – PPO | Admitting: Family Medicine

## 2020-10-15 ENCOUNTER — Other Ambulatory Visit: Payer: Self-pay

## 2020-10-15 VITALS — BP 142/80 | HR 59 | Temp 97.3°F | Ht 67.0 in | Wt 201.6 lb

## 2020-10-15 DIAGNOSIS — E669 Obesity, unspecified: Secondary | ICD-10-CM

## 2020-10-15 DIAGNOSIS — G43009 Migraine without aura, not intractable, without status migrainosus: Secondary | ICD-10-CM | POA: Diagnosis not present

## 2020-10-15 DIAGNOSIS — G4719 Other hypersomnia: Secondary | ICD-10-CM | POA: Diagnosis not present

## 2020-10-15 DIAGNOSIS — Z79899 Other long term (current) drug therapy: Secondary | ICD-10-CM

## 2020-10-15 DIAGNOSIS — Z1211 Encounter for screening for malignant neoplasm of colon: Secondary | ICD-10-CM

## 2020-10-15 DIAGNOSIS — F339 Major depressive disorder, recurrent, unspecified: Secondary | ICD-10-CM

## 2020-10-15 DIAGNOSIS — R0683 Snoring: Secondary | ICD-10-CM | POA: Diagnosis not present

## 2020-10-15 DIAGNOSIS — E559 Vitamin D deficiency, unspecified: Secondary | ICD-10-CM

## 2020-10-15 DIAGNOSIS — Z130 Encounter for screening for diseases of the blood and blood-forming organs and certain disorders involving the immune mechanism: Secondary | ICD-10-CM

## 2020-10-15 MED ORDER — ESCITALOPRAM OXALATE 20 MG PO TABS: 20.0000 mg | ORAL_TABLET | Freq: Every day | ORAL | 3 refills | Status: DC

## 2020-10-15 MED ORDER — ACETAMINOPHEN-CODEINE #3 300-30 MG PO TABS: 1.0000 | ORAL_TABLET | Freq: Four times a day (QID) | ORAL | 0 refills | Status: DC | PRN

## 2020-10-15 MED ORDER — VITAMIN D (ERGOCALCIFEROL) 1.25 MG (50000 UNIT) PO CAPS: 50000.0000 [IU] | ORAL_CAPSULE | ORAL | 3 refills | Status: DC

## 2020-10-15 NOTE — Patient Instructions (Addendum)
You had labs performed today.  You will be contacted with the results of the labs once they are available, usually in the next 3 business days for routine lab work.  If you have an active my chart account, they will be released to your MyChart.  If you prefer to have these labs released to you via telephone, please let us know.  If you had a pap smear or biopsy performed, expect to be contacted in about 7-10 days.  Sleep Apnea Sleep apnea is a condition in which breathing pauses or becomes shallow during sleep. People with sleep apnea usually snore loudly. They may have times when they gasp and stop breathing for 10 seconds or more during sleep. This may happen many times during the night. Sleep apnea disrupts your sleep and keeps your body from getting the rest that it needs. This condition can increase your risk of certain health problems, including: Heart attack. Stroke. Obesity. Type 2 diabetes. Heart failure. Irregular heartbeat. High blood pressure. The goal of treatment is to help you breathe normally again. What are the causes? The most common cause of sleep apnea is a collapsed or blocked airway. There are three kinds of sleep apnea: Obstructive sleep apnea. This kind is caused by a blocked or collapsed airway. Central sleep apnea. This kind happens when the part of the brain that controls breathing does not send the correct signals to the muscles that control breathing. Mixed sleep apnea. This is a combination of obstructive and central sleep apnea. What increases the risk? You are more likely to develop this condition if you: Are overweight. Smoke. Have a smaller than normal airway. Are older. Are female. Drink alcohol. Take sedatives or tranquilizers. Have a family history of sleep apnea. Have a tongue or tonsils that are larger than normal. What are the signs or symptoms? Symptoms of this condition include: Trouble staying asleep. Loud snoring. Morning  headaches. Waking up gasping. Dry mouth or sore throat in the morning. Daytime sleepiness and tiredness. If you have daytime fatigue because of sleep apnea, you may be more likely to have: Trouble concentrating. Forgetfulness. Irritability or mood swings. Personality changes. Feelings of depression. Sexual dysfunction. This may include loss of interest if you are female, or erectile dysfunction if you are female. How is this diagnosed? This condition may be diagnosed with: A medical history. A physical exam. A series of tests that are done while you are sleeping (sleep study). These tests are usually done in a sleep lab, but they may also be done at home. How is this treated? Treatment for this condition aims to restore normal breathing and to ease symptoms during sleep. It may involve managing health issues that can affect breathing, such as high blood pressure or obesity. Treatment may include: Sleeping on your side. Using a decongestant if you have nasal congestion. Avoiding the use of depressants, including alcohol, sedatives, and narcotics. Losing weight if you are overweight. Making changes to your diet. Quitting smoking. Using a device to open your airway while you sleep, such as: An oral appliance. This is a custom-made mouthpiece that shifts your lower jaw forward. A continuous positive airway pressure (CPAP) device. This device blows air through a mask when you breathe out (exhale). A nasal expiratory positive airway pressure (EPAP) device. This device has valves that you put into each nostril. A bi-level positive airway pressure (BPAP) device. This device blows air through a mask when you breathe in (inhale) and breathe out (exhale). Having surgery if  other treatments do not work. During surgery, excess tissue is removed to create a wider airway. Follow these instructions at home: Lifestyle Make any lifestyle changes that your health care provider recommends. Eat a healthy,  well-balanced diet. Take steps to lose weight if you are overweight. Avoid using depressants, including alcohol, sedatives, and narcotics. Do not use any products that contain nicotine or tobacco. These products include cigarettes, chewing tobacco, and vaping devices, such as e-cigarettes. If you need help quitting, ask your health care provider. General instructions Take over-the-counter and prescription medicines only as told by your health care provider. If you were given a device to open your airway while you sleep, use it only as told by your health care provider. If you are having surgery, make sure to tell your health care provider you have sleep apnea. You may need to bring your device with you. Keep all follow-up visits. This is important. Contact a health care provider if: The device that you received to open your airway during sleep is uncomfortable or does not seem to be working. Your symptoms do not improve. Your symptoms get worse. Get help right away if: You develop: Chest pain. Shortness of breath. Discomfort in your back, arms, or stomach. You have: Trouble speaking. Weakness on one side of your body. Drooping in your face. These symptoms may represent a serious problem that is an emergency. Do not wait to see if the symptoms will go away. Get medical help right away. Call your local emergency services (911 in the [place] Do not drive yourself to the hospital. Summary Sleep apnea is a condition in which breathing pauses or becomes shallow during sleep. The most common cause is a collapsed or blocked airway. The goal of treatment is to restore normal breathing and to ease symptoms during sleep. This information is not intended to replace advice given to you by your health care provider. Make sure you discuss any questions you have with your health care provider. Document Revised: 12/14/2019 Document Reviewed: 12/14/2019 Elsevier Patient Education  2022 [employer].

## 2020-10-15 NOTE — Progress Notes (Signed)
Subjective: CC: migraine headaches PCP: Janora Norlander, DO YOV:ZCHYIF Iannello is a 45 y.o. female presenting to clinic today for:  1.  Migraine headaches Patient reports that she is been having increased exacerbation of migraine headaches.  She describes them as being a biparietal and manifesting in the left thigh.  Sometimes that eye starts twitching and almost clamped down.  This is refractory to Aimovig, Imitrex.  She uses Tylenol 3 in efforts to abort the headaches.  She reports associated nausea.  She has been experiencing increasing tiredness and admits that she only sleeps perhaps 4 to 5 hours per night, often having interrupted sleep where she cannot go back to sleep.  She does not utilize any sleep aids currently.  She admits that she does not hydrate as well as she could.  She does snore at nighttime.  Has never had a sleep test done before.  2.  Anxiety depression Patient is compliant with Lexapro 20 mg daily.  Needs refills  3.  Vitamin D deficiency She reports increased fatigue as above.  Would like to go back on vitamin D as she wonders if perhaps this is causing.   ROS: Per HPI  Allergies  Allergen Reactions   Sulfasalazine Hives   Erythromycin Hives   Keflex [Cephalexin] Hives   Nsaids     Has gastric ulcer   Penicillins Hives   Sulfa Antibiotics Hives   Tolmetin     Has gastric ulcer   Past Medical History:  Diagnosis Date   Anxiety    Bone spur    Broken ankle 03/2018   Chronic kidney disease    h/o kidney stones   Depression    Fatty liver    Gastric erosions    Headache    migraines   Vaginal Pap smear, abnormal     Current Outpatient Medications:    AIMOVIG 70 MG/ML SOAJ, SMARTSIG:70 Milligram(s) SUB-Q, Disp: , Rfl:    cetirizine (ZYRTEC) 10 MG tablet, Take 1 tablet by mouth once daily, Disp: 90 tablet, Rfl: 1   estradiol (ESTRACE) 2 MG tablet, Take 1 tablet (2 mg total) by mouth daily., Disp: 30 tablet, Rfl: 6   fluticasone (FLONASE) 50  MCG/ACT nasal spray, Place 2 sprays into both nostrils daily. (Patient taking differently: Place 2 sprays into both nostrils as needed.), Disp: 16 g, Rfl: 6   hydrocortisone cream 0.5 %, Apply 1 application topically 2 (two) times daily., Disp: 30 g, Rfl: 0   hyoscyamine (LEVSIN SL) 0.125 MG SL tablet, Place 1 tablet (0.125 mg total) under the tongue every 6 (six) hours as needed., Disp: 30 tablet, Rfl: 1   MAGNESIUM PO, Take by mouth daily., Disp: , Rfl:    progesterone (PROMETRIUM) 200 MG capsule, Take 1 at bedtime, Disp: 30 capsule, Rfl: 6   SUMAtriptan (IMITREX) 100 MG tablet, Take 1 tablet (100 mg total) by mouth every 2 (two) hours as needed for migraine. May repeat in 2 hours if headache persists or recurs., Disp: 10 tablet, Rfl: 5   acetaminophen-codeine (TYLENOL #3) 300-30 MG tablet, Take 1 tablet by mouth every 6 (six) hours as needed for severe pain., Disp: 20 tablet, Rfl: 0   escitalopram (LEXAPRO) 20 MG tablet, Take 1 tablet (20 mg total) by mouth daily., Disp: 90 tablet, Rfl: 3   Vitamin D, Ergocalciferol, (DRISDOL) 1.25 MG (50000 UNIT) CAPS capsule, Take 1 capsule (50,000 Units total) by mouth every 7 (seven) days., Disp: 13 capsule, Rfl: 3 Social History   Socioeconomic  History   Marital status: Divorced    Spouse name: Not on file   Number of children: Not on file   Years of education: Not on file   Highest education level: Not on file  Occupational History   Occupation: staff account  Tobacco Use   Smoking status: Former    Types: Cigarettes    Quit date: 10/15/2011    Years since quitting: 9.0   Smokeless tobacco: Never  Vaping Use   Vaping Use: Former  Substance and Sexual Activity   Alcohol use: Yes    Comment: Very RARE   Drug use: No   Sexual activity: Yes    Birth control/protection: Surgical    Comment: ablation  Other Topics Concern   Not on file  Social History Narrative   Not on file   Social Determinants of Health   Financial Resource Strain: Low  Risk    Difficulty of Paying Living Expenses: Not hard at all  Food Insecurity: No Food Insecurity   Worried About Charity fundraiser in the Last Year: Never true   [place] in the Last Year: Never true  Transportation Needs: No Transportation Needs   Lack of Transportation (Medical): No   Lack of Transportation (Non-Medical): No  Physical Activity: Inactive   Days of Exercise per Week: 0 days   Minutes of Exercise per Session: 0 min  Stress: Stress Concern Present   Feeling of Stress : Very much  Social Connections: Moderately Isolated   Frequency of Communication with Friends and Family: More than three times a week   Frequency of Social Gatherings with Friends and Family: More than three times a week   Attends Religious Services: Never   Marine scientist or Organizations: Yes   Attends Music therapist: 1 to 4 times per year   Marital Status: Divorced  Human resources officer Violence: Not At Risk   Fear of Current or Ex-Partner: No   Emotionally Abused: No   Physically Abused: No   Sexually Abused: No   Family History  Problem Relation Age of Onset   Hypertension Mother    Depression Father    Hypertension Father    Depression Brother    Learning disabilities Brother    Cancer Maternal Aunt    Diabetes Paternal Uncle    Early death Maternal Grandfather    Heart disease Maternal Grandfather    Cancer Paternal Grandmother    Depression Paternal Grandmother    Diabetes Paternal Grandmother    Heart disease Paternal Grandfather    Colon cancer Neg Hx    Esophageal cancer Neg Hx     Objective: Office vital signs reviewed. BP (!) 142/80   Pulse (!) 59   Temp (!) 97.3 F (36.3 C)   Ht 5' 7"  (1.702 m)   Wt 201 lb 9.6 oz (91.4 kg)   SpO2 99%   BMI 31.58 kg/m   Physical Examination:  General: Awake, alert, obese, No acute distress HEENT: Normal; TMs intact bilaterally.  Moist mucous membranes.  Sclera white Cardio: regular rate and rhythm,  S1S2 heard, no murmurs appreciated Pulm: clear to auscultation bilaterally, no wheezes, rhonchi or rales; normal work of breathing on room air MSK: Normal gait and station Neuro: Current nerves II through XII is intact.  No focal neurologic deficits Psych: Mood stable, speech normal.  Patient is pleasant and interactive  Depression screen [facility] 2/9 10/15/2020 02/13/2020 07/25/2019  Decreased Interest 1 1 1   Down, Depressed,  Hopeless 1 1 0  PHQ - 2 Score 2 2 1   Altered sleeping 2 3 2   Tired, decreased energy 3 2 2   Change in appetite 1 1 0  Feeling bad or failure about yourself  0 1 0  Trouble concentrating 2 0 1  Moving slowly or fidgety/restless 0 0 0  Suicidal thoughts 0 0 0  PHQ-9 Score 10 9 6   Difficult doing work/chores Not difficult at all - Somewhat difficult  Some recent data might be hidden   GAD 7 : Generalized Anxiety Score 10/15/2020 02/13/2020 04/01/2016  Nervous, Anxious, on Edge 0 1 0  Control/stop worrying 1 1 0  Worry too much - different things 1 1 0  Trouble relaxing 0 2 3  Restless 0 1 0  Easily annoyed or irritable 1 2 3   Afraid - awful might happen 0 0 0  Total GAD 7 Score 3 8 6   Anxiety Difficulty Somewhat difficult - Somewhat difficult   Assessment/ Plan: 45 y.o. female   Migraine without aura and without status migrainosus, not intractable - Plan: acetaminophen-codeine (TYLENOL #3) 300-30 MG tablet, Drug Screen 10 W/Conf, Se, Ambulatory referral to Neurology  Controlled substance agreement signed - Plan: Drug Screen 10 W/Conf, Se  Excessive daytime sleepiness - Plan: Ambulatory referral to Neurology, TSH, Vitamin B12, T4, Free  Snoring - Plan: Ambulatory referral to Neurology  Vitamin D insufficiency - Plan: VITAMIN D 25 Hydroxy (Vit-D Deficiency, Fractures)  Depression, recurrent ([place]) - Plan: escitalopram (LEXAPRO) 20 MG tablet  Obesity (BMI 30.0-34.9) - Plan: CMP14+EGFR, Lipid Panel  Screening for deficiency anemia - Plan: CBC  I certainly think  exasperations her migraine headaches are multifactorial including stress, likely an undiagnosed sleep apnea and inadequate hydration.  Reinforced need for adequate hydration.  May consider adding melatonin for improved sleep.  We discussed alternatives including RozaREM and Belsomra.  I placed a referral to neurology for further assistance with this patient who is still having breakthrough migraines despite multiple medications.  She is never seen a neurologist I think this would be helpful.  We discussed that she would likely be evaluated for obstructive sleep apnea as well given excessive daytime sleepiness, body habitus and snoring.  She was amenable to this.  Check vitamin D level.  Vitamin D has been renewed  Lexapro renewed for depression.  I think she may benefit from Wellbutrin/ counseling.  Some of the answers that she tested positive for already excessive sleepiness and tiredness.  Sleep improvement may improve her depressive symptoms but I have offered her alternative treatment as well.  Check fasting lipid panel, CMP given obesity  CBC also ordered   Orders Placed This Encounter  Procedures   CMP14+EGFR   Lipid Panel   CBC   VITAMIN D 25 Hydroxy (Vit-D Deficiency, Fractures)   Drug Screen 10 W/Conf, Se   TSH   Vitamin B12   T4, Free   Ambulatory referral to Neurology    Referral Priority:   Routine    Referral Type:   Consultation    Referral Reason:   Specialty Services Required    Requested Specialty:   Neurology    Number of Visits Requested:   1   Meds ordered this encounter  Medications   escitalopram (LEXAPRO) 20 MG tablet    Sig: Take 1 tablet (20 mg total) by mouth daily.    Dispense:  90 tablet    Refill:  3   Vitamin D, Ergocalciferol, (DRISDOL) 1.25 MG (50000 UNIT) CAPS  capsule    Sig: Take 1 capsule (50,000 Units total) by mouth every 7 (seven) days.    Dispense:  13 capsule    Refill:  3   acetaminophen-codeine (TYLENOL #3) 300-30 MG tablet    Sig: Take  1 tablet by mouth every 6 (six) hours as needed for severe pain.    Dispense:  20 tablet    Refill:  [place], DO [place] [phone]

## 2020-10-16 ENCOUNTER — Telehealth: Payer: Self-pay | Admitting: *Deleted

## 2020-10-16 DIAGNOSIS — G43009 Migraine without aura, not intractable, without status migrainosus: Secondary | ICD-10-CM

## 2020-10-16 NOTE — Telephone Encounter (Signed)
Courtney Joseph (Key: [id number]) Aimovig 70MG/ML auto-injectors Pa started  Sent to plan

## 2020-10-17 ENCOUNTER — Other Ambulatory Visit: Payer: Self-pay | Admitting: Family Medicine

## 2020-10-17 DIAGNOSIS — F339 Major depressive disorder, recurrent, unspecified: Secondary | ICD-10-CM

## 2020-10-17 MED ORDER — BUPROPION HCL ER (XL) 150 MG PO TB24: 150.0000 mg | ORAL_TABLET | Freq: Every day | ORAL | 0 refills | Status: DC

## 2020-10-24 LAB — OXYCODONES,MS,WB/SP RFX
Oxycocone: NEGATIVE ng/mL
Oxycodones Confirmation: NEGATIVE
Oxymorphone: NEGATIVE ng/mL

## 2020-10-24 LAB — CMP14+EGFR
ALT: 28 IU/L (ref 0–32)
AST: 23 IU/L (ref 0–40)
Albumin/Globulin Ratio: 1.7 (ref 1.2–2.2)
Albumin: 4.8 g/dL (ref 3.8–4.8)
Alkaline Phosphatase: 81 IU/L (ref 44–121)
BUN/Creatinine Ratio: 16 (ref 9–23)
BUN: 11 mg/dL (ref 6–24)
Bilirubin Total: 0.5 mg/dL (ref 0.0–1.2)
CO2: 22 mmol/L (ref 20–29)
Calcium: 9.7 mg/dL (ref 8.7–10.2)
Chloride: 100 mmol/L (ref 96–106)
Creatinine, Ser: 0.7 mg/dL (ref 0.57–1.00)
Globulin, Total: 2.9 g/dL (ref 1.5–4.5)
Glucose: 99 mg/dL (ref 70–99)
Potassium: 4.1 mmol/L (ref 3.5–5.2)
Sodium: 139 mmol/L (ref 134–144)
Total Protein: 7.7 g/dL (ref 6.0–8.5)
eGFR: 107 mL/min/{1.73_m2} (ref >59)

## 2020-10-24 LAB — OPIATES,MS,WB/SP RFX
6-Acetylmorphine: NEGATIVE
Codeine: 3.3 ng/mL
Dihydrocodeine: NEGATIVE ng/mL
Hydrocodone: NEGATIVE ng/mL
Hydromorphone: NEGATIVE ng/mL
Morphine: NEGATIVE ng/mL
Opiate Confirmation: POSITIVE

## 2020-10-24 LAB — LIPID PANEL
Chol/HDL Ratio: 4.4 ratio (ref 0.0–4.4)
Cholesterol, Total: 186 mg/dL (ref 100–199)
HDL: 42 mg/dL
LDL Chol Calc (NIH): 125 mg/dL — ABNORMAL HIGH (ref 0–99)
Triglycerides: 106 mg/dL (ref 0–149)
VLDL Cholesterol Cal: 19 mg/dL (ref 5–40)

## 2020-10-24 LAB — DRUG SCREEN 10 W/CONF, SERUM
Amphetamines, IA: NEGATIVE ng/mL
Barbiturates, IA: NEGATIVE ug/mL
Benzodiazepines, IA: NEGATIVE ng/mL
Cocaine & Metabolite, IA: NEGATIVE ng/mL
Methadone, IA: NEGATIVE ng/mL
Opiates, IA: POSITIVE ng/mL — AB
Oxycodones, IA: NEGATIVE ng/mL
Phencyclidine, IA: NEGATIVE ng/mL
Propoxyphene, IA: NEGATIVE ng/mL
THC(Marijuana) Metabolite, IA: NEGATIVE ng/mL

## 2020-10-24 LAB — CBC
Hematocrit: 41.6 % (ref 34.0–46.6)
Hemoglobin: 13.6 g/dL (ref 11.1–15.9)
MCH: 27.6 pg (ref 26.6–33.0)
MCHC: 32.7 g/dL (ref 31.5–35.7)
MCV: 85 fL (ref 79–97)
Platelets: 345 x10E3/uL (ref 150–450)
RBC: 4.92 x10E6/uL (ref 3.77–5.28)
RDW: 13 % (ref 11.7–15.4)
WBC: 6.6 x10E3/uL (ref 3.4–10.8)

## 2020-10-24 LAB — TSH: TSH: 1.06 u[IU]/mL (ref 0.450–4.500)

## 2020-10-24 LAB — T4, FREE: Free T4: 1.03 ng/dL (ref 0.82–1.77)

## 2020-10-24 LAB — VITAMIN D 25 HYDROXY (VIT D DEFICIENCY, FRACTURES): Vit D, 25-Hydroxy: 34.2 ng/mL (ref 30.0–100.0)

## 2020-10-24 LAB — VITAMIN B12: Vitamin B-12: 261 pg/mL (ref 232–1245)

## 2020-10-27 NOTE — Telephone Encounter (Signed)
PA denied for Aimovig.

## 2020-10-28 LAB — COLOGUARD: Cologuard: POSITIVE — AB

## 2020-10-28 NOTE — Telephone Encounter (Signed)
Please make pt aware.  Have her call her insurance/ check her formulary to inquire on approved alternatives for her Aimovig

## 2020-10-28 NOTE — Telephone Encounter (Signed)
na

## 2020-10-29 ENCOUNTER — Other Ambulatory Visit: Payer: Self-pay | Admitting: Family Medicine

## 2020-10-29 ENCOUNTER — Encounter: Payer: Self-pay | Admitting: Family Medicine

## 2020-10-29 DIAGNOSIS — R195 Other fecal abnormalities: Secondary | ICD-10-CM

## 2020-10-30 ENCOUNTER — Other Ambulatory Visit: Payer: Self-pay | Admitting: Family Medicine

## 2020-10-30 DIAGNOSIS — R195 Other fecal abnormalities: Secondary | ICD-10-CM

## 2020-11-04 NOTE — Telephone Encounter (Signed)
I looked up patients  drug formulary.  The preferred choices are Ajovy and [employer]

## 2020-11-04 NOTE — Telephone Encounter (Signed)
Apparently, patient is using a manufacturer coupon that is overriding her ins PA.  Only costing her $5 so ok to close PA

## 2020-11-21 ENCOUNTER — Encounter: Payer: BC Managed Care – PPO | Admitting: Gastroenterology

## 2020-11-27 ENCOUNTER — Encounter: Payer: Self-pay | Admitting: Family Medicine

## 2020-11-28 MED ORDER — AIMOVIG 70 MG/ML ~~LOC~~ SOAJ: SUBCUTANEOUS | 99 refills | Status: DC

## 2020-11-28 NOTE — Telephone Encounter (Signed)
TC to pt explained about the older mssg on the PA and that refill was sent to pharmacy

## 2020-12-01 ENCOUNTER — Other Ambulatory Visit: Payer: Self-pay | Admitting: Family Medicine

## 2020-12-01 DIAGNOSIS — G43009 Migraine without aura, not intractable, without status migrainosus: Secondary | ICD-10-CM

## 2020-12-05 ENCOUNTER — Telehealth: Payer: Self-pay | Admitting: Family Medicine

## 2020-12-05 NOTE — Telephone Encounter (Signed)
Please advise what patient should do until PA is completed

## 2020-12-05 NOTE — Telephone Encounter (Signed)
Pt states that she was told her prior auth had been completed but [facility] has not received and she is unable to get her medicine for migraines. She is not able to work because of the migraines and heeds this medicine. Please call back and advise.

## 2020-12-05 NOTE — Telephone Encounter (Signed)
Spoke to pt.  She will come get some Emgality sample until PA is complete.

## 2020-12-08 ENCOUNTER — Other Ambulatory Visit: Payer: Self-pay

## 2020-12-08 ENCOUNTER — Encounter: Payer: Self-pay | Admitting: Internal Medicine

## 2020-12-08 ENCOUNTER — Ambulatory Visit (AMBULATORY_SURGERY_CENTER): Payer: BC Managed Care – PPO

## 2020-12-08 VITALS — Ht 67.0 in | Wt 198.0 lb

## 2020-12-08 DIAGNOSIS — R195 Other fecal abnormalities: Secondary | ICD-10-CM

## 2020-12-08 MED ORDER — NA SULFATE-K SULFATE-MG SULF 17.5-3.13-1.6 GM/177ML PO SOLN: 1.0000 | Freq: Once | ORAL | 0 refills | Status: AC

## 2020-12-08 NOTE — Progress Notes (Signed)
Pre visit completed via phone call;  Patient verified name, DOB, and address; No egg or soy allergy known to patient  No issues known to pt with past sedation with any surgeries or procedures Patient denies ever being told they had issues or difficulty with intubation  No FH of Malignant Hyperthermia Pt is not on diet pills Pt is not on home 02  Pt is not on blood thinners  Pt reports occasional issues with constipation -patient reports she increases fluid intake and issue resolves; No A fib or A flutter Pt is fully vaccinated for Covid x 2; NO PA's for preps discussed with pt in PV today  Discussed with pt there will be an out-of-pocket cost for prep and that varies from $0 to 70 +  dollars - pt verbalized understanding  Due to the COVID-19 pandemic we are asking patients to follow certain guidelines in PV and the [place]   Pt aware of COVID protocols and [facility] guidelines

## 2020-12-08 NOTE — Telephone Encounter (Signed)
(  Key: [id number]) Aimovig 70MG/ML auto-injectors- PA in process/ww

## 2020-12-08 NOTE — Telephone Encounter (Signed)
(  Key: [id number]) Aimovig 70MG/ML auto-injectors- PA in process

## 2020-12-09 ENCOUNTER — Encounter: Payer: Self-pay | Admitting: Internal Medicine

## 2020-12-09 DIAGNOSIS — R195 Other fecal abnormalities: Secondary | ICD-10-CM

## 2020-12-09 MED ORDER — PLENVU 140 G PO SOLR: 1.0000 | Freq: Once | ORAL | 0 refills | Status: AC

## 2020-12-10 NOTE — Telephone Encounter (Signed)
Message from Plan Your PA request has been approved. Additional information will be provided in the approval communication. (Message [id number])  Lygia Bessey (Key: [id number]) Aimovig 70MG/ML auto-injectors  Pharm aware

## 2020-12-23 ENCOUNTER — Ambulatory Visit (AMBULATORY_SURGERY_CENTER): Payer: BC Managed Care – PPO | Admitting: Internal Medicine

## 2020-12-23 ENCOUNTER — Other Ambulatory Visit: Payer: Self-pay

## 2020-12-23 ENCOUNTER — Encounter: Payer: Self-pay | Admitting: Internal Medicine

## 2020-12-23 VITALS — BP 124/82 | HR 68 | Temp 98.2°F | Resp 10 | Ht 67.0 in | Wt 198.0 lb

## 2020-12-23 DIAGNOSIS — D3615 Benign neoplasm of peripheral nerves and autonomic nervous system of abdomen: Secondary | ICD-10-CM

## 2020-12-23 DIAGNOSIS — K5730 Diverticulosis of large intestine without perforation or abscess without bleeding: Secondary | ICD-10-CM | POA: Diagnosis not present

## 2020-12-23 DIAGNOSIS — K648 Other hemorrhoids: Secondary | ICD-10-CM

## 2020-12-23 DIAGNOSIS — R195 Other fecal abnormalities: Secondary | ICD-10-CM

## 2020-12-23 DIAGNOSIS — D124 Benign neoplasm of descending colon: Secondary | ICD-10-CM | POA: Diagnosis not present

## 2020-12-23 MED ORDER — SODIUM CHLORIDE 0.9 % IV SOLN: 500.0000 mL | Freq: Once | INTRAVENOUS | Status: DC

## 2020-12-23 NOTE — Op Note (Signed)
[name] Patient Name: Courtney Joseph Procedure Date: 12/23/2020 2:58 PM MRN: [id number] Endoscopist: Docia Chuck. Henrene Pastor , MD Age: 45 Referring MD:  Date of Birth: 1975-10-28 Gender: Female Account #: [id number] Procedure:                Colonoscopy with cold snare polypectomy x 1 Indications:              Positive Cologuard test Medicines:                Monitored Anesthesia Care Procedure:                Pre-Anesthesia Assessment:                           - Prior to the procedure, a History and Physical                            was performed, and patient medications and                            allergies were reviewed. The patient's tolerance of                            previous anesthesia was also reviewed. The risks                            and benefits of the procedure and the sedation                            options and risks were discussed with the patient.                            All questions were answered, and informed consent                            was obtained. Prior Anticoagulants: The patient has                            taken no previous anticoagulant or antiplatelet                            agents. ASA Grade Assessment: II - A patient with                            mild systemic disease. After reviewing the risks                            and benefits, the patient was deemed in                            satisfactory condition to undergo the procedure.                           After obtaining informed consent, the colonoscope  was passed under direct vision. Throughout the                            procedure, the patient's blood pressure, pulse, and                            oxygen saturations were monitored continuously. The                            CF [id number] #[id number] was introduced through the anus                            and advanced to the the cecum, identified by                             appendiceal orifice and ileocecal valve. The                            ileocecal valve, appendiceal orifice, and rectum                            were photographed. The quality of the bowel                            preparation was excellent. The colonoscopy was                            performed without difficulty. The patient tolerated                            the procedure well. The bowel preparation used was                            SUPREP via split dose instruction. Scope In: 3:09:10 PM Scope Out: 3:23:19 PM Scope Withdrawal Time: 0 hours 10 minutes 18 seconds  Total Procedure Duration: 0 hours 14 minutes 9 seconds  Findings:                 A 5 mm polyp was found in the descending colon. The                            polyp was removed with a cold snare. Resection and                            retrieval were complete.                           Multiple small and large-mouthed diverticula were                            found in the transverse colon and left colon.                           Internal hemorrhoids were found during retroflexion.  The exam was otherwise without abnormality on                            direct and retroflexion views. Complications:            No immediate complications. Estimated blood loss:                            None. Estimated Blood Loss:     Estimated blood loss: none. Impression:               - One 5 mm polyp in the descending colon, removed                            with a cold snare. Resected and retrieved.                           - Diverticulosis in the transverse colon and in the                            left colon.                           - Internal hemorrhoids.                           - The examination was otherwise normal on direct                            and retroflexion views. Recommendation:           - Repeat colonoscopy in 7-10 years for surveillance.                           -  Patient has a contact number available for                            emergencies. The signs and symptoms of potential                            delayed complications were discussed with the                            patient. Return to normal activities tomorrow.                            Written discharge instructions were provided to the                            patient.                           - Resume previous diet.                           - Continue present medications.                           -  Await pathology results. Docia Chuck. Henrene Pastor, MD 12/23/2020 3:27:51 PM This report has been signed electronically.

## 2020-12-23 NOTE — Patient Instructions (Signed)
YOU HAD AN ENDOSCOPIC PROCEDURE TODAY: Refer to the procedure report and other information in the discharge instructions given to you for any specific questions about what was found during the examination. If this information does not answer your questions, please call [facility] office at [phone] to clarify.  ° °YOU SHOULD EXPECT: Some feelings of bloating in the abdomen. Passage of more gas than usual. Walking can help get rid of the air that was put into your GI tract during the procedure and reduce the bloating. If you had a lower endoscopy (such as a colonoscopy or flexible sigmoidoscopy) you may notice spotting of blood in your stool or on the toilet paper. Some abdominal soreness may be present for a day or two, also. ° °DIET: Your first meal following the procedure should be a light meal and then it is ok to progress to your normal diet. A half-sandwich or bowl of soup is an example of a good first meal. Heavy or fried foods are harder to digest and may make you feel nauseous or bloated. Drink plenty of fluids but you should avoid alcoholic beverages for 24 hours. If you had a esophageal dilation, please see attached instructions for diet.   ° °ACTIVITY: Your care partner should take you home directly after the procedure. You should plan to take it easy, moving slowly for the rest of the day. You can resume normal activity the day after the procedure however YOU SHOULD NOT DRIVE, use power tools, machinery or perform tasks that involve climbing or major physical exertion for 24 hours (because of the sedation medicines used during the test).  ° °SYMPTOMS TO REPORT IMMEDIATELY: °A gastroenterologist can be reached at any hour. Please call [phone]  for any of the following symptoms:  °Following lower endoscopy (colonoscopy, flexible sigmoidoscopy) °Excessive amounts of blood in the stool  °Significant tenderness, worsening of abdominal pains  °Swelling of the abdomen that is new, acute  °Fever of 100° or  higher  °Following upper endoscopy (EGD, EUS, ERCP, esophageal dilation) °Vomiting of blood or coffee ground material  °New, significant abdominal pain  °New, significant chest pain or pain under the shoulder blades  °Painful or persistently difficult swallowing  °New shortness of breath  °Black, tarry-looking or red, bloody stools ° °FOLLOW UP:  °If any biopsies were taken you will be contacted by phone or by letter within the next 1-3 weeks. Call [phone]  if you have not heard about the biopsies in 3 weeks.  °Please also call with any specific questions about appointments or follow up tests. ° °

## 2020-12-23 NOTE — Progress Notes (Signed)
Vitals-  Pt's states no medical or surgical changes since previsit or office visit.

## 2020-12-23 NOTE — Telephone Encounter (Signed)
[facility]  received a request from your provider for coverage of Aimovig 64m/mL ([id number]). As long as you remain covered by the [facility] there are no changes to your plan benefits, this request is approved for the following time period: 12/08/2020 - 12/08/2021 Approvals may be subject to dosing limits in accordance with FDA approved labeling, evidence-based practice guidelines or your prescription drug plan benefits. If you have not already done so, you may ask your pharmacist to fill the prescription. If you have questions, please call Customer Care toll-free at the number on your [facility] card toll-free at [phone] Sincerely, [facility] cc: Dr. AViviann Spare

## 2020-12-23 NOTE — Progress Notes (Signed)
Sedate, gd SR, tolerated procedure well, VSS, report to RN

## 2020-12-23 NOTE — Progress Notes (Signed)
HISTORY OF PRESENT ILLNESS:  Courtney Joseph is a 45 y.o. female who presents today for colonoscopy after performing Cologuard testing which returned positive.  She has completed her prep.  No complaints  REVIEW OF SYSTEMS:  All non-GI ROS negative. Past Medical History:  Diagnosis Date   Anxiety    on meds   Bone spur    Broken ankle 03/2018   Chronic kidney disease    h/o kidney stones   Depression    on meds   Fatty liver    Gastric erosions    Headache    migraines   Seasonal allergies    Vaginal Pap smear, abnormal     Past Surgical History:  Procedure Laterality Date   BONE EXCISION Left 10/30/2015   Procedure: EXCISION OF LEFT CALCANEOUS BONE SPUR AT ANTERIOR PROCESS;  Surgeon: Wylene Simmer, MD;  Location: [place];  Service: Orthopedics;  Laterality: Left;   LASER ABLATION/CAUTERIZATION OF ENDOMETRIAL IMPLANTS     due to heavy bleeding   WISDOM TOOTH EXTRACTION      Social History Courtney Joseph  reports that she quit smoking about 9 years ago. Her smoking use included cigarettes. She has never used smokeless tobacco. She reports that she does not currently use alcohol. She reports that she does not use drugs.  family history includes Cancer in her maternal aunt and paternal grandmother; Colon polyps (age of onset: 39) in her father; Depression in her brother, father, and paternal grandmother; Diabetes in her paternal grandmother and paternal uncle; Early death in her maternal grandfather; Heart disease in her maternal grandfather and paternal grandfather; Hypertension in her father and mother; Learning disabilities in her brother.  Allergies  Allergen Reactions   Sulfasalazine Hives   Erythromycin Hives   Keflex [Cephalexin] Hives   Nsaids     Has gastric ulcer   Penicillins Hives   Sulfa Antibiotics Hives   Tolmetin     Has gastric ulcer       PHYSICAL EXAMINATION:  Vital signs: BP (!) 153/96 (BP Location: Right Arm, Patient Position:  Sitting, Cuff Size: Normal)   Pulse 76   Temp 98.2 F (36.8 C) (Temporal)   Ht 5' 7"  (1.702 m)   Wt 198 lb (89.8 kg)   SpO2 98%   BMI 31.01 kg/m  General: Well-developed, well-nourished, no acute distress HEENT: Sclerae are anicteric, conjunctiva pink. Oral mucosa intact Lungs: Clear Heart: Regular Abdomen: soft, nontender, nondistended, no obvious ascites, no peritoneal signs, normal bowel sounds. No organomegaly. Extremities: No edema Psychiatric: alert and oriented x3. Cooperative     ASSESSMENT:  1.  Positive Cologuard   PLAN:   1.  Colonoscopy

## 2020-12-23 NOTE — Progress Notes (Signed)
Called to room to assist during endoscopic procedure.  Patient ID and intended procedure confirmed with present staff. Received instructions for my participation in the procedure from the performing physician.  

## 2020-12-25 ENCOUNTER — Telehealth: Payer: Self-pay | Admitting: *Deleted

## 2020-12-25 ENCOUNTER — Telehealth: Payer: Self-pay

## 2020-12-25 NOTE — Telephone Encounter (Signed)
  Follow up Call-  Call back number 12/23/2020  Post procedure Call Back phone  # [phone]  Permission to leave phone message Yes  Some recent data might be hidden     Patient questions:  Do you have a fever, pain , or abdominal swelling? No. Pain Score  0 *  Have you tolerated food without any problems? Yes.    Have you been able to return to your normal activities? Yes.    Do you have any questions about your discharge instructions: Diet   No. Medications  No. Follow up visit  No.  Do you have questions or concerns about your Care? No.  Actions: * If pain score is 4 or above: No action needed, pain <4.  Have you developed a fever since your procedure? no  2.   Have you had an respiratory symptoms (SOB or cough) since your procedure? no  3.   Have you tested positive for COVID 19 since your procedure no  4.   Have you had any family members/close contacts diagnosed with the COVID 19 since your procedure?  no   If yes to any of these questions please route to Joylene John, RN and Joella Prince, RN

## 2020-12-25 NOTE — Telephone Encounter (Signed)
  Follow up Call-  Call back number 12/23/2020  Post procedure Call Back phone  # [phone]  Permission to leave phone message Yes  Some recent data might be hidden     Patient questions:  Do you have a fever, pain , or abdominal swelling? No. Pain Score  0 *  Have you tolerated food without any problems? Yes.    Have you been able to return to your normal activities? Yes.    Do you have any questions about your discharge instructions: Diet   No. Medications  No. Follow up visit  No.  Do you have questions or concerns about your Care? No.  Actions: * If pain score is 4 or above: No action needed, pain <4.  Have you developed a fever since your procedure? no  2.   Have you had an respiratory symptoms (SOB or cough) since your procedure? no  3.   Have you tested positive for COVID 19 since your procedure no  4.   Have you had any family members/close contacts diagnosed with the COVID 19 since your procedure?  no   If yes to any of these questions please route to Joylene John, RN and Joella Prince, RN

## 2020-12-30 ENCOUNTER — Encounter: Payer: Self-pay | Admitting: Internal Medicine

## 2020-12-31 ENCOUNTER — Ambulatory Visit (INDEPENDENT_AMBULATORY_CARE_PROVIDER_SITE_OTHER): Payer: BC Managed Care – PPO | Admitting: Neurology

## 2020-12-31 ENCOUNTER — Encounter: Payer: Self-pay | Admitting: Neurology

## 2020-12-31 VITALS — BP 142/82 | HR 64

## 2020-12-31 DIAGNOSIS — R519 Headache, unspecified: Secondary | ICD-10-CM

## 2020-12-31 DIAGNOSIS — E669 Obesity, unspecified: Secondary | ICD-10-CM

## 2020-12-31 DIAGNOSIS — Z9189 Other specified personal risk factors, not elsewhere classified: Secondary | ICD-10-CM | POA: Diagnosis not present

## 2020-12-31 DIAGNOSIS — R0683 Snoring: Secondary | ICD-10-CM

## 2020-12-31 DIAGNOSIS — Z820 Family history of epilepsy and other diseases of the nervous system: Secondary | ICD-10-CM

## 2020-12-31 DIAGNOSIS — G4719 Other hypersomnia: Secondary | ICD-10-CM

## 2020-12-31 DIAGNOSIS — G43719 Chronic migraine without aura, intractable, without status migrainosus: Secondary | ICD-10-CM

## 2020-12-31 NOTE — Patient Instructions (Signed)
It was nice to meet you today!   Here is what we discussed today and what we came up with as our plan for you:    For your chronic migraines, I would like for you to continue with the Aimovig injections.  We may consider switching you to Ajovy in the near future and we may also consider you for Botox injections which is often a very good choice for patients who have tried and failed multiple medications.  Please read out on the Botox injections in the pamphlet.  We will do a brain scan, called MRI and call you with the test results. We will have to schedule you for this on a separate date. This test requires authorization from your insurance, and we will take care of the insurance process.  Based on your symptoms and your exam I believe you are at risk for obstructive sleep apnea (aka OSA), and I think we should proceed with a sleep study to determine whether you do or do not have OSA and how severe it is. Even, if you have mild OSA, I may want you to consider treatment with CPAP, as treatment of even borderline or mild sleep apnea can result and improvement of symptoms such as sleep disruption, daytime sleepiness, nighttime bathroom breaks, restless leg symptoms, improvement of headache syndromes, even improved mood disorder.   As explained, an attended sleep study meaning you get to stay overnight in the sleep lab, lets [place] monitor sleep-related behaviors such as sleep talking and leg movements in sleep, in addition to monitoring for sleep apnea.  A home sleep test is a screening tool for sleep apnea only, and unfortunately does not help with any other sleep-related diagnoses.  Please remember, the long-term risks and ramifications of untreated moderate to severe obstructive sleep apnea are: increased Cardiovascular disease, including congestive heart failure, stroke, difficult to control hypertension, treatment resistant obesity, arrhythmias, especially irregular heartbeat commonly known as A. Fib.  (atrial fibrillation); even type 2 diabetes has been linked to untreated OSA.   Sleep apnea can cause disruption of sleep and sleep deprivation in most cases, which, in turn, can cause recurrent headaches, problems with memory, mood, concentration, focus, and vigilance. Most people with untreated sleep apnea report excessive daytime sleepiness, which can affect their ability to drive. Please do not drive if you feel sleepy. Patients with sleep apnea can also develop difficulty initiating and maintaining sleep (aka insomnia).   Having sleep apnea may increase your risk for other sleep disorders, including involuntary behaviors sleep such as sleep terrors, sleep talking, sleepwalking.    Having sleep apnea can also increase your risk for restless leg syndrome and leg movements at night.   Please note that untreated obstructive sleep apnea may carry additional perioperative morbidity. Patients with significant obstructive sleep apnea (typically, in the moderate to severe degree) should receive, if possible, perioperative PAP (positive airway pressure) therapy and the surgeons and particularly the anesthesiologists should be informed of the diagnosis and the severity of the sleep disordered breathing.   I will likely see you back after your sleep study to go over the test results and where to go from there. We will call you after your sleep study to advise about the results (most likely, you will hear from [facility], my nurse) and to set up an appointment at the time, as necessary.    Our sleep lab administrative assistant will call you to schedule your sleep study and give you further instructions, regarding the check in  process for the sleep study, arrival time, what to bring, when you can expect to leave after the study, etc., and to answer any other logistical questions you may have. If you don't hear back from her by about 2 weeks from now, please feel free to call her direct line at [phone] or you can  call our general clinic number, or email [place] through My Chart.

## 2020-12-31 NOTE — Progress Notes (Signed)
Subjective:    Patient ID: Courtney Joseph is a 45 y.o. female.  HPI    Star Age, MD, PhD [facility] [address], [address] [address] [place], [place] [address]  Dear Dr. Lajuana Ripple,  I saw your patient, Courtney Joseph, upon your kind request in my sleep clinic today for initial consultation of her sleep disturbance, in particular, concern for underlying obstructive sleep apnea.  The patient is unaccompanied today.  As you know, Courtney Joseph is a 45 year old right-handed woman with an underlying medical history of chronic migraines, anxiety, depression, fatty liver, seasonal allergies, and mild obesity, who reports snoring and excessive daytime somnolence, difficulty maintaining sleep for years.  Has tried melatonin in the past but not recently.  She has not been on any prescription medication for sleep but Belsomra or Rozerem has been considered recently.  She has chronic migraines and we also talked about her migraine headaches to some degree today.  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  She has been on Imitrex as needed for abortive treatment.  She has left-sided migraines, throbbing, associated with photophobia and nausea and sometimes vomiting.  She has been on nausea medication in the past.  She has periorbital pain around the left eye.  She has never had a brain scan, denies any sudden onset of one-sided weakness or numbness or tingling or droopy face or slurring of speech.  She does drink caffeine in the form of soda, 1 or 2/day and tea which is decaf.  She tries to hydrate better with water, she is trying to lose weight, weight has plateaued.  She has occasionally woken up with a headache, bedtime is between 9 and 10 PM and rise time between  6:15 AM and 7 AM.  She is divorced, she lives alone, her daughter is a Equities trader at [employer].  She has quite a bit of stress at work.  She also worries about her elderly parents.  She works at [employer], administrative position.  She quit smoking some 9 years ago.  She does not wake up rested.  Epworth sleepiness score is 10 out of 24, fatigue severity score is 38 out of 63.  I reviewed your office note from 10/15/2020.  Her father has sleep apnea and has been on a BiPAP machine before.  His Past Medical History Is Significant For: Past Medical History:  Diagnosis Date   Anxiety    on meds   Bone spur    Broken ankle 03/2018   Chronic kidney disease    h/o kidney stones   Depression    on meds   Fatty liver    Gastric erosions    Headache    migraines   Seasonal allergies    Vaginal Pap smear, abnormal     His Past Surgical History Is Significant For: Past Surgical History:  Procedure Laterality Date   BONE EXCISION Left 10/30/2015   Procedure: EXCISION OF LEFT CALCANEOUS BONE SPUR AT ANTERIOR PROCESS;  Surgeon: Wylene Simmer, MD;  Location: [facility];  Service: Orthopedics;  Laterality: Left;   LASER ABLATION/CAUTERIZATION OF ENDOMETRIAL IMPLANTS     due to heavy bleeding   WISDOM TOOTH EXTRACTION      His Family History Is Significant  For: Family History  Problem Relation Age of Onset   Hypertension Mother    Colon polyps Father 20   Depression Father    Hypertension Father    Depression Brother    Learning disabilities Brother    Cancer Maternal Aunt    Diabetes Paternal Uncle    Early death Maternal Grandfather    Heart disease Maternal Grandfather    Cancer Paternal Grandmother    Depression Paternal Grandmother    Diabetes Paternal Grandmother    Heart disease Paternal Grandfather    Colon cancer Neg Hx    Esophageal cancer Neg Hx    Prostate cancer Neg Hx    Rectal cancer Neg Hx    Stomach cancer Neg Hx    Sleep apnea Neg Hx      His Social History Is Significant For: Social History   Socioeconomic History   Marital status: Divorced    Spouse name: Not on file   Number of children: Not on file   Years of education: Not on file   Highest education level: Not on file  Occupational History   Occupation: staff account  Tobacco Use   Smoking status: Former    Types: Cigarettes    Quit date: 10/15/2011    Years since quitting: 9.2   Smokeless tobacco: Never  Vaping Use   Vaping Use: Former  Substance and Sexual Activity   Alcohol use: Not Currently    Comment: Very RARE   Drug use: No   Sexual activity: Yes    Birth control/protection: Surgical    Comment: ablation  Other Topics Concern   Not on file  Social History Narrative   Not on file   Social Determinants of Health   Financial Resource Strain: Low Risk    Difficulty of Paying Living Expenses: Not hard at all  Food Insecurity: No Food Insecurity   Worried About Charity fundraiser in the Last Year: Never true   Ran Out of Food in the Last Year: Never true  Transportation Needs: No Transportation Needs   Lack of Transportation (Medical): No   Lack of Transportation (Non-Medical): No  Physical Activity: Inactive   Days of Exercise per Week: 0 days   Minutes of Exercise per Session: 0 min  Stress: Stress Concern Present   Feeling of Stress : Very much  Social Connections: Moderately Isolated   Frequency of Communication with Friends and Family: More than three times a week   Frequency of Social Gatherings with Friends and Family: More than three times a week   Attends Religious Services: Never   Marine scientist or Organizations: Yes   Attends Music therapist: 1 to 4 times per year   Marital Status: Divorced    His Allergies Are:  Allergies  Allergen Reactions   Sulfasalazine Hives   Erythromycin Hives   Keflex [Cephalexin] Hives   Nsaids     Has gastric ulcer   Penicillins Hives   Sulfa Antibiotics Hives    Tolmetin     Has gastric ulcer  :   His Current Medications Are:  Outpatient Encounter Medications as of 12/31/2020  Medication Sig   acetaminophen-codeine (TYLENOL #3) 300-30 MG tablet Take 1 tablet by mouth every 6 (six) hours as needed for severe pain.   AIMOVIG 70 MG/ML SOAJ Inject sub-q once monthly.   buPROPion (WELLBUTRIN XL) 150 MG 24 hr tablet Take 1 tablet (150 mg total) by mouth daily.   cetirizine (ZYRTEC) 10 MG  tablet Take 1 tablet by mouth once daily   escitalopram (LEXAPRO) 20 MG tablet Take 1 tablet (20 mg total) by mouth daily.   estradiol (ESTRACE) 2 MG tablet Take 1 tablet (2 mg total) by mouth daily.   fluticasone (FLONASE) 50 MCG/ACT nasal spray Place 2 sprays into both nostrils daily. (Patient taking differently: Place 2 sprays into both nostrils daily as needed.)   hyoscyamine (LEVSIN SL) 0.125 MG SL tablet Place 1 tablet (0.125 mg total) under the tongue every 6 (six) hours as needed.   progesterone (PROMETRIUM) 200 MG capsule Take 1 at bedtime (Patient taking differently: Take 200 mg by mouth daily. Take 1 at bedtime)   SUMAtriptan (IMITREX) 100 MG tablet Take 1 tablet (100 mg total) by mouth every 2 (two) hours as needed for migraine. May repeat in 2 hours if headache persists or recurs. (Patient taking differently: Take 100 mg by mouth as needed for migraine. May repeat in 2 hours if headache persists or recurs.)   Vitamin D, Ergocalciferol, (DRISDOL) 1.25 MG (50000 UNIT) CAPS capsule Take 1 capsule (50,000 Units total) by mouth every 7 (seven) days.   Galcanezumab-gnlm (EMGALITY) 120 MG/ML SOSY Inject 1 Dose into the skin every 30 (thirty) days.   No facility-administered encounter medications on file as of 12/31/2020.  :   Review of Systems:  Out of a complete 14 point review of systems, all are reviewed and negative with the exception of these symptoms as listed below:  Review of Systems  Neurological:        Pt is here for sleep consult . Pt states she  snores, has headaches, and fatigue throughout the day. Pt denies hypertension, sleep study and CPAP machine  Pt states she is having a headache now and her level is 8  ESS:10 FSS: 38   Objective:  Neurological Exam  Physical Exam Physical Examination:   Vitals:   12/31/20 1500  BP: (!) 142/82  Pulse: 64   General Examination: The patient is a very pleasant 45 y.o. female in no acute distress. She appears well-developed and well-nourished and well groomed.   HEENT: Normocephalic, atraumatic, pupils are equal, round and reactive to light, extraocular tracking is good without limitation to gaze excursion or nystagmus noted. Hearing is grossly intact. Face is symmetric with normal facial animation. Speech is clear with no dysarthria noted. There is no hypophonia. There is no lip, neck/head, jaw or voice tremor. Neck is supple with full range of passive and active motion. There are no carotid bruits on auscultation. Oropharynx exam reveals: mild mouth dryness, good dental hygiene and moderate airway crowding, due to small airway entry, prominent uvula, enlarged tonsils of 2-3+ bilaterally.  Mallampati class II, minimal overbite noted.  Neck circumference of 15-7/8 inches.  Tongue protrudes centrally and palate elevates symmetrically.  Chest: Clear to auscultation without wheezing, rhonchi or crackles noted.  Heart: S1+S2+0, regular and normal without murmurs, rubs or gallops noted.   Abdomen: Soft, non-tender and non-distended with normal bowel sounds appreciated on auscultation.  Extremities: There is no pitting edema in the distal lower extremities bilaterally.   Skin: Warm and dry without trophic changes noted.   Musculoskeletal: exam reveals no obvious joint deformities, tenderness or joint swelling or erythema.   Neurologically:  Mental status: The patient is awake, alert and oriented in all 4 spheres. Her immediate and remote memory, attention, language skills and fund of knowledge  are appropriate. There is no evidence of aphasia, agnosia, apraxia or anomia. Speech is  clear with normal prosody and enunciation. Thought process is linear. Mood is normal and affect is normal.  Cranial nerves II - XII are as described above under HEENT exam.  Motor exam: Normal bulk, strength and tone is noted. There is no tremor, reflexes are 1-2+ throughout. Fine motor skills and coordination: grossly intact.  Cerebellar testing: No dysmetria or intention tremor. There is no truncal or gait ataxia.  Sensory exam: intact to light touch in the upper and lower extremities.  Gait, station and balance: She stands easily. No veering to one side is noted. No leaning to one side is noted. Posture is age-appropriate and stance is narrow based. Gait shows normal stride length and normal pace. No problems turning are noted.   Assessment and Plan:  In summary, Courtney Joseph is a very pleasant 45 y.o.-year old female with an underlying medical history of chronic migraines, anxiety, depression, fatty liver, seasonal allergies, and mild obesity, whose history and physical exam are concerning for obstructive sleep apnea (OSA). As far as her migraines, she has tried and failed Emgality injections monthly injections.  She still has breakthrough migraines.  She was recently started on Wellbutrin which has helped her stress and anxiety as well as depression symptoms.  She has been on Lexapro.  She has been on Imitrex for immediate/abortive treatment of her migraines.  She had recent blood work on 10/15/2020.  She had a low normal vitamin B12 and I advised her to start oral vitamin B12 supplementation with 1000 mcg daily.  In addition, I suggest that she start taking over-the-counter magnesium and riboflavin which can help migraines.  She has been recently started on vitamin D prescription due to low normal vitamin D level.  We will proceed with a brain MRI with and without contrast to rule out a structural cause of her  migraines.  She has noticed an increase in her migraine intensity and frequency in the past few months.  She has never had a brain scan.  She will be called to schedule this once we have insurance authorization.  I also discussed the diagnosis of OSA, its prognosis and treatment options. We talked about medical treatments, surgical interventions and non-pharmacological approaches. I explained in particular the risks and ramifications of untreated moderate to severe OSA, especially with respect to developing cardiovascular disease down the Road, including congestive heart failure, difficult to treat hypertension, cardiac arrhythmias, or stroke. Even type 2 diabetes has, in part, been linked to untreated OSA. Symptoms of untreated OSA include daytime sleepiness, memory problems, mood irritability and mood disorder such as depression and anxiety, lack of energy, as well as recurrent headaches, especially morning headaches. We talked about trying to maintain a healthy lifestyle in general, as well as the importance of weight control. We also talked about the importance of good sleep hygiene. I recommended the following at this time: sleep study.  I outlined the difference between a laboratory attended sleep study versus home sleep test.  I showed her a model for CPAP machine.  We also had information about Botox injections which I provided for her to review.  We may consider Botox injections for chronic intractable migraines in the near future. I explained the sleep test procedure to the patient and also outlined possible surgical and non-surgical treatment options of OSA, including the use of a custom-made dental device (which would require a referral to a specialist dentist or oral surgeon), upper airway surgical options, such as traditional UPPP or a novel less invasive surgical  option in the form of Inspire hypoglossal nerve stimulation (which would involve a referral to an ENT surgeon). I also explained the CPAP  treatment option to the patient, who indicated that she would be willing to try CPAP if the need arises. I explained the importance of being compliant with PAP treatment, not only for insurance purposes but primarily to improve Her symptoms, and for the patient's long term health benefit, including to reduce Her cardiovascular risks. We will plan a follow-up after testing.  We will call her with her MRI and sleep study results in the interim as well.  I answered all her questions today and she was in agreement with our plan.  Thank you very much for allowing me to participate in the care of this nice patient. If I can be of any further assistance to you please do not hesitate to call me at [phone].  Sincerely,   Star Age, MD, PhD

## 2021-01-05 ENCOUNTER — Telehealth: Payer: Self-pay | Admitting: Neurology

## 2021-01-05 NOTE — Telephone Encounter (Signed)
LVM for pt to call me back to schedule sleep study

## 2021-01-06 ENCOUNTER — Other Ambulatory Visit: Payer: Self-pay | Admitting: Family Medicine

## 2021-01-06 DIAGNOSIS — F339 Major depressive disorder, recurrent, unspecified: Secondary | ICD-10-CM

## 2021-01-08 ENCOUNTER — Other Ambulatory Visit: Payer: Self-pay | Admitting: Nurse Practitioner

## 2021-01-08 DIAGNOSIS — F339 Major depressive disorder, recurrent, unspecified: Secondary | ICD-10-CM

## 2021-01-08 MED ORDER — BUPROPION HCL ER (XL) 150 MG PO TB24: 150.0000 mg | ORAL_TABLET | Freq: Every day | ORAL | 0 refills | Status: DC

## 2021-01-14 ENCOUNTER — Telehealth: Payer: Self-pay | Admitting: Neurology

## 2021-01-14 NOTE — Telephone Encounter (Signed)
MR Brain w/wo contrast Dr. Reece Leader Josem Kaufmann: [id number] (exp. 01/14/21 to 02/12/21). Patient is scheduled at [facility] for 01/28/21.

## 2021-01-15 ENCOUNTER — Ambulatory Visit: Payer: BC Managed Care – PPO | Admitting: Family Medicine

## 2021-01-15 ENCOUNTER — Encounter: Payer: Self-pay | Admitting: Family Medicine

## 2021-01-15 VITALS — BP 138/89 | HR 73 | Temp 98.6°F | Ht 67.0 in | Wt 201.0 lb

## 2021-01-15 DIAGNOSIS — H66001 Acute suppurative otitis media without spontaneous rupture of ear drum, right ear: Secondary | ICD-10-CM

## 2021-01-15 MED ORDER — CEFDINIR 300 MG PO CAPS
300.0000 mg | ORAL_CAPSULE | Freq: Two times a day (BID) | ORAL | 0 refills | Status: DC
Start: 2021-01-15 — End: 2021-02-16

## 2021-01-15 NOTE — Progress Notes (Signed)
Subjective:  Patient ID: Courtney Joseph, female    DOB: 10-18-1975, 45 y.o.   MRN: [id number]  Patient Care Team: Janora Norlander, DO as PCP - General (Family Medicine)   Chief Complaint:  Ear Pain (Congestion, swelling, epistaxis)   HPI: Courtney Joseph is a 45 y.o. female presenting on 01/15/2021 for Ear Pain (Congestion, swelling, epistaxis)   Patient presents today with complaints of right ear pain.  States this started 3 to 4 days ago and continues to worsen.  She has been using Tylenol without relief of symptoms.  She has not measured a temperature.  Denies any congestion, cough, malaise, or fatigue.  No loss of hearing.    Relevant past medical, surgical, family, and social history reviewed and updated as indicated.  Allergies and medications reviewed and updated. Data reviewed: Chart in Epic.   Past Medical History:  Diagnosis Date   Anxiety    on meds   Bone spur    Broken ankle 03/2018   Chronic kidney disease    h/o kidney stones   Depression    on meds   Fatty liver    Gastric erosions    Headache    migraines   Seasonal allergies    Vaginal Pap smear, abnormal     Past Surgical History:  Procedure Laterality Date   BONE EXCISION Left 10/30/2015   Procedure: EXCISION OF LEFT CALCANEOUS BONE SPUR AT ANTERIOR PROCESS;  Surgeon: Wylene Simmer, MD;  Location: [facility];  Service: Orthopedics;  Laterality: Left;   LASER ABLATION/CAUTERIZATION OF ENDOMETRIAL IMPLANTS     due to heavy bleeding   WISDOM TOOTH EXTRACTION      Social History   Socioeconomic History   Marital status: Divorced    Spouse name: Not on file   Number of children: Not on file   Years of education: Not on file   Highest education level: Not on file  Occupational History   Occupation: staff account  Tobacco Use   Smoking status: Former    Types: Cigarettes    Quit date: 10/15/2011    Years since quitting: 9.2   Smokeless tobacco: Never  Vaping Use   Vaping  Use: Former  Substance and Sexual Activity   Alcohol use: Not Currently    Comment: Very RARE   Drug use: No   Sexual activity: Yes    Birth control/protection: Surgical    Comment: ablation  Other Topics Concern   Not on file  Social History Narrative   Not on file   Social Determinants of Health   Financial Resource Strain: Low Risk    Difficulty of Paying Living Expenses: Not hard at all  Food Insecurity: No Food Insecurity   Worried About Charity fundraiser in the Last Year: Never true   [place] in the Last Year: Never true  Transportation Needs: No Transportation Needs   Lack of Transportation (Medical): No   Lack of Transportation (Non-Medical): No  Physical Activity: Inactive   Days of Exercise per Week: 0 days   Minutes of Exercise per Session: 0 min  Stress: Stress Concern Present   Feeling of Stress : Very much  Social Connections: Moderately Isolated   Frequency of Communication with Friends and Family: More than three times a week   Frequency of Social Gatherings with Friends and Family: More than three times a week   Attends Religious Services: Never   Marine scientist or Organizations: Yes  Attends Archivist Meetings: 1 to 4 times per year   Marital Status: Divorced  Human resources officer Violence: Not At Risk   Fear of Current or Ex-Partner: No   Emotionally Abused: No   Physically Abused: No   Sexually Abused: No    Outpatient Encounter Medications as of 01/15/2021  Medication Sig   acetaminophen-codeine (TYLENOL #3) 300-30 MG tablet Take 1 tablet by mouth every 6 (six) hours as needed for severe pain.   AIMOVIG 70 MG/ML SOAJ Inject sub-q once monthly.   buPROPion (WELLBUTRIN XL) 150 MG 24 hr tablet Take 1 tablet (150 mg total) by mouth daily.   cefdinir (OMNICEF) 300 MG capsule Take 1 capsule (300 mg total) by mouth 2 (two) times daily. 1 po BID   cetirizine (ZYRTEC) 10 MG tablet Take 1 tablet by mouth once daily   escitalopram  (LEXAPRO) 20 MG tablet Take 1 tablet (20 mg total) by mouth daily.   estradiol (ESTRACE) 2 MG tablet Take 1 tablet (2 mg total) by mouth daily.   fluticasone (FLONASE) 50 MCG/ACT nasal spray Place 2 sprays into both nostrils daily. (Patient taking differently: Place 2 sprays into both nostrils daily as needed.)   hyoscyamine (LEVSIN SL) 0.125 MG SL tablet Place 1 tablet (0.125 mg total) under the tongue every 6 (six) hours as needed.   progesterone (PROMETRIUM) 200 MG capsule Take 1 at bedtime (Patient taking differently: Take 200 mg by mouth daily. Take 1 at bedtime)   Vitamin D, Ergocalciferol, (DRISDOL) 1.25 MG (50000 UNIT) CAPS capsule Take 1 capsule (50,000 Units total) by mouth every 7 (seven) days.   PLENVU 140 g SOLR See admin instructions.   SUMAtriptan (IMITREX) 100 MG tablet Take 1 tablet (100 mg total) by mouth every 2 (two) hours as needed for migraine. May repeat in 2 hours if headache persists or recurs. (Patient not taking: Reported on 01/15/2021)   [DISCONTINUED] Galcanezumab-gnlm (EMGALITY) 120 MG/ML SOSY Inject 1 Dose into the skin every 30 (thirty) days.   No facility-administered encounter medications on file as of 01/15/2021.    Allergies  Allergen Reactions   Sulfasalazine Hives   Erythromycin Hives   Keflex [Cephalexin] Hives   Nsaids     Has gastric ulcer   Penicillins Hives   Sulfa Antibiotics Hives   Tolmetin     Has gastric ulcer    Review of Systems  Constitutional:  Negative for activity change, appetite change, chills, diaphoresis, fatigue, fever and unexpected weight change.  HENT:  Positive for ear pain and rhinorrhea. Negative for congestion, dental problem, drooling, ear discharge, facial swelling, hearing loss, mouth sores, nosebleeds, postnasal drip, sinus pressure, sinus pain, sneezing, sore throat, tinnitus, trouble swallowing and voice change.   Genitourinary:  Negative for decreased urine volume and difficulty urinating.  Neurological:  Negative  for dizziness, tremors, seizures, syncope, facial asymmetry, speech difficulty, weakness, light-headedness, numbness and headaches.  Psychiatric/Behavioral:  Negative for confusion.   All other systems reviewed and are negative.      Objective:  BP 138/89    Pulse 73    Temp 98.6 F (37 C)    Ht _0  (1.702 m)    Wt 201 lb (91.2 kg)    SpO2 97%    BMI 31.48 kg/m    Wt Readings from Last 3 Encounters:  01/15/21 201 lb (91.2 kg)  12/23/20 198 lb (89.8 kg)  12/08/20 198 lb (89.8 kg)    Physical Exam Vitals and nursing note reviewed.  Constitutional:  General: She is not in acute distress.    Appearance: Normal appearance. She is obese. She is not ill-appearing, toxic-appearing or diaphoretic.  HENT:     Head: Normocephalic and atraumatic.     Right Ear: Hearing, ear canal and external ear normal. Tympanic membrane is erythematous and bulging. Tympanic membrane is not perforated.     Left Ear: Hearing, tympanic membrane, ear canal and external ear normal.     Nose: Congestion present. No rhinorrhea.     Mouth/Throat:     Mouth: Mucous membranes are moist.     Pharynx: No pharyngeal swelling, oropharyngeal exudate, posterior oropharyngeal erythema or uvula swelling.     Tonsils: No tonsillar exudate. 2+ on the right. 2+ on the left.  Eyes:     Conjunctiva/sclera: Conjunctivae normal.     Pupils: Pupils are equal, round, and reactive to light.  Cardiovascular:     Rate and Rhythm: Normal rate and regular rhythm.     Heart sounds: No murmur heard.   No friction rub. No gallop.  Pulmonary:     Effort: Pulmonary effort is normal. No respiratory distress.     Breath sounds: Normal breath sounds.  Skin:    General: Skin is warm and dry.     Capillary Refill: Capillary refill takes less than 2 seconds.  Neurological:     General: No focal deficit present.     Mental Status: She is alert and oriented to person, place, and time.  Psychiatric:        Mood and Affect: Mood  normal.        Behavior: Behavior normal.        Thought Content: Thought content normal.        Judgment: Judgment normal.    Results for orders placed or performed in visit on 10/15/20  CMP14+EGFR  Result Value Ref Range   Glucose 99 70 - 99 mg/dL   BUN 11 6 - 24 mg/dL   Creatinine, Ser 0.70 0.57 - 1.00 mg/dL   eGFR 107 >59 mL/min/1.73   BUN/Creatinine Ratio 16 9 - 23   Sodium 139 134 - 144 mmol/L   Potassium 4.1 3.5 - 5.2 mmol/L   Chloride 100 96 - 106 mmol/L   CO2 22 20 - 29 mmol/L   Calcium 9.7 8.7 - 10.2 mg/dL   Total Protein 7.7 6.0 - 8.5 g/dL   Albumin 4.8 3.8 - 4.8 g/dL   Globulin, Total 2.9 1.5 - 4.5 g/dL   Albumin/Globulin Ratio 1.7 1.2 - 2.2   Bilirubin Total 0.5 0.0 - 1.2 mg/dL   Alkaline Phosphatase 81 44 - 121 IU/L   AST 23 0 - 40 IU/L   ALT 28 0 - 32 IU/L  Lipid Panel  Result Value Ref Range   Cholesterol, Total 186 100 - 199 mg/dL   Triglycerides 106 0 - 149 mg/dL   HDL 42 >39 mg/dL   VLDL Cholesterol Cal 19 5 - 40 mg/dL   LDL Chol Calc (NIH) 125 (H) 0 - 99 mg/dL   Chol/HDL Ratio 4.4 0.0 - 4.4 ratio  CBC  Result Value Ref Range   WBC 6.6 3.4 - 10.8 x10E3/uL   RBC 4.92 3.77 - 5.28 x10E6/uL   Hemoglobin 13.6 11.1 - 15.9 g/dL   Hematocrit 41.6 34.0 - 46.6 %   MCV 85 79 - 97 fL   MCH 27.6 26.6 - 33.0 pg   MCHC 32.7 31.5 - 35.7 g/dL   RDW 13.0 11.7 - 15.4 %  Platelets 345 150 - 450 x10E3/uL  VITAMIN D 25 Hydroxy (Vit-D Deficiency, Fractures)  Result Value Ref Range   Vit D, 25-Hydroxy 34.2 30.0 - 100.0 ng/mL  Drug Screen 10 W/Conf, Se  Result Value Ref Range   Amphetamines, IA Negative Cutoff:50 ng/mL   Barbiturates, IA Negative Cutoff:0.1 ug/mL   Benzodiazepines, IA Negative Cutoff:20 ng/mL   Cocaine & Metabolite, IA Negative Cutoff:25 ng/mL   Phencyclidine, IA Negative Cutoff:8 ng/mL   THC(Marijuana) Metabolite, IA Negative Cutoff:5 ng/mL   Opiates, IA ++POSITIVE++ (A) Cutoff:5 ng/mL   Oxycodones, IA Negative Cutoff:5 ng/mL   Methadone, IA  Negative Cutoff:25 ng/mL   Propoxyphene, IA Negative Cutoff:50 ng/mL  TSH  Result Value Ref Range   TSH 1.060 0.450 - 4.500 uIU/mL  Vitamin B12  Result Value Ref Range   Vitamin B-12 261 232 - 1,245 pg/mL  T4, Free  Result Value Ref Range   Free T4 1.03 0.82 - 1.77 ng/dL  Cologuard  Result Value Ref Range   Cologuard Positive (A) Negative  Opiates,MS,WB/Sp Rfx  Result Value Ref Range   Opiate Confirmation Positive    Codeine 3.3 ng/mL   Morphine Negative ng/mL   6-Acetylmorphine Negative    Hydrocodone Negative ng/mL   Hydromorphone Negative ng/mL   Dihydrocodeine Negative ng/mL  Oxycodones,MS,WB/Sp Rfx  Result Value Ref Range   Oxycodones Confirmation Negative    Oxycocone Negative ng/mL   Oxymorphone Negative ng/mL       Pertinent labs & imaging results that were available during my care of the patient were reviewed by me and considered in my medical decision making.  Assessment & Plan:  Courtney Joseph was seen today for ear pain.  Diagnoses and all orders for this visit:  Non-recurrent acute suppurative otitis media of right ear without spontaneous rupture of tympanic membrane Allergies discussed in detail and determined Cefdinir would be an adequate option based on pts history. Right TM erythematous and bulging, surprised it has not ruptured. Will treat with 10 days of Cefdinir. Symptomatic care discussed in detail. Reevaluation in 2 weeks. Report any new, worsening, or persistent symptoms.  -     cefdinir (OMNICEF) 300 MG capsule; Take 1 capsule (300 mg total) by mouth 2 (two) times daily. 1 po BID     Continue all other maintenance medications.  Follow up plan: Return in about 2 weeks (around 01/29/2021), or if symptoms worsen or fail to improve, for AOM.   Continue healthy lifestyle choices, including diet (rich in fruits, vegetables, and lean proteins, and low in salt and simple carbohydrates) and exercise (at least 30 minutes of moderate physical activity  daily).  Educational handout given for Otitis Media  The above assessment and management plan was discussed with the patient. The patient verbalized understanding of and has agreed to the management plan. Patient is aware to call the clinic if they develop any new symptoms or if symptoms persist or worsen. Patient is aware when to return to the clinic for a follow-up visit. Patient educated on when it is appropriate to go to the emergency department.   Monia Pouch, FNP-C [facility] [phone]

## 2021-01-21 ENCOUNTER — Encounter: Payer: Self-pay | Admitting: Family Medicine

## 2021-01-28 ENCOUNTER — Ambulatory Visit (INDEPENDENT_AMBULATORY_CARE_PROVIDER_SITE_OTHER): Payer: BC Managed Care – PPO

## 2021-01-28 DIAGNOSIS — R519 Headache, unspecified: Secondary | ICD-10-CM

## 2021-01-28 DIAGNOSIS — Z820 Family history of epilepsy and other diseases of the nervous system: Secondary | ICD-10-CM

## 2021-01-28 DIAGNOSIS — Z9189 Other specified personal risk factors, not elsewhere classified: Secondary | ICD-10-CM

## 2021-01-28 DIAGNOSIS — G43719 Chronic migraine without aura, intractable, without status migrainosus: Secondary | ICD-10-CM

## 2021-01-28 DIAGNOSIS — G4719 Other hypersomnia: Secondary | ICD-10-CM

## 2021-01-28 DIAGNOSIS — R0683 Snoring: Secondary | ICD-10-CM

## 2021-01-28 DIAGNOSIS — E669 Obesity, unspecified: Secondary | ICD-10-CM

## 2021-01-28 MED ORDER — GADOBENATE DIMEGLUMINE 529 MG/ML IV SOLN
20.0000 mL | Freq: Once | INTRAVENOUS | Status: AC | PRN
  Administered 2021-01-28: 20 mL via INTRAVENOUS

## 2021-02-01 ENCOUNTER — Ambulatory Visit (INDEPENDENT_AMBULATORY_CARE_PROVIDER_SITE_OTHER): Payer: BC Managed Care – PPO | Admitting: Neurology

## 2021-02-01 DIAGNOSIS — R519 Headache, unspecified: Secondary | ICD-10-CM

## 2021-02-01 DIAGNOSIS — Z9189 Other specified personal risk factors, not elsewhere classified: Secondary | ICD-10-CM

## 2021-02-01 DIAGNOSIS — Z820 Family history of epilepsy and other diseases of the nervous system: Secondary | ICD-10-CM

## 2021-02-01 DIAGNOSIS — G4733 Obstructive sleep apnea (adult) (pediatric): Secondary | ICD-10-CM

## 2021-02-01 DIAGNOSIS — G4720 Circadian rhythm sleep disorder, unspecified type: Secondary | ICD-10-CM

## 2021-02-01 DIAGNOSIS — E669 Obesity, unspecified: Secondary | ICD-10-CM

## 2021-02-01 DIAGNOSIS — R0683 Snoring: Secondary | ICD-10-CM

## 2021-02-01 DIAGNOSIS — G43719 Chronic migraine without aura, intractable, without status migrainosus: Secondary | ICD-10-CM

## 2021-02-01 DIAGNOSIS — G478 Other sleep disorders: Secondary | ICD-10-CM

## 2021-02-01 DIAGNOSIS — G4719 Other hypersomnia: Secondary | ICD-10-CM

## 2021-02-02 ENCOUNTER — Telehealth: Payer: Self-pay

## 2021-02-02 NOTE — Telephone Encounter (Signed)
-----   Message from Star Age, MD sent at 02/02/2021  7:15 AM EST ----- Please call patient and advise her that her brain MRI with and without contrast was reported as unremarkable.  We will advise her regarding her sleep study results separately.

## 2021-02-02 NOTE — Telephone Encounter (Signed)
I called patient. I advised her of the unremarkable MRI results. Pt verbalized understanding of results. Pt had no questions at this time but was encouraged to call back if questions arise.

## 2021-02-04 NOTE — Procedures (Signed)
PATIENT'S NAME:  Courtney Joseph, Madaris DOB:      [birth year]      MR#:    [id number]     DATE OF RECORDING: 02/01/2021 REFERRING M.D.:  Ronnie Doss, DO Study Performed:   Baseline Polysomnogram HISTORY: 46 year old woman with a history of chronic migraines, anxiety, depression, fatty liver, seasonal allergies, and mild obesity, who reports snoring and excessive daytime somnolence, difficulty maintaining sleep for years. The patient endorsed the Epworth Sleepiness Scale at 10 points. The patient's neck circumference measured 16 inches.  CURRENT MEDICATIONS: Tylenol #3, Aimovig, Wellbutrin XL, Zyrtec, Lexapro, Estrace, Flonase, Levsin SL, Prometrium, Imitrex, Drisdol, Emgality   PROCEDURE:  This is a multichannel digital polysomnogram utilizing the Somnostar 11.2 system.  Electrodes and sensors were applied and monitored per AASM Specifications.   EEG, EOG, Chin and Limb EMG, were sampled at 200 Hz.  ECG, Snore and Nasal Pressure, Thermal Airflow, Respiratory Effort, CPAP Flow and Pressure, Oximetry was sampled at 50 Hz. Digital video and audio were recorded.      BASELINE STUDY  Lights Out was at 21:14 and Lights On at 04:57.  Total recording time (TRT) was 457 minutes, with a total sleep time (TST) of 183 minutes.   The patient's sleep latency was very delayed. REM sleep was absent. The sleep efficiency was 40. %, which is markedly reduced.     SLEEP ARCHITECTURE: WASO (Wake after sleep onset) was 89 minutes with moderate sleep fragmentation noted. There were 38.5 minutes in Stage N1, 117.5 minutes Stage N2, 27 minutes Stage N3 and 0 minutes in Stage REM.  The percentage of Stage N1 was 21.%, which is markedly increased, Stage N2 was 64.2%, which is increased, Stage N3 was 14.8% and Stage R (REM sleep) was absent. The arousals were noted as: 92 were spontaneous, 0 were associated with PLMs, 12 were associated with respiratory events.  RESPIRATORY ANALYSIS:  There were a total of 21 respiratory events:  0  obstructive apneas, 0 central apneas and 0 mixed apneas with a total of 0 apneas and an apnea index (AI) of 0 /hour. There were 21 hypopneas with a hypopnea index of 6.9 /hour. The patient also had 0 respiratory event related arousals (RERAs).      The total APNEA/HYPOPNEA INDEX (AHI) was 6.9/hour and the total RESPIRATORY DISTURBANCE INDEX was  6.9 /hour.  0 events occurred in REM sleep and 42 events in NREM. The REM AHI was n/a, versus a non-REM AHI of 6.9. The patient spent 8.5 minutes of total sleep time in the supine position and 175 minutes in non-supine.. The supine AHI was 49.4 versus a non-supine AHI of 4.8.  OXYGEN SATURATION & C02:  The Wake baseline 02 saturation was 93%, with the lowest being 87%. Time spent below 89% saturation equaled 1 minutes.  PERIODIC LIMB MOVEMENTS: The patient had a total of 0 Periodic Limb Movements.  The Periodic Limb Movement (PLM) index was 0 and the PLM Arousal index was 0/hour.  Audio and video analysis did not show any abnormal or unusual movements, behaviors, phonations or vocalizations. The patient took 1 bathroom break. Mild intermittent snoring was noted. The EKG was in keeping with normal sinus rhythm (NSR).  Post-study, the patient indicated that sleep was worse than usual. She reported having a headache and took excedrin at night    IMPRESSION:  Obstructive Sleep Apnea (OSA) Poor sleep pattern Dysfunctions associated with sleep stages or arousal from sleep  RECOMMENDATIONS:  This study demonstrates overall mild obstructive sleep apnea but  study was limited due to absence of REM sleep, minimal supine sleep and poor sleep consolidation. Given the patient's medical history and sleep related complaints, treatment with positive airway pressure is recommended; this can be achieved in the form of autoPAP. Alternatively, a full-night CPAP titration study would allow optimization of therapy if needed. Other treatment options may include avoidance of  supine sleep position along with weight loss, or an oral appliance in certain patients. Surgical treatment may be a possibility in appropriate cases. Please note that untreated obstructive sleep apnea may carry additional perioperative morbidity. Patients with significant obstructive sleep apnea should receive perioperative PAP therapy and the surgeons and particularly the anesthesiologist should be informed of the diagnosis and the severity of the sleep disordered breathing. This study shows sleep fragmentation and abnormal sleep stage percentages; these are nonspecific findings and per se do not signify an intrinsic sleep disorder or a cause for the patient's sleep-related symptoms. Causes include (but are not limited to) the first night effect of the sleep study, circadian rhythm disturbances, medication effect or an underlying mood disorder or medical problem.  The patient should be cautioned not to drive, work at heights, or operate dangerous or heavy equipment when tired or sleepy. Review and reiteration of good sleep hygiene measures should be pursued with any patient. The patient will be seen in follow-up by Dr. Rexene Alberts at [facility] for discussion of the test results and further management strategies. The referring provider will be notified of the test results.  I certify that I have reviewed the entire raw data recording prior to the issuance of this report in accordance with the Standards of Accreditation of the American Academy of Sleep Medicine (AASM)    Star Age, MD, PhD Diplomat, American Board of Neurology and Sleep Medicine (Neurology and Sleep Medicine)

## 2021-02-04 NOTE — Addendum Note (Signed)
Addended by: Star Age on: 02/04/2021 05:53 PM   Modules accepted: Orders

## 2021-02-10 ENCOUNTER — Telehealth: Payer: Self-pay | Admitting: *Deleted

## 2021-02-10 ENCOUNTER — Encounter: Payer: Self-pay | Admitting: *Deleted

## 2021-02-10 NOTE — Telephone Encounter (Signed)
I discussed her sleep study results. Patient verbalized understanding and is amenable to trying an autopap machine. Pt is aware of compliance requirements, using machine at least 4 hours every night and also to be seen in the office for an initial f/u visit 31-90 days after she receives her machine. The patient's questions were answered. SheWe scheduled an initial f/u appt on Monday April 20, 2021 at 2:15 PM. We will use [facility]. Pt does not have a DME preference. Will send orders. Pt verbalized appreciation for the call.   Order, office visit, sleep study, insurance info sent to Kelly via fax. Received a receipt of confirmation. Report sent to PCP. Letter sent to patient via mychart.

## 2021-02-10 NOTE — Telephone Encounter (Signed)
-----   Message from Star Age, MD sent at 02/04/2021  5:53 PM EST ----- Patient referred by Dr. Lajuana Ripple, seen by me on 12/31/20, diagnostic PSG on 02/01/21.    Please call and notify the patient that the recent sleep study did confirm the diagnosis of obstructive sleep apnea. OSA is overall mild, but she did not sleep well. She had trouble falling asleep and staying asleep, did not achieve any dream sleep (REM sleep). Nevertheless, I recommend treatment trial for sleep apnea with an autoPAP machine. This means, that we don't have to bring her back for a second sleep study with CPAP, but will let him try an autoPAP machine at home, through a DME company (of her choice, or as per insurance requirement). The DME representative will educate her on how to use the machine, how to put the mask on, etc. I have placed an order in the chart. Please send referral, talk to patient, send report to referring MD. We will need a FU in sleep clinic for 10 weeks post-PAP set up, please arrange that with me or one of our NPs. Thanks,   Star Age, MD, PhD [facility])

## 2021-02-16 ENCOUNTER — Ambulatory Visit: Payer: BC Managed Care – PPO | Admitting: Family Medicine

## 2021-02-16 ENCOUNTER — Encounter: Payer: Self-pay | Admitting: Family Medicine

## 2021-02-16 VITALS — BP 134/88 | HR 72 | Temp 97.6°F | Ht 67.0 in | Wt 204.9 lb

## 2021-02-16 DIAGNOSIS — E7800 Pure hypercholesterolemia, unspecified: Secondary | ICD-10-CM

## 2021-02-16 DIAGNOSIS — H65193 Other acute nonsuppurative otitis media, bilateral: Secondary | ICD-10-CM

## 2021-02-16 DIAGNOSIS — F339 Major depressive disorder, recurrent, unspecified: Secondary | ICD-10-CM | POA: Diagnosis not present

## 2021-02-16 DIAGNOSIS — Z860100 Personal history of colon polyps, unspecified: Secondary | ICD-10-CM | POA: Insufficient documentation

## 2021-02-16 DIAGNOSIS — G4733 Obstructive sleep apnea (adult) (pediatric): Secondary | ICD-10-CM | POA: Diagnosis not present

## 2021-02-16 DIAGNOSIS — G43009 Migraine without aura, not intractable, without status migrainosus: Secondary | ICD-10-CM

## 2021-02-16 LAB — CMP14+EGFR
ALT: 30 IU/L (ref 0–32)
AST: 23 IU/L (ref 0–40)
Albumin/Globulin Ratio: 1.5 (ref 1.2–2.2)
Albumin: 4.3 g/dL (ref 3.8–4.8)
Alkaline Phosphatase: 76 IU/L (ref 44–121)
BUN/Creatinine Ratio: 14 (ref 9–23)
BUN: 12 mg/dL (ref 6–24)
Bilirubin Total: 0.3 mg/dL (ref 0.0–1.2)
CO2: 23 mmol/L (ref 20–29)
Calcium: 9.3 mg/dL (ref 8.7–10.2)
Chloride: 103 mmol/L (ref 96–106)
Creatinine, Ser: 0.83 mg/dL (ref 0.57–1.00)
Globulin, Total: 2.9 g/dL (ref 1.5–4.5)
Glucose: 92 mg/dL (ref 70–99)
Potassium: 4.2 mmol/L (ref 3.5–5.2)
Sodium: 139 mmol/L (ref 134–144)
Total Protein: 7.2 g/dL (ref 6.0–8.5)
eGFR: 87 mL/min/{1.73_m2} (ref >59)

## 2021-02-16 LAB — LIPID PANEL
Chol/HDL Ratio: 4.2 ratio (ref 0.0–4.4)
Cholesterol, Total: 171 mg/dL (ref 100–199)
HDL: 41 mg/dL (ref >39)
LDL Chol Calc (NIH): 111 mg/dL — ABNORMAL HIGH (ref 0–99)
Triglycerides: 103 mg/dL (ref 0–149)
VLDL Cholesterol Cal: 19 mg/dL (ref 5–40)

## 2021-02-16 MED ORDER — LEVOCETIRIZINE DIHYDROCHLORIDE 5 MG PO TABS: 5.0000 mg | ORAL_TABLET | Freq: Every evening | ORAL | 3 refills | Status: DC

## 2021-02-16 MED ORDER — ACETAMINOPHEN-CODEINE #3 300-30 MG PO TABS: 1.0000 | ORAL_TABLET | Freq: Four times a day (QID) | ORAL | 0 refills | Status: DC | PRN

## 2021-02-16 MED ORDER — BUPROPION HCL ER (XL) 150 MG PO TB24: 150.0000 mg | ORAL_TABLET | Freq: Every day | ORAL | 4 refills | Status: DC

## 2021-02-16 NOTE — Patient Instructions (Signed)
Eustachian Tube Dysfunction Eustachian tube dysfunction refers to a condition in which a blockage develops in the narrow passage that connects the middle ear to the back of the nose (eustachian tube). The eustachian tube regulates air pressure in the middle ear by letting air move between the ear and nose. It also helps to drain fluid from the middle ear space. Eustachian tube dysfunction can affect one or both ears. When the eustachian tube does not function properly, air pressure, fluid, or both can build up in the middle ear. What are the causes? This condition occurs when the eustachian tube becomes blocked or cannot open normally. Common causes of this condition include: Ear infections. Colds and other infections that affect the nose, mouth, and throat (upper respiratory tract). Allergies. Irritation from cigarette smoke. Irritation from stomach acid coming up into the esophagus (gastroesophageal reflux). The esophagus is the part of the body that moves food from the mouth to the stomach. Sudden changes in air pressure, such as from descending in an airplane or scuba diving. Abnormal growths in the nose or throat, such as: Growths that line the nose (nasal polyps). Abnormal growth of cells (tumors). Enlarged tissue at the back of the throat (adenoids). What increases the risk? You are more likely to develop this condition if: You smoke. You are overweight. You are a child who has: Certain birth defects of the mouth, such as cleft palate. Large tonsils or adenoids. What are the signs or symptoms? Common symptoms of this condition include: A feeling of fullness in the ear. Ear pain. Clicking or popping noises in the ear. Ringing in the ear (tinnitus). Hearing loss. Loss of balance. Dizziness. Symptoms may get worse when the air pressure around you changes, such as when you travel to an area of high elevation, fly on an airplane, or go scuba diving. How is this diagnosed? This  condition may be diagnosed based on: Your symptoms. A physical exam of your ears, nose, and throat. Tests, such as those that measure: The movement of your eardrum. Your hearing (audiometry). How is this treated? Treatment depends on the cause and severity of your condition. In mild cases, you may relieve your symptoms by moving air into your ears. This is called "popping the ears." In more severe cases, or if you have symptoms of fluid in your ears, treatment may include: Medicines to relieve congestion (decongestants). Medicines that treat allergies (antihistamines). Nasal sprays or ear drops that contain medicines that reduce swelling (steroids). A procedure to drain the fluid in your eardrum. In this procedure, a small tube may be placed in the eardrum to: Drain the fluid. Restore the air in the middle ear space. A procedure to insert a balloon device through the nose to inflate the opening of the eustachian tube (balloon dilation). Follow these instructions at home: Lifestyle Do not do any of the following until your health care provider approves: Travel to high altitudes. Fly in airplanes. Work in a Pension scheme manager or room. Scuba dive. Do not use any products that contain nicotine or tobacco. These products include cigarettes, chewing tobacco, and vaping devices, such as e-cigarettes. If you need help quitting, ask your health care provider. Keep your ears dry. Wear fitted earplugs during showering and bathing. Dry your ears completely after. General instructions Take over-the-counter and prescription medicines only as told by your health care provider. Use techniques to help pop your ears as recommended by your health care provider. These may include: Chewing gum. Yawning. Frequent, forceful swallowing. Closing  your mouth, holding your nose closed, and gently blowing as if you are trying to blow air out of your nose. Keep all follow-up visits. This is important. Contact a  health care provider if: Your symptoms do not go away after treatment. Your symptoms come back after treatment. You are unable to pop your ears. You have: A fever. Pain in your ear. Pain in your head or neck. Fluid draining from your ear. Your hearing suddenly changes. You become very dizzy. You lose your balance. Get help right away if: You have a sudden, severe increase in any of your symptoms. Summary Eustachian tube dysfunction refers to a condition in which a blockage develops in the eustachian tube. It can be caused by ear infections, allergies, inhaled irritants, or abnormal growths in the nose or throat. Symptoms may include ear pain or fullness, hearing loss, or ringing in the ears. Mild cases are treated with techniques to unblock the ears, such as yawning or chewing gum. More severe cases are treated with medicines or procedures. This information is not intended to replace advice given to you by your health care provider. Make sure you discuss any questions you have with your health care provider. Document Revised: 03/17/2020 Document Reviewed: 03/17/2020 Elsevier Patient Education  2022 [employer].

## 2021-02-16 NOTE — Progress Notes (Signed)
Subjective: CC: Otalgia PCP: Courtney Norlander, DO ONG:EXBMWU [name] is a 46 y.o. female presenting to clinic today for:  1.  Otalgia Patient reports bilateral otalgia.  She saw Courtney Joseph on 01/15/2021 and was placed on Omnicef.  She does report that this resulted in quite a bit of diarrhea so she discontinued it with 3 days left of the medication.  She over the weekend started having some irritation in the left ear.  Currently taking Zyrtec and Flonase.  No fevers reported.  2.  Migraine headaches Continues to suffer from migraine headaches.  She is closely working with neurology on this.  Recently diagnosed with obstructive sleep apnea on CPAP was recommended but she has not yet received this due to supply chain issues.  They are considering switching her over to Ajovy versus starting Botox injections for the migraines but this is going to be held off until she can adequately utilize the CPAP.  She admits that she is still not sleeping well.   ROS: Per HPI  Allergies  Allergen Reactions   Sulfasalazine Hives   Erythromycin Hives   Keflex [Cephalexin] Hives   Nsaids     Has gastric ulcer   Penicillins Hives   Sulfa Antibiotics Hives   Tolmetin     Has gastric ulcer   Past Medical History:  Diagnosis Date   Anxiety    on meds   Bone spur    Broken ankle 03/2018   Chronic kidney disease    h/o kidney stones   Depression    on meds   Fatty liver    Gastric erosions    Headache    migraines   Seasonal allergies    Vaginal Pap smear, abnormal     Current Outpatient Medications:    acetaminophen-codeine (TYLENOL #3) 300-30 MG tablet, Take 1 tablet by mouth every 6 (six) hours as needed for severe pain., Disp: 20 tablet, Rfl: 0   AIMOVIG 70 MG/ML SOAJ, Inject sub-q once monthly., Disp: 1.12 mL, Rfl: prn   buPROPion (WELLBUTRIN XL) 150 MG 24 hr tablet, Take 1 tablet (150 mg total) by mouth daily., Disp: 90 tablet, Rfl: 0   cetirizine (ZYRTEC) 10 MG tablet, Take 1  tablet by mouth once daily, Disp: 90 tablet, Rfl: 1   escitalopram (LEXAPRO) 20 MG tablet, Take 1 tablet (20 mg total) by mouth daily., Disp: 90 tablet, Rfl: 3   estradiol (ESTRACE) 2 MG tablet, Take 1 tablet (2 mg total) by mouth daily., Disp: 30 tablet, Rfl: 6   fluticasone (FLONASE) 50 MCG/ACT nasal spray, Place 2 sprays into both nostrils daily. (Patient taking differently: Place 2 sprays into both nostrils daily as needed.), Disp: 16 g, Rfl: 6   hyoscyamine (LEVSIN SL) 0.125 MG SL tablet, Place 1 tablet (0.125 mg total) under the tongue every 6 (six) hours as needed., Disp: 30 tablet, Rfl: 1   PLENVU 140 g SOLR, See admin instructions., Disp: , Rfl:    progesterone (PROMETRIUM) 200 MG capsule, Take 1 at bedtime (Patient taking differently: Take 200 mg by mouth daily. Take 1 at bedtime), Disp: 30 capsule, Rfl: 6   SUMAtriptan (IMITREX) 100 MG tablet, Take 1 tablet (100 mg total) by mouth every 2 (two) hours as needed for migraine. May repeat in 2 hours if headache persists or recurs. (Patient not taking: Reported on 01/15/2021), Disp: 10 tablet, Rfl: 5   Vitamin D, Ergocalciferol, (DRISDOL) 1.25 MG (50000 UNIT) CAPS capsule, Take 1 capsule (50,000 Units total) by mouth  every 7 (seven) days., Disp: 13 capsule, Rfl: 3 Social History   Socioeconomic History   Marital status: Divorced    Spouse name: Not on file   Number of children: Not on file   Years of education: Not on file   Highest education level: Not on file  Occupational History   Occupation: staff account  Tobacco Use   Smoking status: Former    Types: Cigarettes    Quit date: 10/15/2011    Years since quitting: 9.3   Smokeless tobacco: Never  Vaping Use   Vaping Use: Former  Substance and Sexual Activity   Alcohol use: Not Currently    Comment: Very RARE   Drug use: No   Sexual activity: Yes    Birth control/protection: Surgical    Comment: ablation  Other Topics Concern   Not on file  Social History Narrative   Not on  file   Social Determinants of Health   Financial Resource Strain: Not on file  Food Insecurity: Not on file  Transportation Needs: Not on file  Physical Activity: Not on file  Stress: Not on file  Social Connections: Not on file  Intimate Partner Violence: Not on file   Family History  Problem Relation Age of Onset   Hypertension Mother    Colon polyps Father 66   Depression Father    Hypertension Father    Depression Brother    Learning disabilities Brother    Cancer Maternal Aunt    Diabetes Paternal Uncle    Early death Maternal Grandfather    Heart disease Maternal Grandfather    Cancer Paternal Grandmother    Depression Paternal Grandmother    Diabetes Paternal Grandmother    Heart disease Paternal Grandfather    Colon cancer Neg Hx    Esophageal cancer Neg Hx    Prostate cancer Neg Hx    Rectal cancer Neg Hx    Stomach cancer Neg Hx    Sleep apnea Neg Hx     Objective: Office vital signs reviewed. BP 134/88    Pulse 72    Temp 97.6 F (36.4 C)    Ht 5' 7"  (1.702 m)    Wt 204 lb 14.4 oz (92.9 kg)    SpO2 97%    BMI 32.09 kg/m   Physical Examination:  General: Awake, alert, nontoxic female, No acute distress HEENT: TMs intact bilaterally with clear bubbling effusion behind the eardrum.  There is mild bulging.  No associated erythema or purulence. Cardio: regular rate and rhythm, S1S2 heard, no murmurs appreciated Pulm: clear to auscultation bilaterally, no wheezes, rhonchi or rales; normal work of breathing on room air Extremities: warm, well perfused, No edema, cyanosis or clubbing; +2 pulses bilaterally  Assessment/ Plan: 46 y.o. female   Migraine without aura and without status migrainosus, not intractable - Plan: acetaminophen-codeine (TYLENOL #3) 300-30 MG tablet  OSA (obstructive sleep apnea)  Depression, recurrent ([place]) - Plan: buPROPion (WELLBUTRIN XL) 150 MG 24 hr tablet  Acute effusion of both middle ears - Plan: levocetirizine (XYZAL) 5 MG  tablet  Pure hypercholesterolemia - Plan: Lipid Panel, CMP14+EGFR  Migraine headaches or not well controlled.  Possibility of going on Ajovy versus Botox injections with neurology.  We will eagerly await their recommendations  I agree that OSA should be treated.  I hope that this improve her migraines and fatigue   Depression is well controlled with Wellbutrin.  This is been renewed  She appears to have bilateral clear effusions behind the  tympanic membrane.  There certainly does not show any evidence of ongoing infection.  We discussed switching her antihistamine to Xyzal.  Continue Flonase.  We discussed eustachian tube OMT drainage maneuver.  Handout was provided today on eustachian tube dysfunction  Fasting lipid ordered today  No orders of the defined types were placed in this encounter.  No orders of the defined types were placed in this encounter.    Courtney Norlander, DO [place] [phone]

## 2021-02-23 NOTE — Telephone Encounter (Signed)
Received fax from [facility].  Pt cancelled her appt and stated she will not be getting a machine due to the cost  Payment options were discussed , order will now be voided.

## 2021-02-24 ENCOUNTER — Other Ambulatory Visit: Payer: Self-pay

## 2021-02-24 ENCOUNTER — Encounter: Payer: Self-pay | Admitting: Adult Health

## 2021-02-24 ENCOUNTER — Other Ambulatory Visit (HOSPITAL_COMMUNITY)
Admission: RE | Admit: 2021-02-24 | Discharge: 2021-02-24 | Disposition: A | Discharge status: Home / Self Care | Payer: BC Managed Care – PPO | Source: Ambulatory Visit | Attending: Adult Health | Admitting: Adult Health

## 2021-02-24 ENCOUNTER — Ambulatory Visit (INDEPENDENT_AMBULATORY_CARE_PROVIDER_SITE_OTHER): Payer: BC Managed Care – PPO | Admitting: Adult Health

## 2021-02-24 VITALS — BP 136/82 | HR 84 | Ht 67.0 in | Wt 205.0 lb

## 2021-02-24 DIAGNOSIS — R61 Generalized hyperhidrosis: Secondary | ICD-10-CM

## 2021-02-24 DIAGNOSIS — Z1211 Encounter for screening for malignant neoplasm of colon: Secondary | ICD-10-CM | POA: Diagnosis not present

## 2021-02-24 DIAGNOSIS — Z79890 Hormone replacement therapy (postmenopausal): Secondary | ICD-10-CM | POA: Diagnosis not present

## 2021-02-24 DIAGNOSIS — Z01419 Encounter for gynecological examination (general) (routine) without abnormal findings: Secondary | ICD-10-CM | POA: Diagnosis not present

## 2021-02-24 LAB — HEMOCCULT GUIAC POC 1CARD (OFFICE): Fecal Occult Blood, POC: NEGATIVE

## 2021-02-24 MED ORDER — PROGESTERONE 200 MG PO CAPS: ORAL_CAPSULE | ORAL | 3 refills | Status: DC

## 2021-02-24 MED ORDER — ESTRADIOL 2 MG PO TABS: 2.0000 mg | ORAL_TABLET | Freq: Every day | ORAL | 3 refills | Status: DC

## 2021-02-24 NOTE — Progress Notes (Signed)
Patient ID: Courtney Joseph, female   DOB: 1975-10-26, 46 y.o.   MRN: [id number] History of Present Illness: Courtney Joseph is a 46 year old white female, divorced, G1P1 in for a well woman gyn exam and pap. PCP is Dr Lajuana Ripple   Current Medications, Allergies, Past Medical History, Past Surgical History, Family History and Social History were reviewed in [facility] record.     Review of Systems: Patient denies any headaches, hearing loss, fatigue, blurred vision, shortness of breath, chest pain, abdominal pain, problems with bowel movements, urination, or intercourse. No joint pain or mood swings.  Having some night sweats   Physical Exam:BP 136/82 (BP Location: Left Arm, Patient Position: Sitting, Cuff Size: Normal)    Pulse 84    Ht 5' 7"  (1.702 m)    Wt 205 lb (93 kg)    BMI 32.11 kg/m   General:  Well developed, well nourished, no acute distress Skin:  Warm and dry Neck:  Midline trachea, normal thyroid, good ROM, no lymphadenopathy Lungs; Clear to auscultation bilaterally Breast:  No dominant palpable mass, retraction, or nipple discharge Cardiovascular: Regular rate and rhythm Abdomen:  Soft, non tender, no hepatosplenomegaly Pelvic:  External genitalia is normal in appearance, no lesions.  The vagina is normal in appearance. Urethra has no lesions or masses. The cervix is bulbous. Pap with HR HPV genotyping performed.  Uterus is felt to be normal size, shape, and contour.  No adnexal masses or tenderness noted.Bladder is non tender, no masses felt. Rectal: Good sphincter tone, no polyps, or hemorrhoids felt.  Hemoccult negative. Extremities/musculoskeletal:  No swelling or varicosities noted, no clubbing or cyanosis Psych:  No mood changes, alert and cooperative,seems happy AA is 1 Fall risk is low Depression screen [facility] 2/9 02/24/2021 02/16/2021 01/15/2021  Decreased Interest 0 0 0  Down, Depressed, Hopeless 0 0 0  PHQ - 2 Score 0 0 0  Altered sleeping 1 - -   Tired, decreased energy 1 1 0  Change in appetite 0 1 0  Feeling bad or failure about yourself  0 0 0  Trouble concentrating 0 0 1  Moving slowly or fidgety/restless 0 0 0  Suicidal thoughts 0 0 0  PHQ-9 Score 2 - -  Difficult doing work/chores - Not difficult at all Not difficult at all  Some recent data might be hidden    GAD 7 : Generalized Anxiety Score 02/24/2021 02/16/2021 01/15/2021 10/15/2020  Nervous, Anxious, on Edge 0 0 0 0  Control/stop worrying 0 0 0 1  Worry too much - different things 0 1 0 1  Trouble relaxing 1 1 1  0  Restless 0 0 0 0  Easily annoyed or irritable 0 0 0 1  Afraid - awful might happen 0 0 0 0  Total GAD 7 Score 1 2 1 3   Anxiety Difficulty - Not difficult at all Not difficult at all Somewhat difficult       Upstream - 02/24/21 1038       Pregnancy Intention Screening   Does the patient want to become pregnant in the next year? No    Does the patient's partner want to become pregnant in the next year? No    Would the patient like to discuss contraceptive options today? No      Contraception Wrap Up   Current Method No Method - Other Reason   ablation   End Method No Method - Other Reason   ablation   Contraception Counseling Provided No           .  Examination chaperoned by Courtney Squibb LPN   Impression and Plan: 1. Encounter for gynecological examination with Papanicolaou smear of cervix Pap sent Physical in 1 year Pap in 3 if normal Colonoscopy per GI  Mammogram in May  2. Hormone replacement therapy (HRT) Will continue estrace and Prometrium  Meds ordered this encounter  Medications   estradiol (ESTRACE) 2 MG tablet    Sig: Take 1 tablet (2 mg total) by mouth daily.    Dispense:  90 tablet    Refill:  3    Order Specific Question:   Supervising Provider    Answer:   Tania Ade H [[id number]]   progesterone (PROMETRIUM) 200 MG capsule    Sig: Take 1 at bedtime    Dispense:  90 capsule    Refill:  3    Order Specific Question:    Supervising Provider    Answer:   Elonda Husky, LUTHER H [[id number]]     3. Night sweats Better since taking estrace at night   4. Encounter for screening fecal occult blood testing

## 2021-02-25 LAB — CYTOLOGY - PAP
Adequacy: ABSENT
Comment: NEGATIVE
Diagnosis: NEGATIVE
High risk HPV: NEGATIVE

## 2021-02-27 ENCOUNTER — Other Ambulatory Visit: Payer: Self-pay | Admitting: Adult Health

## 2021-02-27 MED ORDER — METRONIDAZOLE 500 MG PO TABS: 500.0000 mg | ORAL_TABLET | Freq: Two times a day (BID) | ORAL | 0 refills | Status: DC

## 2021-02-27 NOTE — Progress Notes (Signed)
+  BV on pap, will rx flagyl

## 2021-03-17 ENCOUNTER — Encounter: Payer: Self-pay | Admitting: Family Medicine

## 2021-03-31 ENCOUNTER — Encounter: Payer: Self-pay | Admitting: Family Medicine

## 2021-04-10 ENCOUNTER — Ambulatory Visit: Payer: BC Managed Care – PPO | Admitting: Family Medicine

## 2021-04-10 ENCOUNTER — Encounter: Payer: Self-pay | Admitting: Family Medicine

## 2021-04-10 VITALS — BP 124/81 | HR 75 | Temp 97.2°F | Ht 67.0 in | Wt 205.4 lb

## 2021-04-10 DIAGNOSIS — E7800 Pure hypercholesterolemia, unspecified: Secondary | ICD-10-CM | POA: Diagnosis not present

## 2021-04-10 DIAGNOSIS — E669 Obesity, unspecified: Secondary | ICD-10-CM

## 2021-04-10 DIAGNOSIS — G4733 Obstructive sleep apnea (adult) (pediatric): Secondary | ICD-10-CM | POA: Diagnosis not present

## 2021-04-10 DIAGNOSIS — F339 Major depressive disorder, recurrent, unspecified: Secondary | ICD-10-CM

## 2021-04-10 MED ORDER — BUPROPION HCL ER (XL) 300 MG PO TB24: 300.0000 mg | ORAL_TABLET | Freq: Every day | ORAL | 3 refills | Status: DC

## 2021-04-10 MED ORDER — WEGOVY 1.7 MG/0.75ML ~~LOC~~ SOAJ: 1.7000 mg | SUBCUTANEOUS | 0 refills | Status: DC

## 2021-04-10 MED ORDER — WEGOVY 0.5 MG/0.5ML ~~LOC~~ SOAJ: 0.5000 mg | SUBCUTANEOUS | 0 refills | Status: DC

## 2021-04-10 MED ORDER — WEGOVY 1 MG/0.5ML ~~LOC~~ SOAJ: 1.0000 mg | SUBCUTANEOUS | 0 refills | Status: DC

## 2021-04-10 NOTE — Progress Notes (Signed)
? ?Subjective: ?CC: Med refills ?PCP: Janora Norlander, DO ?AGT:Courtney Joseph is a 46 y.o. female presenting to clinic today for: ? ?1.  Moodiness ?Patient is treated for depression and anxiety with Lexapro and Wellbutrin.  She notes that she was written up yesterday due to a confrontation at work.  She feels like she does have some moodiness that seems to be exacerbated as of late.  She reports additional stressors at work which are impacting her mood.  She is interested in adjusting medication today ? ?2.  Morbid obesity ?Patient is interested in pursuing medication for morbid obesity.  She has been on Topamax for migraine headaches but this had 0 impact on her weight or appetite.  She has been on over-the-counter products with no improvement.  She is interested in pursuing injection therapy.  No personal or family history of nodular thyroid cancer, multiple endocrine type II neoplasia.  No personal history of pancreatitis.  She has an ablation in place for contraception and no plans to conceive going forward. ? ?ROS: Per HPI ? ?Allergies  ?Allergen Reactions  ? Sulfasalazine Hives  ? Erythromycin Hives  ? Keflex [Cephalexin] Hives  ? Nsaids   ?  Has gastric ulcer  ? Penicillins Hives  ? Sulfa Antibiotics Hives  ? Tolmetin   ?  Has gastric ulcer  ? ?Past Medical History:  ?Diagnosis Date  ? Anxiety   ? on meds  ? Bone spur   ? Broken ankle 03/2018  ? Chronic kidney disease   ? h/o kidney stones  ? Depression   ? on meds  ? Fatty liver   ? Gastric erosions   ? Headache   ? migraines  ? Seasonal allergies   ? Vaginal Pap smear, abnormal   ? ? ?Current Outpatient Medications:  ?  acetaminophen-codeine (TYLENOL #3) 300-30 MG tablet, Take 1 tablet by mouth every 6 (six) hours as needed for severe pain., Disp: 20 tablet, Rfl: 0 ?  AIMOVIG 70 MG/ML SOAJ, Inject sub-q once monthly., Disp: 1.12 mL, Rfl: prn ?  buPROPion (WELLBUTRIN XL) 150 MG 24 hr tablet, Take 1 tablet (150 mg total) by mouth daily., Disp: 90 tablet,  Rfl: 4 ?  escitalopram (LEXAPRO) 20 MG tablet, Take 1 tablet (20 mg total) by mouth daily., Disp: 90 tablet, Rfl: 3 ?  estradiol (ESTRACE) 2 MG tablet, Take 1 tablet (2 mg total) by mouth daily., Disp: 90 tablet, Rfl: 3 ?  fluticasone (FLONASE) 50 MCG/ACT nasal spray, Place 2 sprays into both nostrils daily., Disp: 16 g, Rfl: 6 ?  hyoscyamine (LEVSIN SL) 0.125 MG SL tablet, Place 1 tablet (0.125 mg total) under the tongue every 6 (six) hours as needed., Disp: 30 tablet, Rfl: 1 ?  levocetirizine (XYZAL) 5 MG tablet, Take 1 tablet (5 mg total) by mouth every evening. To replace Zyrtec, Disp: 90 tablet, Rfl: 3 ?  progesterone (PROMETRIUM) 200 MG capsule, Take 1 at bedtime, Disp: 90 capsule, Rfl: 3 ?  SUMAtriptan (IMITREX) 100 MG tablet, Take 1 tablet (100 mg total) by mouth every 2 (two) hours as needed for migraine. May repeat in 2 hours if headache persists or recurs., Disp: 10 tablet, Rfl: 5 ?  Vitamin D, Ergocalciferol, (DRISDOL) 1.25 MG (50000 UNIT) CAPS capsule, Take 1 capsule (50,000 Units total) by mouth every 7 (seven) days., Disp: 13 capsule, Rfl: 3 ?Social History  ? ?Socioeconomic History  ? Marital status: Divorced  ?  Spouse name: Not on file  ? Number of children: Not  on file  ? Years of education: Not on file  ? Highest education level: Not on file  ?Occupational History  ? Occupation: staff account  ?Tobacco Use  ? Smoking status: Former  ?  Types: Cigarettes  ?  Quit date: 10/15/2011  ?  Years since quitting: 9.4  ? Smokeless tobacco: Never  ?Vaping Use  ? Vaping Use: Former  ?Substance and Sexual Activity  ? Alcohol use: Not Currently  ?  Comment: Very RARE  ? Drug use: No  ? Sexual activity: Yes  ?  Birth control/protection: Surgical  ?  Comment: ablation  ?Other Topics Concern  ? Not on file  ?Social History Narrative  ? Not on file  ? ?Social Determinants of Health  ? ?Financial Resource Strain: Low Risk   ? Difficulty of Paying Living Expenses: Not hard at all  ?Food Insecurity: No Food Insecurity   ? Worried About Charity fundraiser in the Last Year: Never true  ? Ran Out of Food in the Last Year: Never true  ?Transportation Needs: No Transportation Needs  ? Lack of Transportation (Medical): No  ? Lack of Transportation (Non-Medical): No  ?Physical Activity: Inactive  ? Days of Exercise per Week: 0 days  ? Minutes of Exercise per Session: 0 min  ?Stress: Stress Concern Present  ? Feeling of Stress : Very much  ?Social Connections: Socially Isolated  ? Frequency of Communication with Friends and Family: More than three times a week  ? Frequency of Social Gatherings with Friends and Family: Three times a week  ? Attends Religious Services: Never  ? Active Member of Clubs or Organizations: No  ? Attends Archivist Meetings: Never  ? Marital Status: Divorced  ?Intimate Partner Violence: Not At Risk  ? Fear of Current or Ex-Partner: No  ? Emotionally Abused: No  ? Physically Abused: No  ? Sexually Abused: No  ? ?Family History  ?Problem Relation Age of Onset  ? Hypertension Mother   ? Colon polyps Father 24  ? Depression Father   ? Hypertension Father   ? Depression Brother   ? Learning disabilities Brother   ? Cancer Maternal Aunt   ? Diabetes Paternal Uncle   ? Early death Maternal Grandfather   ? Heart disease Maternal Grandfather   ? Cancer Paternal Grandmother   ? Depression Paternal Grandmother   ? Diabetes Paternal Grandmother   ? Heart disease Paternal Grandfather   ? Colon cancer Neg Hx   ? Esophageal cancer Neg Hx   ? Prostate cancer Neg Hx   ? Rectal cancer Neg Hx   ? Stomach cancer Neg Hx   ? Sleep apnea Neg Hx   ? ? ?Objective: ?Office vital signs reviewed. ?BP 124/81   Pulse 75   Temp (!) 97.2 ?F (36.2 ?C)   Ht 5' 7"  (1.702 m)   Wt 205 lb 6.4 oz (93.2 kg)   SpO2 95%   BMI 32.17 kg/m?  ? ?Physical Examination:  ?General: Awake, alert, obese, No acute distress ?HEENT: Sclera white.  Moist mucous membranes ?Cardio: regular rate and rhythm ?Pulm: Normal work of breathing on room  air ?Psych: Stressed appearing ? ?  04/10/2021  ?  3:54 PM 02/24/2021  ? 10:38 AM 02/16/2021  ?  8:17 AM  ?Depression screen PHQ 2/9  ?Decreased Interest 1 0 0  ?Down, Depressed, Hopeless 0 0 0  ?PHQ - 2 Score 1 0 0  ?Altered sleeping  1   ?Tired, decreased energy  1 1 1   ?Change in appetite 1 0 1  ?Feeling bad or failure about yourself  1 0 0  ?Trouble concentrating 0 0 0  ?Moving slowly or fidgety/restless 0 0 0  ?Suicidal thoughts 0 0 0  ?PHQ-9 Score  2   ?Difficult doing work/chores Somewhat difficult  Not difficult at all  ? ? ?  04/10/2021  ?  3:54 PM 02/24/2021  ? 10:39 AM 02/16/2021  ?  8:17 AM 01/15/2021  ? 12:01 PM  ?GAD 7 : Generalized Anxiety Score  ?Nervous, Anxious, on Edge 0 0 0 0  ?Control/stop worrying 1 0 0 0  ?Worry too much - different things 1 0 1 0  ?Trouble relaxing 1 1 1 1   ?Restless 0 0 0 0  ?Easily annoyed or irritable 2 0 0 0  ?Afraid - awful might happen 0 0 0 0  ?Total GAD 7 Score 5 1 2 1   ?Anxiety Difficulty Somewhat difficult  Not difficult at all Not difficult at all  ? ? ?Assessment/ Plan: ?46 y.o. female  ? ?Obesity (BMI 30.0-34.9) - Plan: Bayer DCA Hb A1c Waived, Semaglutide-Weight Management (WEGOVY) 0.5 MG/0.5ML SOAJ, Semaglutide-Weight Management (WEGOVY) 1 MG/0.5ML SOAJ, Semaglutide-Weight Management (WEGOVY) 1.7 MG/0.75ML SOAJ ? ?Pure hypercholesterolemia - Plan: Semaglutide-Weight Management (WEGOVY) 0.5 MG/0.5ML SOAJ, Semaglutide-Weight Management (WEGOVY) 1 MG/0.5ML SOAJ, Semaglutide-Weight Management (WEGOVY) 1.7 MG/0.75ML SOAJ ? ?OSA (obstructive sleep apnea) - Plan: Semaglutide-Weight Management (WEGOVY) 0.5 MG/0.5ML SOAJ, Semaglutide-Weight Management (WEGOVY) 1 MG/0.5ML SOAJ, Semaglutide-Weight Management (WEGOVY) 1.7 MG/0.75ML SOAJ ? ?Depression, recurrent ([place]) - Plan: buPROPion (WELLBUTRIN XL) 300 MG 24 hr tablet, Semaglutide-Weight Management (WEGOVY) 0.5 MG/0.5ML SOAJ, Semaglutide-Weight Management (WEGOVY) 1 MG/0.5ML SOAJ, Semaglutide-Weight Management (WEGOVY) 1.7  MG/0.75ML SOAJ ? ?Sample of [facility] provided.  First injection administered during the visit today.  No apparent contraindications to use.  We will plan for follow-up in 6 weeks and then again at the 72-monthmarker for c

## 2021-04-11 MED ORDER — FLUTICASONE PROPIONATE 50 MCG/ACT NA SUSP: 2.0000 | Freq: Every day | NASAL | 6 refills | Status: DC

## 2021-04-16 ENCOUNTER — Telehealth: Payer: Self-pay | Admitting: Neurology

## 2021-04-16 ENCOUNTER — Telehealth: Payer: Self-pay | Admitting: *Deleted

## 2021-04-16 NOTE — Telephone Encounter (Signed)
(  Key: [id number]) ?Rx #: [id number] ?STMHDQ 1MG/0.5ML auto-injectors ?  ?Form ?[employer] Electronic PA Form [id number] NCPDP) ? ?SENT TO PLAN ?

## 2021-04-16 NOTE — Telephone Encounter (Signed)
Pt cancelled appt due to unable to afford cost of machine. ?

## 2021-04-20 ENCOUNTER — Ambulatory Visit: Payer: BC Managed Care – PPO | Admitting: Neurology

## 2021-05-22 ENCOUNTER — Encounter: Payer: Self-pay | Admitting: Family Medicine

## 2021-05-22 ENCOUNTER — Ambulatory Visit: Payer: BC Managed Care – PPO | Admitting: Family Medicine

## 2021-05-22 VITALS — BP 128/78 | HR 85 | Temp 97.5°F | Ht 67.0 in | Wt 197.6 lb

## 2021-05-22 DIAGNOSIS — E669 Obesity, unspecified: Secondary | ICD-10-CM | POA: Diagnosis not present

## 2021-05-22 DIAGNOSIS — G4733 Obstructive sleep apnea (adult) (pediatric): Secondary | ICD-10-CM | POA: Diagnosis not present

## 2021-05-22 DIAGNOSIS — E7800 Pure hypercholesterolemia, unspecified: Secondary | ICD-10-CM | POA: Diagnosis not present

## 2021-05-22 MED ORDER — WEGOVY 2.4 MG/0.75ML ~~LOC~~ SOAJ: 2.4000 mg | SUBCUTANEOUS | 4 refills | Status: DC

## 2021-05-22 NOTE — Progress Notes (Signed)
? ?Subjective: ?CC: Follow-up obesity ?PCP: Janora Norlander, DO ?YDX:Courtney Joseph is a 46 y.o. female presenting to clinic today for: ? ?1.  Obesity, hyperlipidemia, obstructive sleep apnea ?Patient reports compliance with the [facility].  This started at the end of March.  She notes that she has noticed a positive improvement in appetite and satiety.  She feels energy is better.  No reports of nausea, vomiting, abdominal pain. ? ? ?ROS: Per HPI ? ?Allergies  ?Allergen Reactions  ? Sulfasalazine Hives  ? Erythromycin Hives  ? Keflex [Cephalexin] Hives  ? Nsaids   ?  Has gastric ulcer  ? Penicillins Hives  ? Sulfa Antibiotics Hives  ? Tolmetin   ?  Has gastric ulcer  ? ?Past Medical History:  ?Diagnosis Date  ? Anxiety   ? on meds  ? Bone spur   ? Broken ankle 03/2018  ? Chronic kidney disease   ? h/o kidney stones  ? Depression   ? on meds  ? Fatty liver   ? Gastric erosions   ? Headache   ? migraines  ? Seasonal allergies   ? Vaginal Pap smear, abnormal   ? ? ?Current Outpatient Medications:  ?  acetaminophen-codeine (TYLENOL #3) 300-30 MG tablet, Take 1 tablet by mouth every 6 (six) hours as needed for severe pain., Disp: 20 tablet, Rfl: 0 ?  AIMOVIG 70 MG/ML SOAJ, Inject sub-q once monthly., Disp: 1.12 mL, Rfl: prn ?  buPROPion (WELLBUTRIN XL) 300 MG 24 hr tablet, Take 1 tablet (300 mg total) by mouth daily. Replacing 150, Disp: 90 tablet, Rfl: 3 ?  escitalopram (LEXAPRO) 20 MG tablet, Take 1 tablet (20 mg total) by mouth daily., Disp: 90 tablet, Rfl: 3 ?  estradiol (ESTRACE) 2 MG tablet, Take 1 tablet (2 mg total) by mouth daily., Disp: 90 tablet, Rfl: 3 ?  fluticasone (FLONASE) 50 MCG/ACT nasal spray, Place 2 sprays into both nostrils daily., Disp: 16 g, Rfl: 6 ?  hyoscyamine (LEVSIN SL) 0.125 MG SL tablet, Place 1 tablet (0.125 mg total) under the tongue every 6 (six) hours as needed., Disp: 30 tablet, Rfl: 1 ?  levocetirizine (XYZAL) 5 MG tablet, Take 1 tablet (5 mg total) by mouth every evening. To replace  Zyrtec, Disp: 90 tablet, Rfl: 3 ?  progesterone (PROMETRIUM) 200 MG capsule, Take 1 at bedtime, Disp: 90 capsule, Rfl: 3 ?  Semaglutide-Weight Management (WEGOVY) 0.5 MG/0.5ML SOAJ, Inject 0.5 mg into the skin every 7 (seven) days. Month #2, Disp: 2 mL, Rfl: 0 ?  Semaglutide-Weight Management (WEGOVY) 1 MG/0.5ML SOAJ, Inject 1 mg into the skin every 7 (seven) days. Month #3, Disp: 2 mL, Rfl: 0 ?  Semaglutide-Weight Management (WEGOVY) 1.7 MG/0.75ML SOAJ, Inject 1.7 mg into the skin every 7 (seven) days. Month#4, Disp: 3 mL, Rfl: 0 ?  SUMAtriptan (IMITREX) 100 MG tablet, Take 1 tablet (100 mg total) by mouth every 2 (two) hours as needed for migraine. May repeat in 2 hours if headache persists or recurs., Disp: 10 tablet, Rfl: 5 ?  Vitamin D, Ergocalciferol, (DRISDOL) 1.25 MG (50000 UNIT) CAPS capsule, Take 1 capsule (50,000 Units total) by mouth every 7 (seven) days., Disp: 13 capsule, Rfl: 3 ?Social History  ? ?Socioeconomic History  ? Marital status: Divorced  ?  Spouse name: Not on file  ? Number of children: Not on file  ? Years of education: Not on file  ? Highest education level: Not on file  ?Occupational History  ? Occupation: staff account  ?Tobacco  Use  ? Smoking status: Former  ?  Types: Cigarettes  ?  Quit date: 10/15/2011  ?  Years since quitting: 9.6  ? Smokeless tobacco: Never  ?Vaping Use  ? Vaping Use: Former  ?Substance and Sexual Activity  ? Alcohol use: Not Currently  ?  Comment: Very RARE  ? Drug use: No  ? Sexual activity: Yes  ?  Birth control/protection: Surgical  ?  Comment: ablation  ?Other Topics Concern  ? Not on file  ?Social History Narrative  ? Not on file  ? ?Social Determinants of Health  ? ?Financial Resource Strain: Low Risk   ? Difficulty of Paying Living Expenses: Not hard at all  ?Food Insecurity: No Food Insecurity  ? Worried About Charity fundraiser in the Last Year: Never true  ? Ran Out of Food in the Last Year: Never true  ?Transportation Needs: No Transportation Needs  ?  Lack of Transportation (Medical): No  ? Lack of Transportation (Non-Medical): No  ?Physical Activity: Inactive  ? Days of Exercise per Week: 0 days  ? Minutes of Exercise per Session: 0 min  ?Stress: Stress Concern Present  ? Feeling of Stress : Very much  ?Social Connections: Socially Isolated  ? Frequency of Communication with Friends and Family: More than three times a week  ? Frequency of Social Gatherings with Friends and Family: Three times a week  ? Attends Religious Services: Never  ? Active Member of Clubs or Organizations: No  ? Attends Archivist Meetings: Never  ? Marital Status: Divorced  ?Intimate Partner Violence: Not At Risk  ? Fear of Current or Ex-Partner: No  ? Emotionally Abused: No  ? Physically Abused: No  ? Sexually Abused: No  ? ?Family History  ?Problem Relation Age of Onset  ? Hypertension Mother   ? Colon polyps Father 11  ? Depression Father   ? Hypertension Father   ? Depression Brother   ? Learning disabilities Brother   ? Cancer Maternal Aunt   ? Diabetes Paternal Uncle   ? Early death Maternal Grandfather   ? Heart disease Maternal Grandfather   ? Cancer Paternal Grandmother   ? Depression Paternal Grandmother   ? Diabetes Paternal Grandmother   ? Heart disease Paternal Grandfather   ? Colon cancer Neg Hx   ? Esophageal cancer Neg Hx   ? Prostate cancer Neg Hx   ? Rectal cancer Neg Hx   ? Stomach cancer Neg Hx   ? Sleep apnea Neg Hx   ? ? ?Objective: ?Office vital signs reviewed. ?BP 128/78   Pulse 85   Temp (!) 97.5 ?F (36.4 ?C)   Ht 5' 7"  (1.702 m)   Wt 197 lb 9.6 oz (89.6 kg)   SpO2 95%   BMI 30.95 kg/m?  ? ?Physical Examination:  ?General: Awake, alert, well nourished, No acute distress ?HEENT: Sclera white.  Moist mucous membranes ?Cardio: regular rate and rhythm ?Pulm: Normal work of breathing on room air ? ?Assessment/ Plan: ?46 y.o. female  ? ?Obesity (BMI 30.0-34.9) - Plan: Semaglutide-Weight Management (WEGOVY) 2.4 MG/0.75ML SOAJ ? ?OSA (obstructive sleep  apnea) - Plan: Semaglutide-Weight Management (WEGOVY) 2.4 MG/0.75ML SOAJ ? ?Pure hypercholesterolemia - Plan: Semaglutide-Weight Management (WEGOVY) 2.4 MG/0.75ML SOAJ ? ?Having a good response to [facility].  No significant GI side effects at the 61m dose so far.  I have gone ahead and send over the 2.4 mg,  Which she will start in the next couple of months.  We  discussed ongoing titration of the medication as directed.  We will plan to see each other back again in July for weight recheck.  I am pretty confident that she will reach her weight loss goals of 4% of her body weight down by that visit as she is already down 8 pounds in the last 6 weeks. ? ?We will plan for fasting labs. ? ? ?No orders of the defined types were placed in this encounter. ? ?No orders of the defined types were placed in this encounter. ? ? ? ?Janora Norlander, DO ?[place] ?[phone] ? ? ?

## 2021-05-29 ENCOUNTER — Encounter: Payer: Self-pay | Admitting: Family Medicine

## 2021-06-16 ENCOUNTER — Ambulatory Visit: Payer: BC Managed Care – PPO | Admitting: Family Medicine

## 2021-08-10 ENCOUNTER — Ambulatory Visit: Payer: BC Managed Care – PPO | Admitting: Family Medicine

## 2021-09-04 ENCOUNTER — Ambulatory Visit: Payer: BC Managed Care – PPO | Admitting: Family Medicine

## 2021-09-04 ENCOUNTER — Other Ambulatory Visit: Payer: Self-pay | Admitting: Family Medicine

## 2021-09-04 ENCOUNTER — Encounter: Payer: Self-pay | Admitting: Family Medicine

## 2021-09-04 VITALS — BP 139/80 | HR 71 | Temp 97.3°F | Ht 67.0 in | Wt 173.4 lb

## 2021-09-04 DIAGNOSIS — G43001 Migraine without aura, not intractable, with status migrainosus: Secondary | ICD-10-CM

## 2021-09-04 DIAGNOSIS — E669 Obesity, unspecified: Secondary | ICD-10-CM | POA: Diagnosis not present

## 2021-09-04 DIAGNOSIS — Z79899 Other long term (current) drug therapy: Secondary | ICD-10-CM | POA: Diagnosis not present

## 2021-09-04 DIAGNOSIS — E66811 Obesity, class 1: Secondary | ICD-10-CM

## 2021-09-04 DIAGNOSIS — G43009 Migraine without aura, not intractable, without status migrainosus: Secondary | ICD-10-CM

## 2021-09-04 MED ORDER — METHYLPREDNISOLONE ACETATE 80 MG/ML IJ SUSP
80.0000 mg | Freq: Once | INTRAMUSCULAR | Status: AC
  Administered 2021-09-04: 80 mg via INTRAMUSCULAR

## 2021-09-04 MED ORDER — ACETAMINOPHEN-CODEINE 300-30 MG PO TABS: 1.0000 | ORAL_TABLET | Freq: Four times a day (QID) | ORAL | 0 refills | Status: DC | PRN

## 2021-09-04 MED ORDER — VITAMIN D (ERGOCALCIFEROL) 1.25 MG (50000 UNIT) PO CAPS: 50000.0000 [IU] | ORAL_CAPSULE | ORAL | 3 refills | Status: DC

## 2021-09-04 NOTE — Progress Notes (Signed)
Subjective: CC: Follow-up obesity PCP: Courtney Norlander, DO YCX:KGYJEH Courtney Joseph is a 46 y.o. female presenting to clinic today for:  1.  Obesity Patient here for interval follow-up on obesity.  Associated comorbidities include hyperlipidemia, OSA.  She is being treated with [facility] for weight loss and notes that she is up to 2.4 mg subcutaneously every week.  She feels good.  Energy has been better.  No nausea, vomiting or abdominal pain reported.  2.  Migraine headache Patient is having an active migraine headache that has been present for the last several days.  She has taken her Tylenol 3 but this has not helped.  She is treated with Aimovig and Imitrex.  Previously failed Nurtec.  Has Zofran on hand at home if needed   ROS: Per HPI  Allergies  Allergen Reactions   Sulfasalazine Hives   Erythromycin Hives   Keflex [Cephalexin] Hives   Nsaids     Has gastric ulcer   Penicillins Hives   Sulfa Antibiotics Hives   Tolmetin     Has gastric ulcer   Past Medical History:  Diagnosis Date   Anxiety    on meds   Bone spur    Broken ankle 03/2018   Chronic kidney disease    h/o kidney stones   Depression    on meds   Fatty liver    Gastric erosions    Headache    migraines   Seasonal allergies    Vaginal Pap smear, abnormal     Current Outpatient Medications:    acetaminophen-codeine (TYLENOL #3) 300-30 MG tablet, Take 1 tablet by mouth every 6 (six) hours as needed for severe pain., Disp: 20 tablet, Rfl: 0   AIMOVIG 70 MG/ML SOAJ, Inject sub-q once monthly., Disp: 1.12 mL, Rfl: prn   buPROPion (WELLBUTRIN XL) 300 MG 24 hr tablet, Take 1 tablet (300 mg total) by mouth daily. Replacing 150, Disp: 90 tablet, Rfl: 3   escitalopram (LEXAPRO) 20 MG tablet, Take 1 tablet (20 mg total) by mouth daily., Disp: 90 tablet, Rfl: 3   estradiol (ESTRACE) 2 MG tablet, Take 1 tablet (2 mg total) by mouth daily., Disp: 90 tablet, Rfl: 3   fluticasone (FLONASE) 50 MCG/ACT nasal spray,  Place 2 sprays into both nostrils daily., Disp: 16 g, Rfl: 6   hyoscyamine (LEVSIN SL) 0.125 MG SL tablet, Place 1 tablet (0.125 mg total) under the tongue every 6 (six) hours as needed., Disp: 30 tablet, Rfl: 1   levocetirizine (XYZAL) 5 MG tablet, Take 1 tablet (5 mg total) by mouth every evening. To replace Zyrtec, Disp: 90 tablet, Rfl: 3   progesterone (PROMETRIUM) 200 MG capsule, Take 1 at bedtime, Disp: 90 capsule, Rfl: 3   Semaglutide-Weight Management (WEGOVY) 2.4 MG/0.75ML SOAJ, Inject 2.4 mg into the skin every 7 (seven) days., Disp: 9 mL, Rfl: 4   SUMAtriptan (IMITREX) 100 MG tablet, Take 1 tablet (100 mg total) by mouth every 2 (two) hours as needed for migraine. May repeat in 2 hours if headache persists or recurs., Disp: 10 tablet, Rfl: 5   Vitamin D, Ergocalciferol, (DRISDOL) 1.25 MG (50000 UNIT) CAPS capsule, Take 1 capsule (50,000 Units total) by mouth every 7 (seven) days., Disp: 13 capsule, Rfl: 3 Social History   Socioeconomic History   Marital status: Divorced    Spouse name: Not on file   Number of children: Not on file   Years of education: Not on file   Highest education level: Not on file  Occupational History   Occupation: staff account  Tobacco Use   Smoking status: Former    Types: Cigarettes    Quit date: 10/15/2011    Years since quitting: 9.8   Smokeless tobacco: Never  Vaping Use   Vaping Use: Former  Substance and Sexual Activity   Alcohol use: Not Currently    Comment: Very RARE   Drug use: No   Sexual activity: Yes    Birth control/protection: Surgical    Comment: ablation  Other Topics Concern   Not on file  Social History Narrative   Not on file   Social Determinants of Health   Financial Resource Strain: Low Risk  (02/24/2021)   Overall Financial Resource Strain (CARDIA)    Difficulty of Paying Living Expenses: Not hard at all  Food Insecurity: No Food Insecurity (02/24/2021)   Hunger Vital Sign    Worried About Running Out of Food in the  Last Year: Never true    [place] in the Last Year: Never true  Transportation Needs: No Transportation Needs (02/24/2021)   PRAPARE - Hydrologist (Medical): No    Lack of Transportation (Non-Medical): No  Physical Activity: Inactive (02/24/2021)   Exercise Vital Sign    Days of Exercise per Week: 0 days    Minutes of Exercise per Session: 0 min  Stress: Stress Concern Present (02/24/2021)   [place]    Feeling of Stress : Very much  Social Connections: Socially Isolated (02/24/2021)   Social Connection and Isolation Panel [NHANES]    Frequency of Communication with Friends and Family: More than three times a week    Frequency of Social Gatherings with Friends and Family: Three times a week    Attends Religious Services: Never    Active Member of Clubs or Organizations: No    Attends Archivist Meetings: Never    Marital Status: Divorced  Human resources officer Violence: Not At Risk (02/24/2021)   Humiliation, Afraid, Rape, and Kick questionnaire    Fear of Current or Ex-Partner: No    Emotionally Abused: No    Physically Abused: No    Sexually Abused: No   Family History  Problem Relation Age of Onset   Hypertension Mother    Colon polyps Father 76   Depression Father    Hypertension Father    Depression Brother    Learning disabilities Brother    Cancer Maternal Aunt    Diabetes Paternal Uncle    Early death Maternal Grandfather    Heart disease Maternal Grandfather    Cancer Paternal Grandmother    Depression Paternal Grandmother    Diabetes Paternal Grandmother    Heart disease Paternal Grandfather    Colon cancer Neg Hx    Esophageal cancer Neg Hx    Prostate cancer Neg Hx    Rectal cancer Neg Hx    Stomach cancer Neg Hx    Sleep apnea Neg Hx     Objective: Office vital signs reviewed. BP (!) 145/97   Pulse 71   Temp (!) 97.3 F (36.3 C)   Ht '5\' 7"'$  (1.702 m)    Wt 173 lb 6.4 oz (78.7 kg)   SpO2 96%   BMI 27.16 kg/m   Physical Examination:  General: Awake, alert, well nourished, appears uncomfortable HEENT: Sclera white.  PERRLA, EOMI Cardio: regular rate and rhythm, S1S2 heard, no murmurs appreciated Pulm: clear to auscultation bilaterally, no wheezes,  rhonchi or rales; normal work of breathing on room air Neuro: No focal neurologic deficits  Assessment/ Plan: 46 y.o. female   Migraine without aura and with status migrainosus, not intractable - Plan: acetaminophen-codeine (TYLENOL #3) 300-30 MG tablet, methylPREDNISolone acetate (DEPO-MEDROL) injection 80 mg, ToxASSURE Select 13 (MW), Urine  Controlled substance agreement signed  Obesity (BMI 30.0-34.9)  Active migraine headache.  Depo 80 administered.  Tylenol 3 renewed.  UDS and CSC were updated as per office policy  She is having excellent progress with Wegovy.  20 pounds loss since May.  Continue 2.4 mg subcutaneously every 7 days.  Discussed that she could start spacing this out every 10 to 14 days if she wants to see how she can do without medication.  No orders of the defined types were placed in this encounter.  No orders of the defined types were placed in this encounter.    Courtney Norlander, DO [place] [phone]

## 2021-09-10 LAB — TOXASSURE SELECT 13 (MW), URINE

## 2021-11-23 ENCOUNTER — Telehealth: Payer: Self-pay

## 2021-11-23 NOTE — Telephone Encounter (Signed)
LEBA TIBBITTS (KeyLeeann Must) Rx #: [id number] IWLNLG 2.'4MG'$ /0.75ML auto-injectors   Form [employer] Electronic PA Form [phone] NCPDP) Created 3 days ago Sent to Plan 2 minutes ago Plan Response 2 minutes ago Submit Clinical Questions 1 minute ago Determination Wait for Determination Please wait for [employer] NCPDP 2017 to return a determination.

## 2021-11-26 NOTE — Telephone Encounter (Signed)
[facility] [employer]  received a request from your provider for coverage of Wegovy (semaglutide injection). As long as you remain covered by the [facility] and there are no changes to your plan benefits, this request is approved for the following time period: 11/23/2021 - 11/24/2022

## 2021-12-07 ENCOUNTER — Ambulatory Visit: Payer: BC Managed Care – PPO | Admitting: Family Medicine

## 2021-12-09 ENCOUNTER — Telehealth: Payer: Self-pay

## 2021-12-09 NOTE — Telephone Encounter (Signed)
EMARI HREHA Key: [id number] - PA Case ID: [id number] Need help? Call us at [phone] Status Sent to [place] '70MG'$ /ML auto-injectors

## 2021-12-15 ENCOUNTER — Ambulatory Visit (INDEPENDENT_AMBULATORY_CARE_PROVIDER_SITE_OTHER): Payer: BC Managed Care – PPO | Admitting: Family Medicine

## 2021-12-15 ENCOUNTER — Encounter: Payer: Self-pay | Admitting: Family Medicine

## 2021-12-15 VITALS — Ht 67.0 in | Wt 165.0 lb

## 2021-12-15 DIAGNOSIS — G43709 Chronic migraine without aura, not intractable, without status migrainosus: Secondary | ICD-10-CM

## 2021-12-15 DIAGNOSIS — E663 Overweight: Secondary | ICD-10-CM | POA: Diagnosis not present

## 2021-12-15 DIAGNOSIS — F339 Major depressive disorder, recurrent, unspecified: Secondary | ICD-10-CM

## 2021-12-15 DIAGNOSIS — J3089 Other allergic rhinitis: Secondary | ICD-10-CM | POA: Diagnosis not present

## 2021-12-15 DIAGNOSIS — Z6825 Body mass index (BMI) 25.0-25.9, adult: Secondary | ICD-10-CM

## 2021-12-15 DIAGNOSIS — G43001 Migraine without aura, not intractable, with status migrainosus: Secondary | ICD-10-CM

## 2021-12-15 MED ORDER — LEVOCETIRIZINE DIHYDROCHLORIDE 5 MG PO TABS: 5.0000 mg | ORAL_TABLET | Freq: Every evening | ORAL | 3 refills | Status: DC

## 2021-12-15 MED ORDER — BUPROPION HCL ER (XL) 300 MG PO TB24: 300.0000 mg | ORAL_TABLET | Freq: Every day | ORAL | 3 refills | Status: DC

## 2021-12-15 MED ORDER — SUMATRIPTAN SUCCINATE 100 MG PO TABS: 100.0000 mg | ORAL_TABLET | Freq: Once | ORAL | 5 refills | Status: DC

## 2021-12-15 MED ORDER — AIMOVIG 70 MG/ML ~~LOC~~ SOAJ: SUBCUTANEOUS | 4 refills | Status: DC

## 2021-12-15 MED ORDER — ESCITALOPRAM OXALATE 20 MG PO TABS: 20.0000 mg | ORAL_TABLET | Freq: Every day | ORAL | 3 refills | Status: DC

## 2021-12-15 NOTE — Telephone Encounter (Signed)
Outcome Approved on November 22 Your PA request has been approved. Additional information will be provided in the approval communication. (Message 1145) Authorization Expiration Date: 12/09/2022

## 2021-12-15 NOTE — Progress Notes (Signed)
Telephone visit  Subjective: CC: Follow-up weight, migraines PCP: Janora Norlander, DO BOF:BPZWCH Courtney Joseph is a 46 y.o. female calls for telephone consult today. Patient provides verbal consent for consult held via phone.  Due to COVID-19 pandemic this visit was conducted virtually. This visit type was conducted due to national recommendations for restrictions regarding the COVID-19 Pandemic (e.g. social distancing, sheltering in place) in an effort to limit this patient's exposure and mitigate transmission in our community. All issues noted in this document were discussed and addressed.  A physical exam was not performed with this format.   Location of patient: work  Location of provider: [facility] Others present for call: non  1.  Overweight Has stretched her shot out to every 10 days.  Was on prednisone for RSV infection recently, so she hasn't tried 2 week intervals yet.      2. Migraines Has 6-8 migraine headaches per month.  She was getting at least 12 per month before the [place].  She was on Emgality (didn't last long enough), side effects from Topamax, Nurtec (didn't help at all), Imitrex (minimal help but has on hand). Also keeps Tylenol #3 on hand for severe migraines.  She's on Lexapro so can't take TCA.  ROS: Per HPI  Allergies  Allergen Reactions   Sulfasalazine Hives   Erythromycin Hives   Keflex [Cephalexin] Hives   Nsaids     Has gastric ulcer   Penicillins Hives   Sulfa Antibiotics Hives   Tolmetin     Has gastric ulcer   Past Medical History:  Diagnosis Date   Anxiety    on meds   Bone spur    Broken ankle 03/2018   Chronic kidney disease    h/o kidney stones   Depression    on meds   Fatty liver    Gastric erosions    Headache    migraines   Seasonal allergies    Vaginal Pap smear, abnormal     Current Outpatient Medications:    acetaminophen-codeine (TYLENOL #3) 300-30 MG tablet, Take 1 tablet by mouth every 6 (six) hours as needed for moderate  pain., Disp: 20 tablet, Rfl: 0   AIMOVIG 70 MG/ML SOAJ, Inject sub-q once monthly., Disp: 1.12 mL, Rfl: prn   buPROPion (WELLBUTRIN XL) 300 MG 24 hr tablet, Take 1 tablet (300 mg total) by mouth daily. Replacing 150, Disp: 90 tablet, Rfl: 3   escitalopram (LEXAPRO) 20 MG tablet, Take 1 tablet (20 mg total) by mouth daily., Disp: 90 tablet, Rfl: 3   estradiol (ESTRACE) 2 MG tablet, Take 1 tablet (2 mg total) by mouth daily., Disp: 90 tablet, Rfl: 3   fluticasone (FLONASE) 50 MCG/ACT nasal spray, Place 2 sprays into both nostrils daily., Disp: 16 g, Rfl: 6   hyoscyamine (LEVSIN SL) 0.125 MG SL tablet, Place 1 tablet (0.125 mg total) under the tongue every 6 (six) hours as needed., Disp: 30 tablet, Rfl: 1   levocetirizine (XYZAL) 5 MG tablet, Take 1 tablet (5 mg total) by mouth every evening. To replace Zyrtec, Disp: 90 tablet, Rfl: 3   progesterone (PROMETRIUM) 200 MG capsule, Take 1 at bedtime, Disp: 90 capsule, Rfl: 3   Semaglutide-Weight Management (WEGOVY) 2.4 MG/0.75ML SOAJ, Inject 2.4 mg into the skin every 7 (seven) days., Disp: 9 mL, Rfl: 4   SUMAtriptan (IMITREX) 100 MG tablet, Take 1 tablet (100 mg total) by mouth every 2 (two) hours as needed for migraine. May repeat in 2 hours if headache persists or recurs.,  Disp: 10 tablet, Rfl: 5   Vitamin D, Ergocalciferol, (DRISDOL) 1.25 MG (50000 UNIT) CAPS capsule, Take 1 capsule (50,000 Units total) by mouth every 7 (seven) days., Disp: 13 capsule, Rfl: 3     12/15/2021    1:23 PM 09/04/2021    3:12 PM 09/04/2021    2:28 PM  Vitals with BMI  Height _0   _1   Weight 165 lbs  173 lbs 6 oz  BMI 58.52  77.82  Systolic  423 536  Diastolic  80 97  Pulse   71    Assessment/ Plan: 46 y.o. female   Migraine without aura and with status migrainosus, not intractable - Plan: AIMOVIG 70 MG/ML SOAJ, SUMAtriptan (IMITREX) 100 MG tablet, CBC  BMI 25.0-25.9,adult - Plan: Lipid panel, CMP14+EGFR, TSH  Chronic migraine without aura without status  migrainosus, not intractable  Depression, recurrent (HCC) - Plan: escitalopram (LEXAPRO) 20 MG tablet, buPROPion (WELLBUTRIN XL) 300 MG 24 hr tablet, CBC  Non-seasonal allergic rhinitis due to other allergic trigger - Plan: levocetirizine (XYZAL) 5 MG tablet, CBC  Has had an excellent reduction in her weight and is currently trying to space out her doses.  I think this is reasonable.  She will come in for fasting labs sometime in January.  I have resubmitted her Aimovig and I suspect we will have to do another prior authorization.  Imitrex renewed for as needed use as well.  She has failed multiple modalities of treatment including Topamax, Emgality, Nurtec.  Continues to use the T3 sparingly.  Has had successful migraine reduction by about 50% with the Aimovig  Start time: 1:22pm End time: 1:31pm  Total time spent on patient care (including telephone call/ virtual visit): 9 minutes  [place], [place] [phone]

## 2022-01-22 ENCOUNTER — Encounter (HOSPITAL_COMMUNITY): Payer: Self-pay | Admitting: Adult Health

## 2022-01-22 ENCOUNTER — Other Ambulatory Visit (HOSPITAL_COMMUNITY): Payer: Self-pay | Admitting: Adult Health

## 2022-01-22 DIAGNOSIS — Z1231 Encounter for screening mammogram for malignant neoplasm of breast: Secondary | ICD-10-CM

## 2022-01-27 ENCOUNTER — Ambulatory Visit (HOSPITAL_COMMUNITY): Payer: BC Managed Care – PPO

## 2022-02-03 ENCOUNTER — Ambulatory Visit (HOSPITAL_COMMUNITY)
Admission: RE | Admit: 2022-02-03 | Discharge: 2022-02-03 | Disposition: A | Discharge status: Home / Self Care | Payer: BC Managed Care – PPO | Source: Ambulatory Visit | Attending: Adult Health | Admitting: Adult Health

## 2022-02-03 DIAGNOSIS — Z1231 Encounter for screening mammogram for malignant neoplasm of breast: Secondary | ICD-10-CM | POA: Diagnosis present

## 2022-02-17 ENCOUNTER — Other Ambulatory Visit: Payer: Self-pay | Admitting: Adult Health

## 2022-02-21 ENCOUNTER — Encounter: Payer: Self-pay | Admitting: Family Medicine

## 2022-02-23 ENCOUNTER — Telehealth: Payer: Self-pay

## 2022-02-23 NOTE — Telephone Encounter (Signed)
Jenne Pane (Key: [id number]) PA Case ID #: [id number] Need Help? Call us at [phone] Status sent iconSent to Plan today Drug Aimovig '70MG'$ /ML auto-injectors ePA cloud Child psychotherapist Electronic PA Form [phone] NCPDP)

## 2022-02-24 NOTE — Telephone Encounter (Signed)
DENIED  Your plan only covers this drug when you meet one of these options: A) You have tried other drugs your plan covers (preferred drugs), and they did not work well for you, or B) Your doctor gives [place] a medical reason you cannot take those other drugs. For your plan, you may need to try up to three preferred drugs. We have denied your request because you do not meet any of these conditions. We reviewed the information we had. Your request has been denied. Your doctor can send [place] any new or missing information for [place] to review. The preferred drugs for your plan are: AJOVY, EMGALITY, QULIPTA. (Requirement: 3 in a class with 3 or more alternatives, 2 in a class with 2 alternatives, or 1 in a class with only 1 alternative

## 2022-03-01 NOTE — Telephone Encounter (Signed)
Patient wants to know what she is supposed to do about migraine medication.

## 2022-03-01 NOTE — Telephone Encounter (Signed)
I have already reached out to julie to see if she can take a look at that denial.  Patient meets criteria for meds given multiple failures which are clearly outlined in last note.  Not sure why she is being denied.

## 2022-03-01 NOTE — Telephone Encounter (Signed)
Can you make sure this PA is done properly?

## 2022-03-02 ENCOUNTER — Ambulatory Visit (INDEPENDENT_AMBULATORY_CARE_PROVIDER_SITE_OTHER): Payer: BC Managed Care – PPO | Admitting: Adult Health

## 2022-03-02 ENCOUNTER — Encounter: Payer: Self-pay | Admitting: Adult Health

## 2022-03-02 VITALS — BP 138/95 | HR 83 | Ht 67.0 in | Wt 166.5 lb

## 2022-03-02 DIAGNOSIS — Z79890 Hormone replacement therapy (postmenopausal): Secondary | ICD-10-CM

## 2022-03-02 DIAGNOSIS — Z01419 Encounter for gynecological examination (general) (routine) without abnormal findings: Secondary | ICD-10-CM | POA: Diagnosis not present

## 2022-03-02 DIAGNOSIS — Z1211 Encounter for screening for malignant neoplasm of colon: Secondary | ICD-10-CM

## 2022-03-02 LAB — HEMOCCULT GUIAC POC 1CARD (OFFICE): Fecal Occult Blood, POC: NEGATIVE

## 2022-03-02 MED ORDER — ESTRADIOL 2 MG PO TABS: 2.0000 mg | ORAL_TABLET | Freq: Every day | ORAL | 4 refills | Status: DC

## 2022-03-02 MED ORDER — PROGESTERONE 200 MG PO CAPS: ORAL_CAPSULE | ORAL | 4 refills | Status: DC

## 2022-03-02 NOTE — Progress Notes (Signed)
Patient ID: Courtney Joseph, female   DOB: September 23, 1975, 47 y.o.   MRN: [id number] History of Present Illness: Miss is a 47 year old white female, divorced, in for well woman gyn exam. She has hot flashes on and off on HRT. She has lost about 40 lbs on Wegovy.  Last pap  negative HPV and NILM 02/24/21.  PCP is Dr Lajuana Ripple.   Current Medications, Allergies, Past Medical History, Past Surgical History, Family History and Social History were reviewed in [employer] record.     Review of Systems: Patient denies any headaches, hearing loss, fatigue, blurred vision, shortness of breath, chest pain, abdominal pain, problems with bowel movements, urination, or intercourse. No joint pain or mood swings.  See HPI for positives.    Physical Exam:BP (!) 138/95 (BP Location: Right Arm, Patient Position: Sitting, Cuff Size: Normal)   Pulse 83   Ht 5' 7"$  (1.702 m)   Wt 166 lb 8 oz (75.5 kg)   BMI 26.08 kg/m   General:  Well developed, well nourished, no acute distress Skin:  Warm and dry Neck:  Midline trachea, normal thyroid, good ROM, no lymphadenopathy Lungs; Clear to auscultation bilaterally Breast:  No dominant palpable mass, retraction, or nipple discharge Cardiovascular: Regular rate and rhythm Abdomen:  Soft, non tender, no hepatosplenomegaly Pelvic:  External genitalia is normal in appearance, no lesions.  The vagina is normal in appearance. Urethra has no lesions or masses. The cervix is bulbous.  Uterus is felt to be normal size, shape, and contour.  No adnexal masses or tenderness noted.Bladder is non tender, no masses felt. Rectal: Good sphincter tone, no polyps, or hemorrhoids felt.  Hemoccult negative. Extremities/musculoskeletal:  No swelling or varicosities noted, no clubbing or cyanosis Psych:  No mood changes, alert and cooperative,seems happy AA is 1 Fall risk  is low    03/02/2022    3:48 PM 09/04/2021    2:42 PM 05/22/2021    4:10 PM  Depression screen  PHQ 2/9  Decreased Interest 1 0 1  Down, Depressed, Hopeless 0 0 0  PHQ - 2 Score 1 0 1  Altered sleeping 2 1   Tired, decreased energy 1 1 1  $ Change in appetite 0 0 1  Feeling bad or failure about yourself  0 0 0  Trouble concentrating 0 0 0  Moving slowly or fidgety/restless 0 0 0  Suicidal thoughts 0 0 0  PHQ-9 Score 4 2   Difficult doing work/chores  Not difficult at all Not difficult at all       03/02/2022    3:48 PM 09/04/2021    2:42 PM 05/22/2021    4:10 PM 04/10/2021    3:54 PM  GAD 7 : Generalized Anxiety Score  Nervous, Anxious, on Edge 0 0 1 0  Control/stop worrying 0 0 0 1  Worry too much - different things 0 0 0 1  Trouble relaxing 1 1 0 1  Restless 0 0 0 0  Easily annoyed or irritable 0 1 0 2  Afraid - awful might happen 0 0 0 0  Total GAD 7 Score 1 2 1 5  $ Anxiety Difficulty  Somewhat difficult Not difficult at all Somewhat difficult      Upstream - 03/02/22 1547       Pregnancy Intention Screening   Does the patient want to become pregnant in the next year? No    Does the patient's partner want to become pregnant in the next year? No  Would the patient like to discuss contraceptive options today? No      Contraception Wrap Up   Current Method No Method - Other Reason   ablation   Reason for No Current Contraceptive Method at Intake ([facility] Only) Other    End Method No Method - Other Reason   ablation   Contraception Counseling Provided No             Examination chaperoned by Levy Pupa LPN  Impression and Plan: 1. Encounter for well woman exam with routine gynecological exam Physical in 1 year Pap in 2026 Had negative mammogram 02/03/22 Labs with PCP Colonoscopy per GI  2. Hormone replacement therapy (HRT) Has hot flashes on and off.  Happy with HRT will refill  Meds ordered this encounter  Medications   estradiol (ESTRACE) 2 MG tablet    Sig: Take 1 tablet (2 mg total) by mouth daily.    Dispense:  90 tablet    Refill:  4    Order  Specific Question:   Supervising Provider    Answer:   Tania Ade H [[id number]]   progesterone (PROMETRIUM) 200 MG capsule    Sig: Take 1 capsule by mouth at bedtime    Dispense:  90 capsule    Refill:  4    Order Specific Question:   Supervising Provider    Answer:   Elonda Husky, LUTHER H [[id number]]     3. Encounter for screening fecal occult blood testing Hemoccult was negative

## 2022-03-04 ENCOUNTER — Telehealth: Payer: Self-pay | Admitting: Pharmacist

## 2022-03-04 DIAGNOSIS — G43001 Migraine without aura, not intractable, with status migrainosus: Secondary | ICD-10-CM

## 2022-03-04 NOTE — Telephone Encounter (Signed)
Patient has not tried Qulipta  Samples left up front #30 day supply Qulipta 62m daily ( 1 tab by mouth daily) Will [facility] to [facility] Patient has now tried/failed the following: Emgality Ajovy Nurtec Qulipta Sumatriptan for abortive Can't take TCAs (amitriptyline, etc) No beta blockers (propranolol, etc)  Appeal faxed to [phone] scanned in  Please f/u appeal status

## 2022-03-11 ENCOUNTER — Encounter: Payer: Self-pay | Admitting: Family Medicine

## 2022-03-11 ENCOUNTER — Encounter: Payer: Self-pay | Admitting: Pharmacist

## 2022-03-17 NOTE — Telephone Encounter (Signed)
Insurance says they dont have documentation showing failed attempts of preferred drugs. Needs documentation sent to them.   Appeal # [id number]

## 2022-03-18 NOTE — Telephone Encounter (Signed)
Detailed Appeal was already filed on 03/05/22 All of this info (trials/fails) was included and my return fax was confirmed/received by:   I will fax one more time to the above number Please follow up on this appeal   Phone numbers below:

## 2022-04-22 ENCOUNTER — Other Ambulatory Visit: Payer: Self-pay | Admitting: Family Medicine

## 2022-04-27 ENCOUNTER — Other Ambulatory Visit (HOSPITAL_COMMUNITY): Payer: Self-pay

## 2022-04-27 NOTE — Telephone Encounter (Signed)
Pharmacy Patient Advocate Encounter  Insurance verification completed.    The patient is insured through    The patient is currently admitted and ran test claims for the following: Qulipta 30mg  and received a paid claim.   This test claim was processed through [facility]- copay amounts may vary at other pharmacies due to pharmacy/plan contracts, or as the patient moves through the different stages of their insurance plan.

## 2022-05-03 ENCOUNTER — Ambulatory Visit: Payer: BC Managed Care – PPO | Admitting: Family Medicine

## 2022-07-28 ENCOUNTER — Telehealth: Payer: Self-pay

## 2022-07-28 NOTE — Transitions of Care (Post Inpatient/ED Visit) (Signed)
07/28/2022  Name: Courtney Joseph MRN: [id number] DOB: 08-04-1975  Today's TOC FU Call Status: Today's TOC FU Call Status:: Successful TOC FU Call Competed TOC FU Call Complete Date: 07/28/22  Transition Care Management Follow-up Telephone Call Date of Discharge: 07/27/22 Discharge Facility: Other ([facility]) Name of Other ([facility]) [facility]charge Facility: [facility] Type of Discharge: Emergency Department Reason for ED Visit: Other: (RUQ pain) How have you been since you were released from the hospital?: Better Any questions or concerns?: No  Items Reviewed: Did you receive and understand the discharge instructions provided?: Yes Medications obtained,verified, and reconciled?: Yes (Medications Reviewed) Any new allergies since your discharge?: No Dietary orders reviewed?: Yes  Medications Reviewed Today: Medications Reviewed Today     Reviewed by Karena Addison, LPN (Licensed Practical Nurse) on 07/28/22 at 1206  Med List Status: <None>   Medication Order Taking? Sig Documenting Provider Last Dose Status Informant  acetaminophen-codeine (TYLENOL #3) 300-30 MG tablet [id number] No Take 1 tablet by mouth every 6 (six) hours as needed for moderate pain. Raliegh Ip, DO Taking Active   AIMOVIG 70 MG/ML SOAJ [id number] No Inject sub-q once monthly.  Patient not taking: Reported on 03/02/2022   Raliegh Ip, DO Not Taking Active   buPROPion (WELLBUTRIN XL) 300 MG 24 hr tablet [id number] No Take 1 tablet (300 mg total) by mouth daily. Replacing 150 Delynn Flavin M, [place] Taking Active   escitalopram (LEXAPRO) 20 MG tablet [id number] No Take 1 tablet (20 mg total) by mouth daily. Delynn Flavin M, DO Taking Active   estradiol (ESTRACE) 2 MG tablet [id number]  Take 1 tablet (2 mg total) by mouth daily. Adline Potter, NP  Active   fluticasone [facility]) 50 MCG/ACT nasal spray [id number]  Use 2 spray(s) in each nostril once daily Gottschalk, Ashly M, DO  Active    hyoscyamine (LEVSIN SL) 0.125 MG SL tablet [id number] No Place 1 tablet (0.125 mg total) under the tongue every 6 (six) hours as needed.  Patient not taking: Reported on 03/02/2022   Unk Lightning, [place] Not Taking Active   levocetirizine (XYZAL) 5 MG tablet [id number] No Take 1 tablet (5 mg total) by mouth every evening. To replace Zyrtec Raliegh Ip, DO Taking Active   progesterone (PROMETRIUM) 200 MG capsule [id number]  Take 1 capsule by mouth at bedtime Adline Potter, NP  Active   Semaglutide-Weight Management (WEGOVY) 2.4 MG/0.75ML SOAJ [id number] No Inject 2.4 mg into the skin every 7 (seven) days. Delynn Flavin M, DO Taking Active   SUMAtriptan (IMITREX) 100 MG tablet [id number] No Take 100 mg by mouth every 2 (two) hours as needed for migraine. May repeat in 2 hours if headache persists or recurs. [provider] Taking Active   Vitamin D, Ergocalciferol, (DRISDOL) 1.25 MG (50000 UNIT) CAPS capsule [id number] No Take 1 capsule (50,000 Units total) by mouth every 7 (seven) days. Raliegh Ip, DO Taking Active             Home Care and Equipment/Supplies: Were Home Health Services Ordered?: NA Any new equipment or medical supplies ordered?: NA  Functional Questionnaire: Do you need assistance with bathing/showering or dressing?: No Do you need assistance with meal preparation?: No Do you need assistance with eating?: No Do you have difficulty maintaining continence: No Do you need assistance with getting out of bed/getting out of a chair/moving?: No Do you have difficulty managing or taking your medications?: No  Follow up appointments reviewed: PCP Follow-up appointment confirmed?: Yes  Date of PCP follow-up appointment?: 08/06/22 Follow-up Provider: [facility] Follow-up appointment confirmed?: NA Do you need transportation to your follow-up appointment?: No Do you understand care options if your condition(s) worsen?:  Yes-patient verbalized understanding    SIGNATURE Karena Addison, LPN [facility] Nurse Health Advisor Direct Dial [phone]

## 2022-07-30 HISTORY — PX: GALLBLADDER SURGERY: SHX652

## 2022-08-02 ENCOUNTER — Telehealth: Payer: Self-pay

## 2022-08-02 NOTE — Transitions of Care (Post Inpatient/ED Visit) (Signed)
08/02/2022  Name: Courtney Joseph MRN: [id number] DOB: 1975/11/23  Today's TOC FU Call Status: Today's TOC FU Call Status:: Successful TOC FU Call Competed TOC FU Call Complete Date: 08/02/22  Transition Care Management Follow-up Telephone Call Date of Discharge: 07/31/22 Discharge Facility: Other [facility]) Name of [facility] Facility: [facility] Type of Discharge: Inpatient Admission Primary Inpatient Discharge Diagnosis:: cholecystitis How have you been since you were released from the hospital?: Better Any questions or concerns?: No  Items Reviewed: Did you receive and understand the discharge instructions provided?: Yes Medications obtained,verified, and reconciled?: Yes (Medications Reviewed) Any new allergies since your discharge?: No Dietary orders reviewed?: Yes Do you have support at home?: Yes People in Home: spouse  Medications Reviewed Today: Medications Reviewed Today     Reviewed by Karena Addison, LPN (Licensed Practical Nurse) on 08/02/22 at 1702  Med List Status: <None>   Medication Order Taking? Sig Documenting Provider Last Dose Status Informant  acetaminophen-codeine (TYLENOL #3) 300-30 MG tablet [id number] No Take 1 tablet by mouth every 6 (six) hours as needed for moderate pain. Raliegh Ip, DO Taking Active   AIMOVIG 70 MG/ML SOAJ [id number] No Inject sub-q once monthly.  Patient not taking: Reported on 03/02/2022   Raliegh Ip, DO Not Taking Active   buPROPion (WELLBUTRIN XL) 300 MG 24 hr tablet [id number] No Take 1 tablet (300 mg total) by mouth daily. Replacing 150 Delynn Flavin M, [place] Taking Active   escitalopram (LEXAPRO) 20 MG tablet [id number] No Take 1 tablet (20 mg total) by mouth daily. Delynn Flavin M, DO Taking Active   estradiol (ESTRACE) 2 MG tablet [id number]  Take 1 tablet (2 mg total) by mouth daily. Adline Potter, NP  Active   fluticasone [facility]) 50 MCG/ACT nasal spray [id number]  Use 2  spray(s) in each nostril once daily Gottschalk, Ashly M, DO  Active   hyoscyamine (LEVSIN SL) 0.125 MG SL tablet [id number] No Place 1 tablet (0.125 mg total) under the tongue every 6 (six) hours as needed.  Patient not taking: Reported on 03/02/2022   Unk Lightning, [place] Not Taking Active   levocetirizine (XYZAL) 5 MG tablet [id number] No Take 1 tablet (5 mg total) by mouth every evening. To replace Zyrtec Raliegh Ip, DO Taking Active   progesterone (PROMETRIUM) 200 MG capsule [id number]  Take 1 capsule by mouth at bedtime Adline Potter, NP  Active   Semaglutide-Weight Management (WEGOVY) 2.4 MG/0.75ML SOAJ [id number] No Inject 2.4 mg into the skin every 7 (seven) days. Delynn Flavin M, DO Taking Active   SUMAtriptan (IMITREX) 100 MG tablet [id number] No Take 100 mg by mouth every 2 (two) hours as needed for migraine. May repeat in 2 hours if headache persists or recurs. [provider] Taking Active   Vitamin D, Ergocalciferol, (DRISDOL) 1.25 MG (50000 UNIT) CAPS capsule [id number] No Take 1 capsule (50,000 Units total) by mouth every 7 (seven) days. Raliegh Ip, DO Taking Active             Home Care and Equipment/Supplies: Were Home Health Services Ordered?: NA Any new equipment or medical supplies ordered?: NA  Functional Questionnaire: Do you need assistance with bathing/showering or dressing?: No Do you need assistance with meal preparation?: No Do you need assistance with eating?: No Do you have difficulty maintaining continence: No Do you need assistance with getting out of bed/getting out of a chair/moving?: No Do you have difficulty managing or taking your medications?: No  Follow up appointments reviewed: PCP Follow-up appointment confirmed?: Yes Date of PCP follow-up appointment?: 08/06/22 Follow-up Provider: [facility] Follow-up appointment confirmed?: NA Do you need transportation to your follow-up appointment?:  No Do you understand care options if your condition(s) worsen?: Yes-patient verbalized understanding    SIGNATURE Karena Addison, LPN [facility] Nurse Health Advisor Direct Dial [phone]

## 2022-08-06 ENCOUNTER — Ambulatory Visit: Payer: BC Managed Care – PPO | Admitting: Family Medicine

## 2022-08-12 ENCOUNTER — Encounter: Payer: Self-pay | Admitting: Family Medicine

## 2022-09-07 ENCOUNTER — Ambulatory Visit (INDEPENDENT_AMBULATORY_CARE_PROVIDER_SITE_OTHER): Payer: BC Managed Care – PPO | Admitting: Adult Health

## 2022-09-07 ENCOUNTER — Encounter: Payer: Self-pay | Admitting: Adult Health

## 2022-09-07 VITALS — BP 145/89 | HR 69 | Ht 67.0 in | Wt 184.0 lb

## 2022-09-07 DIAGNOSIS — R61 Generalized hyperhidrosis: Secondary | ICD-10-CM | POA: Diagnosis not present

## 2022-09-07 DIAGNOSIS — G479 Sleep disorder, unspecified: Secondary | ICD-10-CM

## 2022-09-07 DIAGNOSIS — R232 Flushing: Secondary | ICD-10-CM | POA: Diagnosis not present

## 2022-09-07 DIAGNOSIS — Z79890 Hormone replacement therapy (postmenopausal): Secondary | ICD-10-CM | POA: Diagnosis not present

## 2022-09-07 DIAGNOSIS — R748 Abnormal levels of other serum enzymes: Secondary | ICD-10-CM | POA: Diagnosis not present

## 2022-09-07 DIAGNOSIS — R030 Elevated blood-pressure reading, without diagnosis of hypertension: Secondary | ICD-10-CM | POA: Insufficient documentation

## 2022-09-07 NOTE — Progress Notes (Signed)
  Subjective:     Patient ID: Courtney Joseph, female   DOB: 06/23/1975, 47 y.o.   MRN: [id number]  HPI Courtney Joseph is a 47 year old white female, divorced, PM in complaining of hot flashes and night sweats and not sleeping. She  is on estrace 2 mg 1 daily and Prometrium 200 mg at HS. She had GB removed 07/30/22 in [place]. LFTs elevated 07/31/22.      Component Value Date/Time   DIAGPAP  02/24/2021 1041    - Negative for intraepithelial lesion or malignancy (NILM)   HPVHIGH Negative 02/24/2021 1041   ADEQPAP  02/24/2021 1041    Satisfactory for evaluation; transformation zone component ABSENT.    PCP is Dr Nadine Counts  Review of Systems +hot flashes, at least 3 a day +night sweats +not sleeping well Reviewed past medical,surgical, social and family history. Reviewed medications and allergies.     Objective:   Physical Exam BP (!) 145/89 (BP Location: Right Arm, Patient Position: Sitting, Cuff Size: Normal)   Pulse 69   Ht 5\' 7"  (1.702 m)   Wt 184 lb (83.5 kg)   BMI 28.82 kg/m  Skin warm and dry. Lungs: clear to ausculation bilaterally. Cardiovascular: regular rate and rhythm.    Fall risk is low  Upstream - 09/07/22 1118       Pregnancy Intention Screening   Does the patient want to become pregnant in the next year? N/A    Does the patient's partner want to become pregnant in the next year? N/A    Would the patient like to discuss contraceptive options today? N/A      Contraception Wrap Up   Current Method No Method - Other Reason   postmenopausal   Reason for No Current Contraceptive Method at Intake ([facility] Only) Other    End Method No Method - Other Reason   postmenopausal   Contraception Counseling Provided No             Assessment:     1. Hot flashes Discussed if LFTs normal could try Veozah, gave handout   2. Night sweats  3. Elevated liver enzymes Will check CMP - Comprehensive metabolic panel  4. Hormone replacement therapy (HRT) Continue estrace and  Prometrium for now  5. Sleep disturbance  6. Elevated BP without diagnosis of hypertension Keep check on BP    Plan:     Will talk when CMP back Follow up prn, TBD

## 2022-09-08 LAB — COMPREHENSIVE METABOLIC PANEL
ALT: 15 IU/L (ref 0–32)
AST: 15 IU/L (ref 0–40)
Albumin: 4.1 g/dL (ref 3.9–4.9)
Alkaline Phosphatase: 70 IU/L (ref 44–121)
BUN/Creatinine Ratio: 19 (ref 9–23)
BUN: 12 mg/dL (ref 6–24)
Bilirubin Total: 0.2 mg/dL (ref 0.0–1.2)
CO2: 25 mmol/L (ref 20–29)
Calcium: 9.3 mg/dL (ref 8.7–10.2)
Chloride: 104 mmol/L (ref 96–106)
Creatinine, Ser: 0.64 mg/dL (ref 0.57–1.00)
Globulin, Total: 2.5 g/dL (ref 1.5–4.5)
Glucose: 84 mg/dL (ref 70–99)
Potassium: 4.4 mmol/L (ref 3.5–5.2)
Sodium: 141 mmol/L (ref 134–144)
Total Protein: 6.6 g/dL (ref 6.0–8.5)
eGFR: 108 mL/min/{1.73_m2} (ref >59)

## 2022-09-17 ENCOUNTER — Other Ambulatory Visit: Payer: Self-pay | Admitting: Adult Health

## 2022-09-17 MED ORDER — VEOZAH 45 MG PO TABS: 1.0000 | ORAL_TABLET | Freq: Every day | ORAL | 2 refills | Status: DC

## 2022-09-17 NOTE — Progress Notes (Signed)
Rx veozah

## 2022-09-22 ENCOUNTER — Telehealth: Payer: Self-pay | Admitting: *Deleted

## 2022-09-22 ENCOUNTER — Other Ambulatory Visit: Payer: Self-pay | Admitting: Family Medicine

## 2022-09-22 DIAGNOSIS — G43001 Migraine without aura, not intractable, with status migrainosus: Secondary | ICD-10-CM

## 2022-09-22 DIAGNOSIS — F339 Major depressive disorder, recurrent, unspecified: Secondary | ICD-10-CM

## 2022-09-22 DIAGNOSIS — J3089 Other allergic rhinitis: Secondary | ICD-10-CM

## 2022-09-22 MED ORDER — VEOZAH 45 MG PO TABS: 1.0000 | ORAL_TABLET | Freq: Every day | ORAL | 0 refills | Status: DC

## 2022-09-22 MED ORDER — FLUTICASONE PROPIONATE 50 MCG/ACT NA SUSP: 2.0000 | Freq: Every day | NASAL | 0 refills | Status: DC

## 2022-09-22 MED ORDER — ESCITALOPRAM OXALATE 20 MG PO TABS
20.0000 mg | ORAL_TABLET | Freq: Every day | ORAL | 0 refills | Status: DC
Start: 2022-09-22 — End: 2023-01-10

## 2022-09-22 MED ORDER — HYOSCYAMINE SULFATE 0.125 MG SL SUBL: 0.1250 mg | SUBLINGUAL_TABLET | Freq: Four times a day (QID) | SUBLINGUAL | 0 refills | Status: DC | PRN

## 2022-09-22 MED ORDER — AIMOVIG 70 MG/ML ~~LOC~~ SOAJ
SUBCUTANEOUS | 0 refills | Status: DC
Start: 2022-09-22 — End: 2023-01-10

## 2022-09-22 MED ORDER — LEVOCETIRIZINE DIHYDROCHLORIDE 5 MG PO TABS
5.0000 mg | ORAL_TABLET | Freq: Every evening | ORAL | 0 refills | Status: DC
Start: 2022-09-22 — End: 2023-01-10

## 2022-09-22 MED ORDER — BUPROPION HCL ER (XL) 300 MG PO TB24
300.0000 mg | ORAL_TABLET | Freq: Every day | ORAL | 0 refills | Status: DC
Start: 2022-09-22 — End: 2023-01-10

## 2022-09-22 NOTE — Telephone Encounter (Signed)
Pt's insurance has approved Veozah 45 mg. Good 09/21/22-09/21/23. Pt and [facility] in [place] aware. JSY

## 2022-09-22 NOTE — Telephone Encounter (Signed)
  Prescription Request  09/22/2022  What is the name of the medication or equipment? ALL NON CONTROLLED MEDS  Have you contacted your pharmacy to request a refill? YES  Which pharmacy would you like this sent to? [facility] [place]  Pt made an appt to see PCP for med refill, but the appt is in December (first available). Pt is aware that she is past due for an appt and has breached her controlled med contract, therefore she will not be given any refills on any meds considered controlled. Pt voiced understanding.    Patient notified that their request is being sent to the clinical staff for review and that they should receive a response within 2 business days.

## 2022-09-23 NOTE — Telephone Encounter (Signed)
Aware refills sent to pharmacy 

## 2022-10-18 ENCOUNTER — Other Ambulatory Visit: Payer: Self-pay | Admitting: Family Medicine

## 2022-11-15 ENCOUNTER — Telehealth: Payer: Self-pay

## 2022-11-15 NOTE — Telephone Encounter (Signed)
Courtney Joseph (Key: [id number]) PA Case ID #: [id number] Need Help? Call us at [phone] Status sent iconSent to Plan today Drug Aimovig 70MG /ML auto-injectors ePA cloud Psychologist, educational Electronic PA Form [phone] NCPDP)

## 2022-11-19 ENCOUNTER — Other Ambulatory Visit: Payer: Self-pay | Admitting: Adult Health

## 2022-11-19 DIAGNOSIS — Z79899 Other long term (current) drug therapy: Secondary | ICD-10-CM

## 2022-11-19 NOTE — Progress Notes (Signed)
Check CMP about November 18,2024

## 2022-12-07 LAB — COMPREHENSIVE METABOLIC PANEL
ALT: 18 [IU]/L (ref 0–32)
AST: 19 [IU]/L (ref 0–40)
Albumin: 4.6 g/dL (ref 3.9–4.9)
Alkaline Phosphatase: 76 [IU]/L (ref 44–121)
BUN/Creatinine Ratio: 21 (ref 9–23)
BUN: 16 mg/dL (ref 6–24)
Bilirubin Total: 0.3 mg/dL (ref 0.0–1.2)
CO2: 23 mmol/L (ref 20–29)
Calcium: 9.4 mg/dL (ref 8.7–10.2)
Chloride: 103 mmol/L (ref 96–106)
Creatinine, Ser: 0.78 mg/dL (ref 0.57–1.00)
Globulin, Total: 2.5 g/dL (ref 1.5–4.5)
Glucose: 88 mg/dL (ref 70–99)
Potassium: 4.3 mmol/L (ref 3.5–5.2)
Sodium: 142 mmol/L (ref 134–144)
Total Protein: 7.1 g/dL (ref 6.0–8.5)
eGFR: 93 mL/min/{1.73_m2} (ref >59)

## 2022-12-10 ENCOUNTER — Telehealth: Payer: Self-pay | Admitting: Family Medicine

## 2022-12-10 ENCOUNTER — Other Ambulatory Visit: Payer: BC Managed Care – PPO

## 2022-12-10 NOTE — Telephone Encounter (Signed)
Copied from CRM [id number]. Topic: Clinical - Lab/Test Results >> Dec 09, 2022  4:37 PM Tiffany H wrote: Reason for CRM: Patient called to advise that she received a MyChart message indicating that she was due for a lab panel. See 11/19/22 order from Cyril Mourning, NP.

## 2022-12-10 NOTE — Telephone Encounter (Signed)
Patient needs appointment for labs please make

## 2022-12-10 NOTE — Telephone Encounter (Signed)
Copied from CRM [id number]. Topic: Clinical - Lab/Test Results >> Dec 09, 2022  4:37 PM Tiffany H wrote: Reason for CRM: Patient called to advise that she received a MyChart message indicating that she was due for a lab panel. See 11/19/22 order from Cyril Mourning, NP.

## 2022-12-10 NOTE — Telephone Encounter (Signed)
Appt made

## 2022-12-15 ENCOUNTER — Other Ambulatory Visit: Payer: BC Managed Care – PPO

## 2022-12-15 DIAGNOSIS — J3089 Other allergic rhinitis: Secondary | ICD-10-CM

## 2022-12-15 DIAGNOSIS — G43001 Migraine without aura, not intractable, with status migrainosus: Secondary | ICD-10-CM

## 2022-12-15 DIAGNOSIS — F339 Major depressive disorder, recurrent, unspecified: Secondary | ICD-10-CM

## 2022-12-15 DIAGNOSIS — Z6825 Body mass index (BMI) 25.0-25.9, adult: Secondary | ICD-10-CM

## 2022-12-16 LAB — CMP14+EGFR
ALT: 19 [IU]/L (ref 0–32)
AST: 19 [IU]/L (ref 0–40)
Albumin: 4.5 g/dL (ref 3.9–4.9)
Alkaline Phosphatase: 86 [IU]/L (ref 44–121)
BUN/Creatinine Ratio: 14 (ref 9–23)
BUN: 11 mg/dL (ref 6–24)
Bilirubin Total: 0.4 mg/dL (ref 0.0–1.2)
CO2: 23 mmol/L (ref 20–29)
Calcium: 9.6 mg/dL (ref 8.7–10.2)
Chloride: 101 mmol/L (ref 96–106)
Creatinine, Ser: 0.81 mg/dL (ref 0.57–1.00)
Globulin, Total: 2.9 g/dL (ref 1.5–4.5)
Glucose: 80 mg/dL (ref 70–99)
Potassium: 4.5 mmol/L (ref 3.5–5.2)
Sodium: 140 mmol/L (ref 134–144)
Total Protein: 7.4 g/dL (ref 6.0–8.5)
eGFR: 89 mL/min/{1.73_m2} (ref >59)

## 2022-12-16 LAB — TSH: TSH: 0.828 u[IU]/mL (ref 0.450–4.500)

## 2022-12-16 LAB — LIPID PANEL
Chol/HDL Ratio: 3.2 {ratio} (ref 0.0–4.4)
Cholesterol, Total: 161 mg/dL (ref 100–199)
HDL: 51 mg/dL (ref >39)
LDL Chol Calc (NIH): 85 mg/dL (ref 0–99)
Triglycerides: 144 mg/dL (ref 0–149)
VLDL Cholesterol Cal: 25 mg/dL (ref 5–40)

## 2022-12-16 LAB — CBC
Hematocrit: 44 % (ref 34.0–46.6)
Hemoglobin: 13.8 g/dL (ref 11.1–15.9)
MCH: 26.5 pg — ABNORMAL LOW (ref 26.6–33.0)
MCHC: 31.4 g/dL — ABNORMAL LOW (ref 31.5–35.7)
MCV: 85 fL (ref 79–97)
Platelets: 376 10*3/uL (ref 150–450)
RBC: 5.2 x10E6/uL (ref 3.77–5.28)
RDW: 13.2 % (ref 11.7–15.4)
WBC: 7.1 10*3/uL (ref 3.4–10.8)

## 2022-12-22 ENCOUNTER — Encounter: Payer: Self-pay | Admitting: Family Medicine

## 2023-01-03 ENCOUNTER — Other Ambulatory Visit (HOSPITAL_COMMUNITY): Payer: Self-pay

## 2023-01-03 NOTE — Telephone Encounter (Signed)
Pharmacy Patient Advocate Encounter  Received notification from [facility] that Prior Authorization for Aimovig 70mg  has been APPROVED from ? to ?Marland Kitchen Unable to obtain price due to refill too soon rejection, last fill date 12/14/22 next available fill date12/21/24   PA #/Case ID/Reference #: [id number]

## 2023-01-10 ENCOUNTER — Ambulatory Visit (INDEPENDENT_AMBULATORY_CARE_PROVIDER_SITE_OTHER): Payer: BC Managed Care – PPO | Admitting: Family Medicine

## 2023-01-10 ENCOUNTER — Encounter: Payer: Self-pay | Admitting: Family Medicine

## 2023-01-10 VITALS — BP 134/84 | HR 74 | Temp 98.7°F | Ht 67.0 in | Wt 195.0 lb

## 2023-01-10 DIAGNOSIS — E559 Vitamin D deficiency, unspecified: Secondary | ICD-10-CM

## 2023-01-10 DIAGNOSIS — J208 Acute bronchitis due to other specified organisms: Secondary | ICD-10-CM

## 2023-01-10 DIAGNOSIS — B9689 Other specified bacterial agents as the cause of diseases classified elsewhere: Secondary | ICD-10-CM

## 2023-01-10 DIAGNOSIS — G43001 Migraine without aura, not intractable, with status migrainosus: Secondary | ICD-10-CM | POA: Diagnosis not present

## 2023-01-10 DIAGNOSIS — E66811 Obesity, class 1: Secondary | ICD-10-CM | POA: Diagnosis not present

## 2023-01-10 DIAGNOSIS — Z79899 Other long term (current) drug therapy: Secondary | ICD-10-CM

## 2023-01-10 DIAGNOSIS — F339 Major depressive disorder, recurrent, unspecified: Secondary | ICD-10-CM

## 2023-01-10 DIAGNOSIS — E7800 Pure hypercholesterolemia, unspecified: Secondary | ICD-10-CM

## 2023-01-10 DIAGNOSIS — Z1159 Encounter for screening for other viral diseases: Secondary | ICD-10-CM

## 2023-01-10 DIAGNOSIS — N951 Menopausal and female climacteric states: Secondary | ICD-10-CM

## 2023-01-10 DIAGNOSIS — J3089 Other allergic rhinitis: Secondary | ICD-10-CM

## 2023-01-10 LAB — BAYER DCA HB A1C WAIVED: HB A1C (BAYER DCA - WAIVED): 5.4 % (ref 4.8–5.6)

## 2023-01-10 MED ORDER — ACETAMINOPHEN-CODEINE 300-30 MG PO TABS: 1.0000 | ORAL_TABLET | Freq: Four times a day (QID) | ORAL | 0 refills | Status: AC | PRN

## 2023-01-10 MED ORDER — VITAMIN D (ERGOCALCIFEROL) 1.25 MG (50000 UNIT) PO CAPS: 50000.0000 [IU] | ORAL_CAPSULE | ORAL | 3 refills | Status: DC

## 2023-01-10 MED ORDER — LEVOCETIRIZINE DIHYDROCHLORIDE 5 MG PO TABS: 5.0000 mg | ORAL_TABLET | Freq: Every evening | ORAL | 3 refills | Status: DC

## 2023-01-10 MED ORDER — AIMOVIG 70 MG/ML ~~LOC~~ SOAJ: SUBCUTANEOUS | 3 refills | Status: DC

## 2023-01-10 MED ORDER — FLUTICASONE PROPIONATE 50 MCG/ACT NA SUSP: 2.0000 | Freq: Every day | NASAL | 3 refills | Status: AC

## 2023-01-10 MED ORDER — BUPROPION HCL ER (XL) 300 MG PO TB24: 300.0000 mg | ORAL_TABLET | Freq: Every day | ORAL | 3 refills | Status: DC

## 2023-01-10 MED ORDER — UBRELVY 100 MG PO TABS: 100.0000 mg | ORAL_TABLET | Freq: Every day | ORAL | 99 refills | Status: DC | PRN

## 2023-01-10 MED ORDER — ESCITALOPRAM OXALATE 20 MG PO TABS: 20.0000 mg | ORAL_TABLET | Freq: Every day | ORAL | 3 refills | Status: DC

## 2023-01-10 MED ORDER — DOXYCYCLINE HYCLATE 100 MG PO TABS: 100.0000 mg | ORAL_TABLET | Freq: Two times a day (BID) | ORAL | 0 refills | Status: AC

## 2023-01-10 MED ORDER — VEOZAH 45 MG PO TABS: 1.0000 | ORAL_TABLET | Freq: Every day | ORAL | 3 refills | Status: DC

## 2023-01-10 NOTE — Progress Notes (Signed)
Subjective: CC: Chronic follow-up PCP: Courtney Ip, DO Courtney Joseph is a 47 y.o. female presenting to clinic today for:  1.  Migraine headaches She has been doing fairly well with Aimovig.  She has some level of headache pretty regularly but no severe migraine headaches except for once or twice per month.  She has a couple of tablets of Tylenol 3 that was prescribed last year and she uses this for those severe episodes.  She admits that she is probably not sleeping or hydrating well enough because there is been a lot of stress at work due to insufficient staffing.  Has previous failed triptans, Nurtec.  2.  Anxiety and depression Reports increased rest as above.  Compliant with Wellbutrin and Lexapro and needs refills  3.  Hot flashes due to menopause Previously treated with estradiol and progesterone by OB and they switched over to [facility].  She notes that the hot flashes are much more tolerable than they had been.  Wishes to continue medication.  Needs refills  4.  URI She reports that she has been having some brown discoloration in her sputum.  She has had nasal congestion that is refractory to Xyzal and Flonase.  No measured fevers.  No shortness of breath or chest pain reported   ROS: Per HPI  Allergies  Allergen Reactions   Sulfasalazine Hives   Erythromycin Hives   Keflex [Cephalexin] Hives   Nsaids     Has gastric ulcer   Penicillins Hives   Sulfa Antibiotics Hives   Tolmetin     Has gastric ulcer   Past Medical History:  Diagnosis Date   Anxiety    on meds   Bone spur    Broken ankle 03/2018   Chronic kidney disease    h/o kidney stones   Depression    on meds   Fatty liver    Gastric erosions    Headache    migraines   Seasonal allergies    Vaginal Pap smear, abnormal     Current Outpatient Medications:    Ubrogepant (UBRELVY) 100 MG TABS, Take 1 tablet (100 mg total) by mouth daily as needed (migraine headache)., Disp: 10 tablet, Rfl:  PRN   acetaminophen-codeine (TYLENOL #3) 300-30 MG tablet, Take 1 tablet by mouth every 6 (six) hours as needed for moderate pain (pain score 4-6)., Disp: 20 tablet, Rfl: 0   AIMOVIG 70 MG/ML SOAJ, Inject sub-q once monthly., Disp: 3 mL, Rfl: 3   buPROPion (WELLBUTRIN XL) 300 MG 24 hr tablet, Take 1 tablet (300 mg total) by mouth daily. Replacing 150, Disp: 90 tablet, Rfl: 3   escitalopram (LEXAPRO) 20 MG tablet, Take 1 tablet (20 mg total) by mouth daily., Disp: 90 tablet, Rfl: 3   Fezolinetant (VEOZAH) 45 MG TABS, Take 1 tablet (45 mg total) by mouth daily., Disp: 90 tablet, Rfl: 0   fluticasone (FLONASE) 50 MCG/ACT nasal spray, Place 2 sprays into both nostrils daily., Disp: 16 g, Rfl: 3   hyoscyamine (LEVSIN SL) 0.125 MG SL tablet, Place 1 tablet (0.125 mg total) under the tongue every 6 (six) hours as needed., Disp: 30 tablet, Rfl: 0   levocetirizine (XYZAL) 5 MG tablet, Take 1 tablet (5 mg total) by mouth every evening. To replace Zyrtec, Disp: 90 tablet, Rfl: 3   Vitamin D, Ergocalciferol, (DRISDOL) 1.25 MG (50000 UNIT) CAPS capsule, Take 1 capsule (50,000 Units total) by mouth once a week., Disp: 12 capsule, Rfl: 3 Social History   Socioeconomic History  Marital status: Divorced    Spouse name: Not on file   Number of children: Not on file   Years of education: Not on file   Highest education level: Bachelor's degree (e.g., BA, AB, BS)  Occupational History   Occupation: staff account  Tobacco Use   Smoking status: Former    Current packs/day: 0.00    Types: Cigarettes    Quit date: 10/15/2011    Years since quitting: 11.2   Smokeless tobacco: Never  Vaping Use   Vaping status: Former  Substance and Sexual Activity   Alcohol use: Yes    Comment: Very RARE   Drug use: No   Sexual activity: Yes    Birth control/protection: Surgical    Comment: ablation  Other Topics Concern   Not on file  Social History Narrative   Not on file   Social Drivers of Health   Financial  Resource Strain: Low Risk  (01/07/2023)   Overall Financial Resource Strain (CARDIA)    Difficulty of Paying Living Expenses: Not very hard  Food Insecurity: No Food Insecurity (01/07/2023)   Hunger Vital Sign    Worried About Running Out of Food in the Last Year: Never true    Ran Out of Food in the Last Year: Never true  Transportation Needs: No Transportation Needs (01/07/2023)   PRAPARE - Administrator, Civil Service (Medical): No    Lack of Transportation (Non-Medical): No  Physical Activity: Inactive (01/07/2023)   Exercise Vital Sign    Days of Exercise per Week: 1 day    Minutes of Exercise per Session: 0 min  Stress: Stress Concern Present (01/07/2023)   [employer] of Occupational Health - Occupational Stress Questionnaire    Feeling of Stress : To some extent  Social Connections: Socially Isolated (01/07/2023)   Social Connection and Isolation Panel [NHANES]    Frequency of Communication with Friends and Family: Twice a week    Frequency of Social Gatherings with Friends and Family: Never    Attends Religious Services: Never    Database administrator or Organizations: No    Attends Banker Meetings: Never    Marital Status: Divorced  Catering manager Violence: Not At Risk (03/02/2022)   Humiliation, Afraid, Rape, and Kick questionnaire    Fear of Current or Ex-Partner: No    Emotionally Abused: No    Physically Abused: No    Sexually Abused: No   Family History  Problem Relation Age of Onset   Hypertension Mother    Colon polyps Father 75   Depression Father    Hypertension Father    Depression Brother    Learning disabilities Brother    Cancer Maternal Aunt    Diabetes Paternal Uncle    Early death Maternal Grandfather    Heart disease Maternal Grandfather    Cancer Paternal Grandmother    Depression Paternal Grandmother    Diabetes Paternal Grandmother    Heart disease Paternal Grandfather    Colon cancer Neg Hx     Esophageal cancer Neg Hx    Prostate cancer Neg Hx    Rectal cancer Neg Hx    Stomach cancer Neg Hx    Sleep apnea Neg Hx     Objective: Office vital signs reviewed. BP 134/84   Pulse 74   Temp 98.7 F (37.1 C)   Ht 5\' 7"  (1.702 m)   Wt 195 lb (88.5 kg)   SpO2 95%   BMI 30.54 kg/m  Physical Examination:  General: Awake, alert, well nourished, No acute distress HEENT: Sclera white.  Moist mucous membranes.  No gross rhinorrhea Cardio: regular rate and rhythm, S1S2 heard, no murmurs appreciated Pulm: clear to auscultation bilaterally, no wheezes, rhonchi or rales; normal work of breathing on room air Neuro: No focal neurologic deficits     01/10/2023    1:19 PM 01/10/2023    1:11 PM 03/02/2022    3:48 PM  Depression screen PHQ 2/9  Decreased Interest 1 0 1  Down, Depressed, Hopeless 0 0 0  PHQ - 2 Score 1 0 1  Altered sleeping 2 1 2   Tired, decreased energy 0 1 1  Change in appetite 1 0 0  Feeling bad or failure about yourself  0 0 0  Trouble concentrating 1 0 0  Moving slowly or fidgety/restless 0 0 0  Suicidal thoughts 0 0 0  PHQ-9 Score 5 2 4   Difficult doing work/chores Somewhat difficult        01/10/2023    1:19 PM 01/10/2023    1:11 PM 03/02/2022    3:48 PM 09/04/2021    2:42 PM  GAD 7 : Generalized Anxiety Score  Nervous, Anxious, on Edge 0 0 0 0  Control/stop worrying 1 0 0 0  Worry too much - different things 1 0 0 0  Trouble relaxing 0 0 1 1  Restless 0 0 0 0  Easily annoyed or irritable 1 0 0 1  Afraid - awful might happen 0 0 0 0  Total GAD 7 Score 3 0 1 2  Anxiety Difficulty    Somewhat difficult    Assessment/ Plan: 47 y.o. female   Migraine without aura and with status migrainosus, not intractable - Plan: AIMOVIG 70 MG/ML SOAJ, acetaminophen-codeine (TYLENOL #3) 300-30 MG tablet, Ubrogepant (UBRELVY) 100 MG TABS  Pure hypercholesterolemia - Plan: Lipid panel, CMP14+EGFR, TSH, TSH, CMP14+EGFR, Lipid panel  Obesity (BMI 30.0-34.9) -  Plan: Bayer DCA Hb A1c Waived, Bayer DCA Hb A1c Waived  Depression, recurrent (HCC) - Plan: buPROPion (WELLBUTRIN XL) 300 MG 24 hr tablet, escitalopram (LEXAPRO) 20 MG tablet  Non-seasonal allergic rhinitis due to other allergic trigger - Plan: fluticasone (FLONASE) 50 MCG/ACT nasal spray, levocetirizine (XYZAL) 5 MG tablet  Vitamin D insufficiency - Plan: Vitamin D, Ergocalciferol, (DRISDOL) 1.25 MG (50000 UNIT) CAPS capsule, VITAMIN D 25 Hydroxy (Vit-D Deficiency, Fractures), VITAMIN D 25 Hydroxy (Vit-D Deficiency, Fractures)  Encounter for hepatitis C screening test for low risk patient - Plan: Hepatitis C antibody, Hepatitis C antibody  Hot flashes due to menopause - Plan: Fezolinetant (VEOZAH) 45 MG TABS  Need for hepatitis C screening test - Plan: CANCELED: Hepatitis C Antibody  Controlled substance agreement signed - Plan: ToxASSURE Select 13 (MW), Urine  Acute bacterial bronchitis - Plan: doxycycline (VIBRA-TABS) 100 MG tablet  All medications have been renewed.  UDS and CSA are up-to-date as per office policy for the Tylenol 3.  I am going to trial on Ubrelvy to see if perhaps this might help her eliminate need for ongoing opioid as needed  Nonfasting labs were collected today.  She will continue all medications as prescribed for now.  I gave her a coupon for [facility] for the hot flashes that have been refractory to hormone replacement by OB/GYN  Doxycycline sent in for presumed bacterial infection.  Home care instructions reviewed and reasons for reevaluation discussed.  Follow-up in 1 year for annual physical, sooner if concerns arise   Courtney Ip, DO  [facility] [phone]

## 2023-01-11 LAB — LIPID PANEL
Chol/HDL Ratio: 3.4 {ratio} (ref 0.0–4.4)
Cholesterol, Total: 173 mg/dL (ref 100–199)
HDL: 51 mg/dL (ref >39)
LDL Chol Calc (NIH): 98 mg/dL (ref 0–99)
Triglycerides: 136 mg/dL (ref 0–149)
VLDL Cholesterol Cal: 24 mg/dL (ref 5–40)

## 2023-01-11 LAB — CMP14+EGFR
ALT: 23 [IU]/L (ref 0–32)
AST: 21 [IU]/L (ref 0–40)
Albumin: 4.6 g/dL (ref 3.9–4.9)
Alkaline Phosphatase: 79 [IU]/L (ref 44–121)
BUN/Creatinine Ratio: 17 (ref 9–23)
BUN: 13 mg/dL (ref 6–24)
Bilirubin Total: 0.3 mg/dL (ref 0.0–1.2)
CO2: 25 mmol/L (ref 20–29)
Calcium: 9.5 mg/dL (ref 8.7–10.2)
Chloride: 102 mmol/L (ref 96–106)
Creatinine, Ser: 0.78 mg/dL (ref 0.57–1.00)
Globulin, Total: 2.4 g/dL (ref 1.5–4.5)
Glucose: 79 mg/dL (ref 70–99)
Potassium: 4.1 mmol/L (ref 3.5–5.2)
Sodium: 142 mmol/L (ref 134–144)
Total Protein: 7 g/dL (ref 6.0–8.5)
eGFR: 93 mL/min/{1.73_m2} (ref >59)

## 2023-01-11 LAB — VITAMIN D 25 HYDROXY (VIT D DEFICIENCY, FRACTURES): Vit D, 25-Hydroxy: 42.6 ng/mL (ref 30.0–100.0)

## 2023-01-11 LAB — HEPATITIS C ANTIBODY: Hep C Virus Ab: NONREACTIVE

## 2023-01-11 LAB — TSH: TSH: 0.7 u[IU]/mL (ref 0.450–4.500)

## 2023-01-14 LAB — TOXASSURE SELECT 13 (MW), URINE

## 2023-02-07 ENCOUNTER — Encounter: Payer: Self-pay | Admitting: Pharmacy Technician

## 2023-02-07 ENCOUNTER — Other Ambulatory Visit (HOSPITAL_COMMUNITY): Payer: Self-pay

## 2023-02-07 NOTE — Telephone Encounter (Signed)
Pharmacy Patient Advocate Encounter   Received notification from CoverMyMeds that prior authorization for Ubrelvy 100MG  tablets is required/requested.   Insurance verification completed.   The patient is insured through [facility] .   Per test claim: PA required; PA submitted to above mentioned insurance via CoverMyMeds Key/confirmation #/EOC [id number] Status is pending

## 2023-02-08 ENCOUNTER — Other Ambulatory Visit (HOSPITAL_COMMUNITY): Payer: Self-pay

## 2023-02-09 ENCOUNTER — Other Ambulatory Visit (HOSPITAL_COMMUNITY): Payer: Self-pay

## 2023-02-16 ENCOUNTER — Other Ambulatory Visit (HOSPITAL_COMMUNITY): Payer: Self-pay

## 2023-02-16 ENCOUNTER — Telehealth: Payer: Self-pay

## 2023-02-16 NOTE — Telephone Encounter (Signed)
Pharmacy Patient Advocate Encounter   Received notification from CoverMyMeds that prior authorization for Ubrelvy 100MG  tablets is required/requested.   Insurance verification completed.   The patient is insured through [facility] .   Per test claim: PA required; PA submitted to above mentioned insurance via CoverMyMeds Key/confirmation #/EOC [id number] Status is pending

## 2023-02-16 NOTE — Telephone Encounter (Signed)
Pharmacy Patient Advocate Encounter   Received notification from  [facility] Portal that prior authorization for Ubrelvy 100MG  tablets is required/requested.   Insurance verification completed.   The patient is insured through [facility] .   Per test claim: PA required; PA started via CoverMyMeds. KEY [id number] . Waiting for clinical questions to populate.

## 2023-02-17 ENCOUNTER — Other Ambulatory Visit (HOSPITAL_COMMUNITY): Payer: Self-pay | Admitting: Adult Health

## 2023-02-17 ENCOUNTER — Other Ambulatory Visit (HOSPITAL_COMMUNITY): Payer: Self-pay

## 2023-02-17 DIAGNOSIS — Z1231 Encounter for screening mammogram for malignant neoplasm of breast: Secondary | ICD-10-CM

## 2023-02-17 NOTE — Telephone Encounter (Signed)
Pharmacy Patient Advocate Encounter  Received notification from [facility] that Prior Authorization for Ubrelvy 100MG  tablets  has been APPROVED from 02/16/2023 to 02/16/2024. Ran test claim, Copay is $0.00. This test claim was processed through [facility]- copay amounts may vary at other pharmacies due to pharmacy/plan contracts, or as the patient moves through the different stages of their insurance plan.   PA #/Case ID/Reference #: [id number]

## 2023-03-02 ENCOUNTER — Ambulatory Visit (HOSPITAL_COMMUNITY): Payer: Self-pay

## 2023-03-11 ENCOUNTER — Ambulatory Visit (HOSPITAL_COMMUNITY)
Admission: RE | Admit: 2023-03-11 | Discharge: 2023-03-11 | Disposition: A | Discharge status: Home / Self Care | Payer: 59 | Source: Ambulatory Visit | Attending: Adult Health | Admitting: Adult Health

## 2023-03-11 DIAGNOSIS — Z1231 Encounter for screening mammogram for malignant neoplasm of breast: Secondary | ICD-10-CM | POA: Insufficient documentation

## 2023-03-22 ENCOUNTER — Telehealth: Payer: Self-pay | Admitting: Family Medicine

## 2023-03-22 ENCOUNTER — Encounter: Payer: Self-pay | Admitting: Family Medicine

## 2023-03-22 ENCOUNTER — Telehealth: Payer: Self-pay

## 2023-03-22 ENCOUNTER — Other Ambulatory Visit (HOSPITAL_COMMUNITY): Payer: Self-pay

## 2023-03-22 NOTE — Telephone Encounter (Signed)
 Copied from CRM [id number]. Topic: Clinical - Prescription Issue >> Mar 22, 2023 11:18 AM Tiffany B wrote: Reason for CRM: Patient was informed by pharmacy AIMOVIG 70 MG/ML SOAJ is in need of a  prior authorization. Patient is due for this shot tomorrow and would like PA expedited. Patient current insurance is [employer]

## 2023-03-22 NOTE — Telephone Encounter (Signed)
 Urgent PA request has been Submitted. New Encounter has been or will be created for follow up. For additional info see Pharmacy Prior Auth telephone encounter from 03/22/23.

## 2023-03-22 NOTE — Telephone Encounter (Signed)
 Pharmacy Patient Advocate Encounter   Received notification from [employer] that prior authorization for Aimovig 70MG /ML auto-injectors is required/requested.   Insurance verification completed.   The patient is insured through [facility] .   Per test claim: PA required; PA submitted to above mentioned insurance via CoverMyMeds Key/confirmation #/EOC [facility] Status is pending

## 2023-03-24 ENCOUNTER — Other Ambulatory Visit (HOSPITAL_COMMUNITY): Payer: Self-pay

## 2023-03-24 NOTE — Telephone Encounter (Signed)
 Pharmacy Patient Advocate Encounter  Received notification from [facility] that Prior Authorization for  Aimovig 70MG /ML auto-injectors  has been APPROVED from 03/21/23 to 03/20/24. Unable to obtain price due to refill too soon rejection, last fill date 03/23/23 next available fill date03/30/25   PA #/Case ID/Reference #: [id number]

## 2023-03-24 NOTE — Telephone Encounter (Signed)
 Patient notified.Marland Kitchen LS

## 2023-03-30 ENCOUNTER — Encounter: Payer: Self-pay | Admitting: Adult Health

## 2023-03-30 ENCOUNTER — Ambulatory Visit (INDEPENDENT_AMBULATORY_CARE_PROVIDER_SITE_OTHER): Payer: Self-pay | Admitting: Adult Health

## 2023-03-30 VITALS — BP 143/94 | HR 70 | Ht 67.0 in | Wt 195.5 lb

## 2023-03-30 DIAGNOSIS — Z01419 Encounter for gynecological examination (general) (routine) without abnormal findings: Secondary | ICD-10-CM

## 2023-03-30 DIAGNOSIS — Z1211 Encounter for screening for malignant neoplasm of colon: Secondary | ICD-10-CM | POA: Diagnosis not present

## 2023-03-30 DIAGNOSIS — Z1331 Encounter for screening for depression: Secondary | ICD-10-CM | POA: Diagnosis not present

## 2023-03-30 DIAGNOSIS — R232 Flushing: Secondary | ICD-10-CM

## 2023-03-30 DIAGNOSIS — I10 Essential (primary) hypertension: Secondary | ICD-10-CM

## 2023-03-30 DIAGNOSIS — Z79899 Other long term (current) drug therapy: Secondary | ICD-10-CM

## 2023-03-30 LAB — HEMOCCULT GUIAC POC 1CARD (OFFICE): Fecal Occult Blood, POC: NEGATIVE

## 2023-03-30 MED ORDER — HYDROCHLOROTHIAZIDE 12.5 MG PO CAPS: 12.5000 mg | ORAL_CAPSULE | Freq: Every day | ORAL | 6 refills | Status: DC

## 2023-03-30 NOTE — Progress Notes (Signed)
 Patient ID: Courtney Joseph, female   DOB: 22-Jan-1975, 48 y.o.   MRN: [id number] History of Present Illness: Courtney Joseph is a 48 year old white female, divorced,G1P1001 in for a well woman gyn exam.     Component Value Date/Time   DIAGPAP  02/24/2021 1041    - Negative for intraepithelial lesion or malignancy (NILM)   HPVHIGH Negative 02/24/2021 1041   ADEQPAP  02/24/2021 1041    Satisfactory for evaluation; transformation zone component ABSENT.   PCP is Dr Nadine Counts    Current Medications, Allergies, Past Medical History, Past Surgical History, Family History and Social History were reviewed in [employer] electronic medical record.     Review of Systems: Patient denies any headaches, hearing loss, fatigue, blurred vision, shortness of breath, chest pain, abdominal pain, problems with bowel movements, urination, or intercourse.(Not active). No joint pain or mood swings.  Denies any vaginal bleeding Still has few hot flashes on veozah    Physical Exam:BP (!) 143/94 (BP Location: Left Arm, Patient Position: Sitting, Cuff Size: Normal)   Pulse 70   Ht 5\' 7"  (1.702 m)   Wt 195 lb 8 oz (88.7 kg)   BMI 30.62 kg/m   General:  Well developed, well nourished, no acute distress Skin:  Warm and dry Neck:  Midline trachea, normal thyroid, good ROM, no lymphadenopathy Lungs; Clear to auscultation bilaterally Breast:  No dominant palpable mass, retraction, or nipple discharge Cardiovascular: Regular rate and rhythm Abdomen:  Soft, non tender, no hepatosplenomegaly Pelvic:  External genitalia is normal in appearance, no lesions.  The vagina is normal in appearance. Urethra has no lesions or masses. The cervix is smooth.  Uterus is felt to be normal size, shape, and contour.  No adnexal masses or tenderness noted.Bladder is non tender, no masses felt. Rectal: Good sphincter tone, no polyps, or hemorrhoids felt.  Hemoccult negative. Extremities/musculoskeletal:  No swelling or varicosities noted,  no clubbing or cyanosis Psych:  No mood changes, alert and cooperative,seems happy AA is 1 Fall risk is low    03/30/2023    8:52 AM 01/10/2023    1:19 PM 01/10/2023    1:11 PM  Depression screen PHQ 2/9  Decreased Interest 0 1 0  Down, Depressed, Hopeless 0 0 0  PHQ - 2 Score 0 1 0  Altered sleeping 2 2 1   Tired, decreased energy 2 0 1  Change in appetite 0 1 0  Feeling bad or failure about yourself  0 0 0  Trouble concentrating 0 1 0  Moving slowly or fidgety/restless 0 0 0  Suicidal thoughts 0 0 0  PHQ-9 Score 4 5 2   Difficult doing work/chores  Somewhat difficult        03/30/2023    8:53 AM 01/10/2023    1:19 PM 01/10/2023    1:11 PM 03/02/2022    3:48 PM  GAD 7 : Generalized Anxiety Score  Nervous, Anxious, on Edge 1 0 0 0  Control/stop worrying 0 1 0 0  Worry too much - different things 0 1 0 0  Trouble relaxing 2 0 0 1  Restless 1 0 0 0  Easily annoyed or irritable 0 1 0 0  Afraid - awful might happen 0 0 0 0  Total GAD 7 Score 4 3 0 1      Upstream - 03/30/23 0857       Pregnancy Intention Screening   Does the patient want to become pregnant in the next year? N/A    Does  the patient's partner want to become pregnant in the next year? N/A    Would the patient like to discuss contraceptive options today? N/A      Contraception Wrap Up   Current Method Abstinence   ablation   End Method Abstinence   ablation   Contraception Counseling Provided No             Examination chaperoned by Malachy Mood LPN  Impression and Plan: 1. Encounter for well woman exam with routine gynecological exam (Primary) Pap and physical in 1 year Mammogram was negative 03/11/23 Labs with PCP Cologuard due in October 2025 2. Encounter for screening fecal occult blood testing Hemoccult was negative   3. High risk medication use On veozah, check CMP - Comprehensive metabolic panel  4. Hot flashes Still has a few   5. Hypertension, unspecified type BP was elevated  again, will rx Microzide 12.5 mg 1 daily Meds ordered this encounter  Medications   hydrochlorothiazide (MICROZIDE) 12.5 MG capsule    Sig: Take 1 capsule (12.5 mg total) by mouth daily.    Dispense:  30 capsule    Refill:  6    Supervising Provider:   Lazaro Arms [[id number]]    Watch salt,does now already, dad is on lasix Has appt with PCP in 2 weeks check BP then

## 2023-03-31 LAB — COMPREHENSIVE METABOLIC PANEL
ALT: 18 IU/L (ref 0–32)
AST: 17 IU/L (ref 0–40)
Albumin: 4.6 g/dL (ref 3.9–4.9)
Alkaline Phosphatase: 92 IU/L (ref 44–121)
BUN/Creatinine Ratio: 14 (ref 9–23)
BUN: 13 mg/dL (ref 6–24)
Bilirubin Total: 0.3 mg/dL (ref 0.0–1.2)
CO2: 23 mmol/L (ref 20–29)
Calcium: 10 mg/dL (ref 8.7–10.2)
Chloride: 101 mmol/L (ref 96–106)
Creatinine, Ser: 0.9 mg/dL (ref 0.57–1.00)
Globulin, Total: 2.6 g/dL (ref 1.5–4.5)
Glucose: 96 mg/dL (ref 70–99)
Potassium: 4.5 mmol/L (ref 3.5–5.2)
Sodium: 142 mmol/L (ref 134–144)
Total Protein: 7.2 g/dL (ref 6.0–8.5)
eGFR: 78 mL/min/{1.73_m2} (ref >59)

## 2023-04-11 ENCOUNTER — Ambulatory Visit: Payer: BC Managed Care – PPO | Admitting: Family Medicine

## 2023-04-11 ENCOUNTER — Encounter: Payer: Self-pay | Admitting: Family Medicine

## 2023-04-11 VITALS — BP 124/73 | HR 69 | Temp 98.7°F | Ht 67.0 in | Wt 196.0 lb

## 2023-04-11 DIAGNOSIS — I10 Essential (primary) hypertension: Secondary | ICD-10-CM

## 2023-04-11 DIAGNOSIS — G43001 Migraine without aura, not intractable, with status migrainosus: Secondary | ICD-10-CM

## 2023-04-11 DIAGNOSIS — Z23 Encounter for immunization: Secondary | ICD-10-CM

## 2023-04-11 NOTE — Addendum Note (Signed)
 Addended by: Waynette Buttery on: 04/11/2023 04:14 PM   Modules accepted: Orders

## 2023-04-11 NOTE — Progress Notes (Signed)
 Subjective: CC: Follow-up migraine headaches PCP: Raliegh Ip, DO ZOX:WRUEAV Marschall is a 48 y.o. female presenting to clinic today for:  1.  Migraine headaches Continues to use the Aimovig as prescribed monthly.  She notes really good response to the Primrose.  She only uses the Tylenol 3 in severe cases but has not needed to use them thus far because Bernita Raisin has helped her abort all migraine headaches.  2.  Hypertension She was recently started on HCTZ by her OB/GYN.  She notes blood pressures have been ranging above goal pretty consistently.  She does not always have a headache when this occurs.  However, after further discussion she is not sure that she has been utilizing the blood pressure meter appropriately.  She reports that she had a cramp in her left thigh but this was not sustained.  She does report increased urine output.   ROS: Per HPI  Allergies  Allergen Reactions   Sulfasalazine Hives   Amoxicillin Hives   Erythromycin Hives   Keflex [Cephalexin] Hives   Nsaids     Has gastric ulcer   Penicillins Hives   Sulfa Antibiotics Hives   Tolmetin     Has gastric ulcer   Past Medical History:  Diagnosis Date   Anxiety    on meds   Bone spur    Broken ankle 03/2018   Chronic kidney disease    h/o kidney stones   Depression    on meds   Fatty liver    Gastric erosions    Headache    migraines   Seasonal allergies    Vaginal Pap smear, abnormal     Current Outpatient Medications:    acetaminophen-codeine (TYLENOL #3) 300-30 MG tablet, Take 1 tablet by mouth every 6 (six) hours as needed for moderate pain (pain score 4-6)., Disp: 20 tablet, Rfl: 0   AIMOVIG 70 MG/ML SOAJ, Inject sub-q once monthly., Disp: 3 mL, Rfl: 3   buPROPion (WELLBUTRIN XL) 300 MG 24 hr tablet, Take 1 tablet (300 mg total) by mouth daily. Replacing 150, Disp: 90 tablet, Rfl: 3   escitalopram (LEXAPRO) 20 MG tablet, Take 1 tablet (20 mg total) by mouth daily., Disp: 90 tablet, Rfl:  3   Fezolinetant (VEOZAH) 45 MG TABS, Take 1 tablet (45 mg total) by mouth daily., Disp: 90 tablet, Rfl: 3   fluticasone (FLONASE) 50 MCG/ACT nasal spray, Place 2 sprays into both nostrils daily., Disp: 16 g, Rfl: 3   hydrochlorothiazide (MICROZIDE) 12.5 MG capsule, Take 1 capsule (12.5 mg total) by mouth daily., Disp: 30 capsule, Rfl: 6   hyoscyamine (LEVSIN SL) 0.125 MG SL tablet, Place 1 tablet (0.125 mg total) under the tongue every 6 (six) hours as needed., Disp: 30 tablet, Rfl: 0   levocetirizine (XYZAL) 5 MG tablet, Take 1 tablet (5 mg total) by mouth every evening. To replace Zyrtec, Disp: 90 tablet, Rfl: 3   Ubrogepant (UBRELVY) 100 MG TABS, Take 1 tablet (100 mg total) by mouth daily as needed (migraine headache)., Disp: 10 tablet, Rfl: PRN   Vitamin D, Ergocalciferol, (DRISDOL) 1.25 MG (50000 UNIT) CAPS capsule, Take 1 capsule (50,000 Units total) by mouth once a week., Disp: 12 capsule, Rfl: 3 Social History   Socioeconomic History   Marital status: Divorced    Spouse name: Not on file   Number of children: Not on file   Years of education: Not on file   Highest education level: Bachelor's degree (e.g., BA, AB, BS)  Occupational History  Occupation: staff account  Tobacco Use   Smoking status: Former    Current packs/day: 0.00    Types: Cigarettes    Quit date: 10/15/2011    Years since quitting: 11.4   Smokeless tobacco: Never  Vaping Use   Vaping status: Former  Substance and Sexual Activity   Alcohol use: Yes    Comment: Very RARE   Drug use: No   Sexual activity: Not Currently    Birth control/protection: Surgical    Comment: ablation  Other Topics Concern   Not on file  Social History Narrative   Not on file   Social Drivers of Health   Financial Resource Strain: Low Risk  (03/30/2023)   Overall Financial Resource Strain (CARDIA)    Difficulty of Paying Living Expenses: Not hard at all  Food Insecurity: No Food Insecurity (03/30/2023)   Hunger Vital Sign     Worried About Running Out of Food in the Last Year: Never true    Ran Out of Food in the Last Year: Never true  Transportation Needs: No Transportation Needs (03/30/2023)   PRAPARE - Administrator, Civil Service (Medical): No    Lack of Transportation (Non-Medical): No  Physical Activity: Inactive (03/30/2023)   Exercise Vital Sign    Days of Exercise per Week: 0 days    Minutes of Exercise per Session: 0 min  Stress: Stress Concern Present (03/30/2023)   [employer] of Occupational Health - Occupational Stress Questionnaire    Feeling of Stress : Very much  Social Connections: Socially Isolated (03/30/2023)   Social Connection and Isolation Panel [NHANES]    Frequency of Communication with Friends and Family: More than three times a week    Frequency of Social Gatherings with Friends and Family: More than three times a week    Attends Religious Services: Never    Database administrator or Organizations: No    Attends Banker Meetings: Never    Marital Status: Divorced  Catering manager Violence: Not At Risk (03/30/2023)   Humiliation, Afraid, Rape, and Kick questionnaire    Fear of Current or Ex-Partner: No    Emotionally Abused: No    Physically Abused: No    Sexually Abused: No   Family History  Problem Relation Age of Onset   Hypertension Mother    Colon polyps Father 95   Depression Father    Hypertension Father    Depression Brother    Learning disabilities Brother    Cancer Maternal Aunt    Diabetes Paternal Uncle    Early death Maternal Grandfather    Heart disease Maternal Grandfather    Cancer Paternal Grandmother    Depression Paternal Grandmother    Diabetes Paternal Grandmother    Heart disease Paternal Grandfather    Colon cancer Neg Hx    Esophageal cancer Neg Hx    Prostate cancer Neg Hx    Rectal cancer Neg Hx    Stomach cancer Neg Hx    Sleep apnea Neg Hx     Objective: Office vital signs reviewed. BP 124/73   Pulse  69   Temp 98.7 F (37.1 C)   Ht 5\' 7"  (1.702 m)   Wt 196 lb (88.9 kg)   SpO2 96%   BMI 30.70 kg/m   Physical Examination:  General: Awake, alert, well nourished, No acute distress HEENT: sclera white, MMM Cardio: regular rate and rhythm, S1S2 heard, no murmurs appreciated Pulm: clear to auscultation bilaterally, no wheezes, rhonchi  or rales; normal work of breathing on room air  Assessment/ Plan: 48 y.o. female   Migraine without aura and with status migrainosus, not intractable  Primary hypertension  Ubrelvy samples provided.  This seems to be working well and hopefully this means that we can abandon Tylenol 3 going forward for migraine headaches  Blood pressure controlled today.  I encouraged her to check the integrity of her meter and we discussed again how to measure blood pressures appropriately.  Shingles vaccination administered  Raliegh Ip, DO [facility] [phone]

## 2023-04-11 NOTE — Patient Instructions (Signed)
 How to Take Your Blood Pressure Blood pressure measures how strongly your blood is pressing against the walls of your arteries. Arteries are blood vessels that carry blood from your heart throughout your body. You can take your blood pressure at home with a machine. You may need to check your blood pressure at home: To check if you have high blood pressure (hypertension). To check your blood pressure over time. To make sure your blood pressure medicine is working. Supplies needed: Blood pressure machine, or monitor. A chair to sit in. This should be a chair where you can sit upright with your back supported. Do not sit on a soft couch or an armchair. Table or desk. Small notebook. Pencil or pen. How to prepare Avoid these things for 30 minutes before checking your blood pressure: Having drinks with caffeine in them, such as coffee or tea. Drinking alcohol. Eating. Smoking. Exercising. Do these things five minutes before checking your blood pressure: Go to the bathroom and pee (urinate). Sit in a chair. Be quiet. Do not talk. How to take your blood pressure Follow the instructions that came with your machine. If you have a digital blood pressure monitor, these may be the instructions: Sit up straight. Place your feet on the floor. Do not cross your ankles or legs. Rest your left arm at the level of your heart. You may rest it on a table, desk, or chair. Pull up your shirt sleeve. Wrap the blood pressure cuff around the upper part of your left arm. The cuff should be 1 inch (2.5 cm) above your elbow. It is best to wrap the cuff around bare skin. Fit the cuff snugly around your arm, but not too tightly. You should be able to place only one finger between the cuff and your arm. Place the cord so that it rests in the bend of your elbow. Press the power button. Sit quietly while the cuff fills with air and loses air. Write down the numbers on the screen. Wait 2-3 minutes and then repeat  steps 1-10. What do the numbers mean? Two numbers make up your blood pressure. The first number is called systolic pressure. The second is called diastolic pressure. An example of a blood pressure reading is "120 over 80" (or 120/80). If you are an adult and do not have a medical condition, use this guide to find out if your blood pressure is normal: Normal First number: below 120. Second number: below 80. Elevated First number: 120-129. Second number: below 80. Hypertension stage 1 First number: 130-139. Second number: 80-89. Hypertension stage 2 First number: 140 or above. Second number: 90 or above. Your blood pressure is above normal even if only the first or only the second number is above normal. Follow these instructions at home: Medicines Take over-the-counter and prescription medicines only as told by your doctor. Tell your doctor if your medicine is causing side effects. General instructions Check your blood pressure as often as your doctor tells you to. Check your blood pressure at the same time every day. Take your monitor to your next doctor's appointment. Your doctor will: Make sure you are using it correctly. Make sure it is working right. Understand what your blood pressure numbers should be. Keep all follow-up visits. General tips You will need a blood pressure machine or monitor. Your doctor can suggest a monitor. You can buy one at a drugstore or online. When choosing one: Choose one with an arm cuff. Choose one that wraps around your  upper arm. Only one finger should fit between your arm and the cuff. Do not choose one that measures your blood pressure from your wrist or finger. Where to find more information American Heart Association: www.heart.org Contact a doctor if: Your blood pressure keeps being high. Your blood pressure is suddenly low. Get help right away if: Your first blood pressure number is higher than 180. Your second blood pressure number is  higher than 120. These symptoms may be an emergency. Do not wait to see if the symptoms will go away. Get help right away. Call 911. Summary Check your blood pressure at the same time every day. Avoid caffeine, alcohol, smoking, and exercise for 30 minutes before checking your blood pressure. Make sure you understand what your blood pressure numbers should be. This information is not intended to replace advice given to you by your health care provider. Make sure you discuss any questions you have with your health care provider. Document Revised: 09/18/2020 Document Reviewed: 09/18/2020 Elsevier Patient Education  2024 [employer].

## 2023-05-03 ENCOUNTER — Telehealth (INDEPENDENT_AMBULATORY_CARE_PROVIDER_SITE_OTHER): Admitting: Nurse Practitioner

## 2023-05-03 ENCOUNTER — Encounter: Payer: Self-pay | Admitting: Nurse Practitioner

## 2023-05-03 DIAGNOSIS — R109 Unspecified abdominal pain: Secondary | ICD-10-CM | POA: Insufficient documentation

## 2023-05-03 NOTE — Progress Notes (Signed)
Virtual Visit via video Note Due to COVID-19 pandemic this visit was conducted virtually. This visit type was conducted due to national recommendations for restrictions regarding the COVID-19 Pandemic (e.g. social distancing, sheltering in place) in an effort to limit this patient's exposure and mitigate transmission in our community. All issues noted in this document were discussed and addressed.  A physical exam was not performed with this format.   I connected with Courtney Joseph on 05/03/2023 at 1125 by name and DOB and verified that I am speaking with the correct person using two identifiers. Sheli Dorin is currently located at home  during visit. The provider, Martina Sinner, DNP is located in their office at time of visit.  I discussed the limitations, risks, security and privacy concerns of performing an evaluation and management service by virtual visit and the availability of in person appointments. I also discussed with the patient that there may be a patient responsible charge related to this service. The patient expressed understanding and agreed to proceed.  Subjective:  Patient ID: Courtney Joseph, female    DOB: 08/10/1975, 48 y.o.   MRN: [id number]  Chief Complaint:  Back Pain (" Was seen at an urgent and suggested she get a CT scan to r/o Kidney stones, not currently on any pain")   HPI: Courtney Joseph is a 48 y.o. female presenting on 05/03/2023 for Back Pain (" Was seen at an urgent and suggested she get a CT scan to r/o Kidney stones, not currently on any pain")   Courtney Joseph is an adult female who presents for follow-up of right flank pain initially evaluated on Friday. At that time, she had lab work and an X-ray performed. Her urinalysis showed trace blood, and she was advised to go to the ED for a CT scan to rule out possible kidney stones. However, she declined to go to the ED due to a high $300 co-pay, and expressed belief that the pain was more likely due to a pulled  muscle.  She reports that she took Aleve and applied a lidocaine patch before going to bed Saturday night. By Sunday morning, she noted significant improvement, and today she reports no pain, no nausea, no vomiting, and no fever.  Relevant past medical, surgical, family, and social history reviewed and updated as indicated.  Allergies and medications reviewed and updated.   Past Medical History:  Diagnosis Date   Anxiety    on meds   Bone spur    Broken ankle 03/2018   Chronic kidney disease    h/o kidney stones   Depression    on meds   Fatty liver    Gastric erosions    Headache    migraines   Seasonal allergies    Vaginal Pap smear, abnormal     Past Surgical History:  Procedure Laterality Date   BONE EXCISION Left 10/30/2015   Procedure: EXCISION OF LEFT CALCANEOUS BONE SPUR AT ANTERIOR PROCESS;  Surgeon: Toni Arthurs, MD;  Location: [facility];  Service: Orthopedics;  Laterality: Left;   GALLBLADDER SURGERY  07/30/2022   LASER ABLATION/CAUTERIZATION OF ENDOMETRIAL IMPLANTS     due to heavy bleeding   WISDOM TOOTH EXTRACTION      Social History   Socioeconomic History   Marital status: Divorced    Spouse name: Not on file   Number of children: Not on file   Years of education: Not on file   Highest education level: Bachelor's degree (e.g., BA, AB, BS)  Occupational History   Occupation: staff account  Tobacco Use   Smoking status: Former    Current packs/day: 0.00    Types: Cigarettes    Quit date: 10/15/2011    Years since quitting: 11.5   Smokeless tobacco: Never  Vaping Use   Vaping status: Former  Substance and Sexual Activity   Alcohol use: Yes    Comment: Very RARE   Drug use: No   Sexual activity: Not Currently    Birth control/protection: Surgical    Comment: ablation  Other Topics Concern   Not on file  Social History Narrative   Not on file   Social Drivers of Health   Financial Resource Strain: Low Risk  (03/30/2023)    Overall Financial Resource Strain (CARDIA)    Difficulty of Paying Living Expenses: Not hard at all  Food Insecurity: No Food Insecurity (03/30/2023)   Hunger Vital Sign    Worried About Running Out of Food in the Last Year: Never true    Ran Out of Food in the Last Year: Never true  Transportation Needs: No Transportation Needs (03/30/2023)   PRAPARE - Administrator, Civil Service (Medical): No    Lack of Transportation (Non-Medical): No  Physical Activity: Inactive (03/30/2023)   Exercise Vital Sign    Days of Exercise per Week: 0 days    Minutes of Exercise per Session: 0 min  Stress: Stress Concern Present (03/30/2023)   [employer] of Occupational Health - Occupational Stress Questionnaire    Feeling of Stress : Very much  Social Connections: Socially Isolated (03/30/2023)   Social Connection and Isolation Panel [NHANES]    Frequency of Communication with Friends and Family: More than three times a week    Frequency of Social Gatherings with Friends and Family: More than three times a week    Attends Religious Services: Never    Database administrator or Organizations: No    Attends Banker Meetings: Never    Marital Status: Divorced  Catering manager Violence: Not At Risk (03/30/2023)   Humiliation, Afraid, Rape, and Kick questionnaire    Fear of Current or Ex-Partner: No    Emotionally Abused: No    Physically Abused: No    Sexually Abused: No    Outpatient Encounter Medications as of 05/03/2023  Medication Sig   acetaminophen-codeine (TYLENOL #3) 300-30 MG tablet Take 1 tablet by mouth every 6 (six) hours as needed for moderate pain (pain score 4-6).   AIMOVIG 70 MG/ML SOAJ Inject sub-q once monthly.   buPROPion (WELLBUTRIN XL) 300 MG 24 hr tablet Take 1 tablet (300 mg total) by mouth daily. Replacing 150   escitalopram (LEXAPRO) 20 MG tablet Take 1 tablet (20 mg total) by mouth daily.   Fezolinetant (VEOZAH) 45 MG TABS Take 1 tablet (45 mg  total) by mouth daily.   fluticasone (FLONASE) 50 MCG/ACT nasal spray Place 2 sprays into both nostrils daily.   hydrochlorothiazide (MICROZIDE) 12.5 MG capsule Take 1 capsule (12.5 mg total) by mouth daily.   hyoscyamine (LEVSIN SL) 0.125 MG SL tablet Place 1 tablet (0.125 mg total) under the tongue every 6 (six) hours as needed.   levocetirizine (XYZAL) 5 MG tablet Take 1 tablet (5 mg total) by mouth every evening. To replace Zyrtec   Ubrogepant (UBRELVY) 100 MG TABS Take 1 tablet (100 mg total) by mouth daily as needed (migraine headache).   Vitamin D, Ergocalciferol, (DRISDOL) 1.25 MG (50000 UNIT) CAPS capsule Take 1 capsule (  50,000 Units total) by mouth once a week.   No facility-administered encounter medications on file as of 05/03/2023.    Allergies  Allergen Reactions   Sulfasalazine Hives   Amoxicillin Hives   Erythromycin Hives   Keflex [Cephalexin] Hives   Nsaids     Has gastric ulcer   Penicillins Hives   Sulfa Antibiotics Hives   Tolmetin     Has gastric ulcer    Review of Systems  Constitutional:  Negative for activity change, appetite change, chills and fatigue.  HENT:  Negative for congestion, nosebleeds and sinus pain.   Respiratory:  Negative for apnea, cough and shortness of breath.   Cardiovascular:  Negative for chest pain and palpitations.  Gastrointestinal:  Negative for constipation, nausea and vomiting.  Genitourinary:  Negative for dysuria, flank pain and urgency.  Musculoskeletal:  Negative for back pain.  Skin:  Negative for color change and rash.  Neurological:  Negative for dizziness and headaches.    Observations/Objective: No vital signs or physical exam, this was a virtual health encounter.  Pt alert and oriented, answers all questions appropriately, and able to speak in full sentences.   Physical Exam HENT:     Head: Normocephalic and atraumatic.     Nose: Nose normal.  Eyes:     General: No scleral icterus.    Extraocular Movements:  Extraocular movements intact.     Pupils: Pupils are equal, round, and reactive to light.  Pulmonary:     Effort: Pulmonary effort is normal.  Skin:    General: Skin is warm and dry.     Findings: No rash.  Neurological:     Mental Status: She is oriented to person, place, and time.  Psychiatric:        Mood and Affect: Mood normal.        Behavior: Behavior normal.        Thought Content: Thought content normal.        Judgment: Judgment normal.      Assessment and Plan: Courtney Joseph was seen today for back pain.  Diagnoses and all orders for this visit:  Right flank pain   Courtney Joseph is a 25-year-old Caucasian female seen today via telehealth for right flank pain, no acute distress  Plan: Explained to client since her pain is resolved no CT scan will be ordered today, if symptoms return she will reach out to the office Continue Aleve and lidocaine patch as needed  Follow Up Instructions: Return if symptoms worsen or fail to improve.    I discussed the assessment and treatment plan with the patient. The patient was provided an opportunity to ask questions and all were answered. The patient agreed with the plan and demonstrated an understanding of the instructions.   The patient was advised to call back or seek an in-person evaluation if the symptoms worsen or if the condition fails to improve as anticipated.  The above assessment and management plan was discussed with the patient. The patient verbalized understanding of and has agreed to the management plan. Patient is aware to call the clinic if they develop any new symptoms or if symptoms persist or worsen. Patient is aware when to return to the clinic for a follow-up visit. Patient educated on when it is appropriate to go to the emergency department.    I provided 12 minutes of time during this video encounter.   Martina Sinner, [place] [facility] [address] [place], [place] [address] [phone]  [phone] 05/03/2023  

## 2023-06-16 ENCOUNTER — Encounter: Payer: Self-pay | Admitting: Family Medicine

## 2023-06-17 ENCOUNTER — Ambulatory Visit: Admitting: Family Medicine

## 2023-06-21 ENCOUNTER — Other Ambulatory Visit: Payer: Self-pay | Admitting: Adult Health

## 2023-06-21 DIAGNOSIS — Z79899 Other long term (current) drug therapy: Secondary | ICD-10-CM

## 2023-06-21 NOTE — Progress Notes (Signed)
Ck CMP 

## 2023-07-06 LAB — COMPREHENSIVE METABOLIC PANEL WITH GFR
ALT: 13 IU/L (ref 0–32)
AST: 15 IU/L (ref 0–40)
Albumin: 4.4 g/dL (ref 3.9–4.9)
Alkaline Phosphatase: 94 IU/L (ref 44–121)
BUN/Creatinine Ratio: 22 (ref 9–23)
BUN: 14 mg/dL (ref 6–24)
Bilirubin Total: 0.3 mg/dL (ref 0.0–1.2)
CO2: 23 mmol/L (ref 20–29)
Calcium: 9.9 mg/dL (ref 8.7–10.2)
Chloride: 101 mmol/L (ref 96–106)
Creatinine, Ser: 0.64 mg/dL (ref 0.57–1.00)
Globulin, Total: 2.3 g/dL (ref 1.5–4.5)
Glucose: 95 mg/dL (ref 70–99)
Potassium: 4 mmol/L (ref 3.5–5.2)
Sodium: 141 mmol/L (ref 134–144)
Total Protein: 6.7 g/dL (ref 6.0–8.5)
eGFR: 108 mL/min/{1.73_m2} (ref >59)

## 2023-07-07 ENCOUNTER — Ambulatory Visit: Payer: Self-pay | Admitting: Adult Health

## 2023-10-05 ENCOUNTER — Other Ambulatory Visit: Payer: Self-pay | Admitting: Adult Health

## 2023-10-05 DIAGNOSIS — Z79899 Other long term (current) drug therapy: Secondary | ICD-10-CM

## 2023-10-05 NOTE — Progress Notes (Signed)
 Check CMP, on veozah 

## 2023-10-12 ENCOUNTER — Encounter: Payer: Self-pay | Admitting: Family Medicine

## 2023-10-12 ENCOUNTER — Ambulatory Visit: Admitting: Family Medicine

## 2023-10-12 VITALS — BP 127/79 | HR 78 | Temp 97.8°F | Ht 67.0 in | Wt 201.2 lb

## 2023-10-12 DIAGNOSIS — H6991 Unspecified Eustachian tube disorder, right ear: Secondary | ICD-10-CM

## 2023-10-12 DIAGNOSIS — G479 Sleep disorder, unspecified: Secondary | ICD-10-CM

## 2023-10-12 DIAGNOSIS — G4733 Obstructive sleep apnea (adult) (pediatric): Secondary | ICD-10-CM

## 2023-10-12 DIAGNOSIS — I10 Essential (primary) hypertension: Secondary | ICD-10-CM

## 2023-10-12 DIAGNOSIS — G43001 Migraine without aura, not intractable, with status migrainosus: Secondary | ICD-10-CM | POA: Diagnosis not present

## 2023-10-12 DIAGNOSIS — Z23 Encounter for immunization: Secondary | ICD-10-CM

## 2023-10-12 DIAGNOSIS — E559 Vitamin D deficiency, unspecified: Secondary | ICD-10-CM

## 2023-10-12 DIAGNOSIS — Z789 Other specified health status: Secondary | ICD-10-CM

## 2023-10-12 DIAGNOSIS — F339 Major depressive disorder, recurrent, unspecified: Secondary | ICD-10-CM

## 2023-10-12 MED ORDER — VITAMIN D (ERGOCALCIFEROL) 1.25 MG (50000 UNIT) PO CAPS: 50000.0000 [IU] | ORAL_CAPSULE | ORAL | 3 refills | Status: DC

## 2023-10-12 MED ORDER — BUPROPION HCL ER (XL) 300 MG PO TB24: 300.0000 mg | ORAL_TABLET | Freq: Every day | ORAL | 3 refills | Status: DC

## 2023-10-12 MED ORDER — AIMOVIG 70 MG/ML ~~LOC~~ SOAJ: SUBCUTANEOUS | 3 refills | Status: AC

## 2023-10-12 MED ORDER — UBRELVY 100 MG PO TABS: 100.0000 mg | ORAL_TABLET | Freq: Every day | ORAL | 99 refills | Status: AC | PRN

## 2023-10-12 MED ORDER — HYDROCHLOROTHIAZIDE 12.5 MG PO CAPS: 12.5000 mg | ORAL_CAPSULE | Freq: Every day | ORAL | 3 refills | Status: DC

## 2023-10-12 MED ORDER — ESCITALOPRAM OXALATE 20 MG PO TABS: 20.0000 mg | ORAL_TABLET | Freq: Every day | ORAL | 3 refills | Status: DC

## 2023-10-12 NOTE — Patient Instructions (Signed)
 You have known sleep apnea based on sleep study done in 2023.  It may be worth pursuing a CPAP machine to see if this would help with your sleep issues.   We can certainly use sleep meds but I want you to have controlled sleep apnea before we use them because they can be dangerous if you are not concomitantly treating the sleep apnea  Sleep Apnea  Sleep apnea is a condition that affects your breathing while you are sleeping. Your tongue or soft tissue in your throat may block the flow of air while you sleep. You may have shallow breathing or stop breathing for short periods of time. People with sleep apnea may snore loudly. There are three kinds of sleep apnea: Obstructive sleep apnea. This kind is caused by a blocked or collapsed airway. This is the most common. Central sleep apnea. This kind happens when the part of the brain that controls breathing does not send the correct signals to the muscles that control breathing. Mixed sleep apnea. This is a combination of obstructive and central sleep apnea. What are the causes? The most common cause of sleep apnea is a collapsed or blocked airway. What increases the risk? Being very overweight. Having family members with sleep apnea. Having a tongue or tonsils that are larger than normal. Having a small airway or jaw problems. Being older. What are the signs or symptoms? Loud snoring. Restless sleep. Trouble staying asleep. Being sleepy or tired during the day. Waking up gasping or choking. Having a headache in the morning. Mood swings. Having a hard time remembering things and concentrating. How is this diagnosed? A medical history. A physical exam. A sleep study. This is also called a polysomnography test. This test is done at a sleep lab or in your home while you are sleeping. How is this treated? Treatment may include: Sleeping on your side. Losing weight if you're overweight. Wearing an oral appliance. This is a mouthpiece that  moves your lower jaw forward. Using a positive airway pressure (PAP) device to keep your airways open while you sleep, such as: A continuous positive airway pressure (CPAP) device. This device gives forced air through a mask when you breathe out. This keeps your airways open. A bilevel positive airway pressure (BIPAP) device. This device gives forced air through a mask when you breathe in and when you breathe out to keep your airways open. Having surgery if other treatments do not work. If your sleep apnea is not treated, you may be at risk for: Heart failure. Heart attack. Stroke. Type 2 diabetes or a problem with your blood sugar called insulin resistance. Follow these instructions at home: Medicines Take your medicines only as told by your health care provider. Avoid alcohol, medicines to help you relax, and certain pain medicines. These may make sleep apnea worse. General instructions Do not smoke, vape, or use products with nicotine or tobacco in them. If you need help quitting, talk with your provider. If you were given a PAP device to open your airway while you sleep, use it as told by your provider. If you're having surgery, make sure to tell your provider you have sleep apnea. You may need to bring your PAP device with you. Contact a health care provider if: The PAP device that you were given to use during sleep bothers you or does not seem to be working. You do not feel better or you feel worse. Get help right away if: You have trouble breathing. You have  chest pain. You have trouble talking. One side of your body feels weak. A part of your face is hanging down. These symptoms may be an emergency. Call 911 right away. Do not wait to see if the symptoms will go away. Do not drive yourself to the hospital. This information is not intended to replace advice given to you by your health care provider. Make sure you discuss any questions you have with your health care  provider. Document Revised: 10/07/2022 Document Reviewed: 03/11/2022 Elsevier Patient Education  2024 [employer].

## 2023-10-12 NOTE — Progress Notes (Signed)
 Subjective: CC:ear fullness, 6m f/u PCP: Jolinda Norene HERO, DO YEP:Courtney Joseph is a 48 y.o. female presenting to clinic today for:  Ear fullness Reports right sided ear fullness. Feels like she has fluid on the ear.  She denies drainage, fevers, chills or other concerning URI features.  She notes that she went back to his Zyrtec  as this seems to be working better than Xyzal  and has been utilizing her nasal spray intermittently.  She has not used it consistently enough to see if it will actually help  Migraine headaches She reports good control of migraine headaches with Aimovig  and as needed use of Ubrelvy .  She actually switched from Dr. Pepper to [employer] and that seemed to make a big difference in the frequency of her headaches.  Sleep difficulties Patient with history of mild obstructive sleep apnea diagnosed in 2023 on sleep study with Dr. Buck.  She has not proceeded with any type of CPAP machine prior to today because it was unaffordable through her old insurance.  She is willing to go ahead and proceed with this again because she is experiencing excessive daytime sleepiness such that she can sit down after work and just goes to sleep.   ROS: Per HPI  Allergies  Allergen Reactions   Sulfasalazine Hives   Amoxicillin Hives   Erythromycin Hives   Keflex [Cephalexin] Hives   Nsaids     Has gastric ulcer   Penicillins Hives   Sulfa Antibiotics Hives   Tolmetin     Has gastric ulcer   Past Medical History:  Diagnosis Date   Anxiety    on meds   Bone spur    Broken ankle 03/2018   Chronic kidney disease    h/o kidney stones   Depression    on meds   Fatty liver    Gastric erosions    Headache    migraines   Seasonal allergies    Vaginal Pap smear, abnormal     Current Outpatient Medications:    acetaminophen -codeine  (TYLENOL  #3) 300-30 MG tablet, Take 1 tablet by mouth every 6 (six) hours as needed for moderate pain (pain score 4-6)., Disp: 20 tablet, Rfl:  0   AIMOVIG  70 MG/ML SOAJ, Inject sub-q once monthly., Disp: 3 mL, Rfl: 3   buPROPion  (WELLBUTRIN  XL) 300 MG 24 hr tablet, Take 1 tablet (300 mg total) by mouth daily. Replacing 150, Disp: 90 tablet, Rfl: 3   escitalopram  (LEXAPRO ) 20 MG tablet, Take 1 tablet (20 mg total) by mouth daily., Disp: 90 tablet, Rfl: 3   Fezolinetant  (VEOZAH ) 45 MG TABS, Take 1 tablet (45 mg total) by mouth daily., Disp: 90 tablet, Rfl: 3   fluticasone  (FLONASE ) 50 MCG/ACT nasal spray, Place 2 sprays into both nostrils daily., Disp: 16 g, Rfl: 3   hydrochlorothiazide  (MICROZIDE ) 12.5 MG capsule, Take 1 capsule (12.5 mg total) by mouth daily., Disp: 30 capsule, Rfl: 6   levocetirizine (XYZAL ) 5 MG tablet, Take 1 tablet (5 mg total) by mouth every evening. To replace Zyrtec , Disp: 90 tablet, Rfl: 3   Ubrogepant  (UBRELVY ) 100 MG TABS, Take 1 tablet (100 mg total) by mouth daily as needed (migraine headache)., Disp: 10 tablet, Rfl: PRN   Vitamin D , Ergocalciferol , (DRISDOL ) 1.25 MG (50000 UNIT) CAPS capsule, Take 1 capsule (50,000 Units total) by mouth once a week., Disp: 12 capsule, Rfl: 3   hyoscyamine  (LEVSIN  SL) 0.125 MG SL tablet, Place 1 tablet (0.125 mg total) under the tongue every 6 (six) hours as  needed. (Patient not taking: Reported on 10/12/2023), Disp: 30 tablet, Rfl: 0 Social History   Socioeconomic History   Marital status: Divorced    Spouse name: Not on file   Number of children: Not on file   Years of education: Not on file   Highest education level: Bachelor's degree (e.g., BA, AB, BS)  Occupational History   Occupation: staff account  Tobacco Use   Smoking status: Former    Current packs/day: 0.00    Types: Cigarettes    Quit date: 10/15/2011    Years since quitting: 12.0   Smokeless tobacco: Never  Vaping Use   Vaping status: Former  Substance and Sexual Activity   Alcohol use: Yes    Comment: Very RARE   Drug use: No   Sexual activity: Not Currently    Birth control/protection: Surgical     Comment: ablation  Other Topics Concern   Not on file  Social History Narrative   Not on file   Social Drivers of Health   Financial Resource Strain: Low Risk  (03/30/2023)   Overall Financial Resource Strain (CARDIA)    Difficulty of Paying Living Expenses: Not hard at all  Food Insecurity: No Food Insecurity (03/30/2023)   Hunger Vital Sign    Worried About Running Out of Food in the Last Year: Never true    Ran Out of Food in the Last Year: Never true  Transportation Needs: No Transportation Needs (03/30/2023)   PRAPARE - Administrator, Civil Service (Medical): No    Lack of Transportation (Non-Medical): No  Physical Activity: Inactive (03/30/2023)   Exercise Vital Sign    Days of Exercise per Week: 0 days    Minutes of Exercise per Session: 0 min  Stress: Stress Concern Present (03/30/2023)   [employer] of Occupational Health - Occupational Stress Questionnaire    Feeling of Stress : Very much  Social Connections: Socially Isolated (03/30/2023)   Social Connection and Isolation Panel    Frequency of Communication with Friends and Family: More than three times a week    Frequency of Social Gatherings with Friends and Family: More than three times a week    Attends Religious Services: Never    Database administrator or Organizations: No    Attends Banker Meetings: Never    Marital Status: Divorced  Catering manager Violence: Not At Risk (03/30/2023)   Humiliation, Afraid, Rape, and Kick questionnaire    Fear of Current or Ex-Partner: No    Emotionally Abused: No    Physically Abused: No    Sexually Abused: No   Family History  Problem Relation Age of Onset   Hypertension Mother    Colon polyps Father 19   Depression Father    Hypertension Father    Depression Brother    Learning disabilities Brother    Cancer Maternal Aunt    Diabetes Paternal Uncle    Early death Maternal Grandfather    Heart disease Maternal Grandfather    Cancer  Paternal Grandmother    Depression Paternal Grandmother    Diabetes Paternal Grandmother    Heart disease Paternal Grandfather    Colon cancer Neg Hx    Esophageal cancer Neg Hx    Prostate cancer Neg Hx    Rectal cancer Neg Hx    Stomach cancer Neg Hx    Sleep apnea Neg Hx     Objective: Office vital signs reviewed. BP 127/79   Pulse 78   Temp  97.8 F (36.6 C)   Ht 5' 7 (1.702 m)   Wt 201 lb 4 oz (91.3 kg)   SpO2 97%   BMI 31.52 kg/m   Physical Examination:  General: Awake, alert, obese, No acute distress HEENT: Right TM with dulled light reflex and clear effusion noted behind the TM.  Left TM normal and intact Neck: 38 cm in circumference. Cardio: regular rate and rhythm, S1S2 heard, no murmurs appreciated Pulm: clear to auscultation bilaterally, no wheezes, rhonchi or rales; normal work of breathing on room air Neuro: Oriented.  Assessment/ Plan: 48 y.o. female   Eustachian tube dysfunction, right  Mild obstructive sleep apnea in adult - Plan: For home use only DME continuous positive airway pressure (CPAP), CANCELED: Ambulatory referral to Sleep Studies  Sleep difficulties - Plan: For home use only DME continuous positive airway pressure (CPAP), CANCELED: Ambulatory referral to Sleep Studies  Migraine without aura and with status migrainosus, not intractable - Plan: Ubrogepant  (UBRELVY ) 100 MG TABS, AIMOVIG  70 MG/ML SOAJ  Primary hypertension  Hepatitis B vaccination status unknown - Plan: Hepatitis B surface antibody,quantitative  Need for shingles vaccine  Depression, recurrent - Plan: buPROPion  (WELLBUTRIN  XL) 300 MG 24 hr tablet, escitalopram  (LEXAPRO ) 20 MG tablet  Vitamin D  insufficiency - Plan: Vitamin D , Ergocalciferol , (DRISDOL ) 1.25 MG (50000 UNIT) CAPS capsule   Physical exam consistent with eustachian tube dysfunction.  No evidence of secondary infection.  Discussed OMT maneuver for eustachian tube drainage and encouraged use of both Flonase  and  Zyrtec   Patient with history of mild obstructive sleep apnea.  I canceled referral back to sleep studies, as I think she simply just needs to get a CPAP machine so I am going to get this ordered for her and utilize her 2023 AutoPap instructions.  Rx given to [facility] and she will contact her with where she can pick this up  Migraine headaches are stable with Aimovig  and Ubrelvy .  Future orders for hepatitis B screening for immunity  Second shingles vaccination administered  Depression chronic and stable.  Meds renewed  Vitamin D  renewed.  Will collect labs at her physical in December   Isay Perleberg M Jolinda, DO [facility] [phone]

## 2023-10-13 ENCOUNTER — Ambulatory Visit: Payer: Self-pay | Admitting: Adult Health

## 2023-10-13 LAB — COMPREHENSIVE METABOLIC PANEL WITH GFR
ALT: 16 IU/L (ref 0–32)
AST: 15 IU/L (ref 0–40)
Albumin: 4.6 g/dL (ref 3.9–4.9)
Alkaline Phosphatase: 96 IU/L (ref 41–116)
BUN/Creatinine Ratio: 18 (ref 9–23)
BUN: 16 mg/dL (ref 6–24)
Bilirubin Total: 0.3 mg/dL (ref 0.0–1.2)
CO2: 22 mmol/L (ref 20–29)
Calcium: 10 mg/dL (ref 8.7–10.2)
Chloride: 100 mmol/L (ref 96–106)
Creatinine, Ser: 0.88 mg/dL (ref 0.57–1.00)
Globulin, Total: 2.8 g/dL (ref 1.5–4.5)
Glucose: 91 mg/dL (ref 70–99)
Potassium: 4.2 mmol/L (ref 3.5–5.2)
Sodium: 140 mmol/L (ref 134–144)
Total Protein: 7.4 g/dL (ref 6.0–8.5)
eGFR: 80 mL/min/1.73 (ref >59)

## 2023-10-14 NOTE — Progress Notes (Signed)
 Pt aware order for CPAP was sent to [facility]. Below is response that I got when I sent it in by community message and I let the pt know this as well. everything has been pulled and sent if for processing, but it may be denied due to the sleep study being over 50 years old, I did pull the notes that explain the reason for the delay between testing and setup but I will let you know if there is an issue. Pt stated that if it was denied to forget order, that it was too expensive for her to pay OOP

## 2023-10-28 ENCOUNTER — Encounter: Payer: Self-pay | Admitting: Family Medicine

## 2023-11-08 ENCOUNTER — Telehealth: Payer: Self-pay | Admitting: *Deleted

## 2023-11-08 NOTE — Telephone Encounter (Signed)
 Reached out to Tammi local rep w/ [facility] RE: fax I rcvd on pt requesting new sleep study, one that was received is over 50 yrs old, done 12/31/20. Per Tammi SS has to be done w/n 1 yr of getting a new CPAP machine. CPAP was unaffordable after she had the SS done in 2022, now she is experiencing excessive daytime sleepiness such that she can sit down after work and just goes to sleep and is interesting in proceeding w/ a CPAP machine. [facility] will reach out to patient offering her private pay option and let her know this is her option since her sleep study is over a year old. She may contact our office if she would like to repeat a SS in order to go through her insurance.

## 2023-11-22 ENCOUNTER — Encounter: Payer: Self-pay | Admitting: Family Medicine

## 2023-11-25 ENCOUNTER — Ambulatory Visit: Admitting: Family Medicine

## 2023-11-25 VITALS — BP 135/82 | HR 81 | Temp 98.2°F | Ht 67.0 in | Wt 203.5 lb

## 2023-11-25 DIAGNOSIS — I10 Essential (primary) hypertension: Secondary | ICD-10-CM | POA: Diagnosis not present

## 2023-11-25 MED ORDER — LOSARTAN POTASSIUM-HCTZ 50-12.5 MG PO TABS: 1.0000 | ORAL_TABLET | Freq: Every day | ORAL | 0 refills | Status: AC

## 2023-11-25 NOTE — Progress Notes (Signed)
 Subjective: CC: Blood pressures PCP: Jolinda Norene HERO, DO YEP:Xmpdup Terwilliger is a 48 y.o. female presenting to clinic today for:  Patient reports that she is been having elevations in her blood pressures for the last couple of weeks.  She denies any changes in diet, over-the-counter medications etc.  She has had a little bit of a dry cough but has been utilizing medicines that do not impact blood pressure.  She notes she has been compliant with hydrochlorothiazide  12.5 mg daily.  She has underlying migraine headaches.  She has been monitoring blood pressures with a new blood pressure meter and she still getting consistently 150s over 90s and it does not matter what time of day that is collected.  On Tuesday it was 140/101 and this morning 172/95   ROS: Per HPI  Allergies  Allergen Reactions   Sulfasalazine Hives   Amoxicillin Hives   Erythromycin Hives   Keflex [Cephalexin] Hives   Nsaids     Has gastric ulcer   Penicillins Hives   Sulfa Antibiotics Hives   Tolmetin     Has gastric ulcer   Past Medical History:  Diagnosis Date   Anxiety    on meds   Bone spur    Broken ankle 03/2018   Chronic kidney disease    h/o kidney stones   Depression    on meds   Fatty liver    Gastric erosions    Headache    migraines   Seasonal allergies    Vaginal Pap smear, abnormal     Current Outpatient Medications:    acetaminophen -codeine  (TYLENOL  #3) 300-30 MG tablet, Take 1 tablet by mouth every 6 (six) hours as needed for moderate pain (pain score 4-6)., Disp: 20 tablet, Rfl: 0   AIMOVIG  70 MG/ML SOAJ, Inject sub-q once monthly., Disp: 3 mL, Rfl: 3   buPROPion  (WELLBUTRIN  XL) 300 MG 24 hr tablet, Take 1 tablet (300 mg total) by mouth daily. Replacing 150, Disp: 90 tablet, Rfl: 3   cetirizine  (ZYRTEC ) 10 MG tablet, Take 10 mg by mouth daily., Disp: , Rfl:    escitalopram  (LEXAPRO ) 20 MG tablet, Take 1 tablet (20 mg total) by mouth daily., Disp: 90 tablet, Rfl: 3   fluticasone   (FLONASE ) 50 MCG/ACT nasal spray, Place 2 sprays into both nostrils daily., Disp: 16 g, Rfl: 3   hydrochlorothiazide  (MICROZIDE ) 12.5 MG capsule, Take 1 capsule (12.5 mg total) by mouth daily., Disp: 90 capsule, Rfl: 3   Ubrogepant  (UBRELVY ) 100 MG TABS, Take 1 tablet (100 mg total) by mouth daily as needed (migraine headache)., Disp: 10 tablet, Rfl: PRN   Vitamin D , Ergocalciferol , (DRISDOL ) 1.25 MG (50000 UNIT) CAPS capsule, Take 1 capsule (50,000 Units total) by mouth once a week., Disp: 12 capsule, Rfl: 3   Fezolinetant  (VEOZAH ) 45 MG TABS, Take 1 tablet (45 mg total) by mouth daily., Disp: 90 tablet, Rfl: 3   hyoscyamine  (LEVSIN  SL) 0.125 MG SL tablet, Place 1 tablet (0.125 mg total) under the tongue every 6 (six) hours as needed. (Patient not taking: Reported on 10/12/2023), Disp: 30 tablet, Rfl: 0 Social History   Socioeconomic History   Marital status: Divorced    Spouse name: Not on file   Number of children: Not on file   Years of education: Not on file   Highest education level: Bachelor's degree (e.g., BA, AB, BS)  Occupational History   Occupation: staff account  Tobacco Use   Smoking status: Former    Current packs/day: 0.00  Types: Cigarettes    Quit date: 10/15/2011    Years since quitting: 12.1   Smokeless tobacco: Never  Vaping Use   Vaping status: Former  Substance and Sexual Activity   Alcohol use: Yes    Comment: Very RARE   Drug use: No   Sexual activity: Not Currently    Birth control/protection: Surgical    Comment: ablation  Other Topics Concern   Not on file  Social History Narrative   Not on file   Social Drivers of Health   Financial Resource Strain: Patient Declined (11/25/2023)   Overall Financial Resource Strain (CARDIA)    Difficulty of Paying Living Expenses: Patient declined  Food Insecurity: Patient Declined (11/25/2023)   Hunger Vital Sign    Worried About Running Out of Food in the Last Year: Patient declined    Ran Out of Food in the  Last Year: Patient declined  Transportation Needs: No Transportation Needs (11/25/2023)   PRAPARE - Administrator, Civil Service (Medical): No    Lack of Transportation (Non-Medical): No  Physical Activity: Unknown (11/25/2023)   Exercise Vital Sign    Days of Exercise per Week: Patient declined    Minutes of Exercise per Session: Not on file  Stress: Patient Declined (11/25/2023)   [employer] of Occupational Health - Occupational Stress Questionnaire    Feeling of Stress: Patient declined  Social Connections: Unknown (11/25/2023)   Social Connection and Isolation Panel    Frequency of Communication with Friends and Family: Patient declined    Frequency of Social Gatherings with Friends and Family: Patient declined    Attends Religious Services: Patient declined    Database Administrator or Organizations: Patient declined    Attends Banker Meetings: Not on file    Marital Status: Patient declined  Intimate Partner Violence: Not At Risk (03/30/2023)   Humiliation, Afraid, Rape, and Kick questionnaire    Fear of Current or Ex-Partner: No    Emotionally Abused: No    Physically Abused: No    Sexually Abused: No   Family History  Problem Relation Age of Onset   Hypertension Mother    Colon polyps Father 31   Depression Father    Hypertension Father    Depression Brother    Learning disabilities Brother    Cancer Maternal Aunt    Diabetes Paternal Uncle    Early death Maternal Grandfather    Heart disease Maternal Grandfather    Cancer Paternal Grandmother    Depression Paternal Grandmother    Diabetes Paternal Grandmother    Heart disease Paternal Grandfather    Colon cancer Neg Hx    Esophageal cancer Neg Hx    Prostate cancer Neg Hx    Rectal cancer Neg Hx    Stomach cancer Neg Hx    Sleep apnea Neg Hx     Objective: Office vital signs reviewed. BP 135/82   Pulse 81   Temp 98.2 F (36.8 C)   Ht 5' 7 (1.702 m)   Wt 203 lb 8 oz (92.3  kg)   SpO2 93%   BMI 31.87 kg/m   Physical Examination:  General: Awake, alert, well nourished, No acute distress HEENT: Nasal turbinates moist.  No active bleeding or discharge appreciated. Cardio: regular rate and rhythm, S1S2 heard, no murmurs appreciated Pulm: clear to auscultation bilaterally, no wheezes, rhonchi or rales; normal work of breathing on room air  Assessment/ Plan: 48 y.o. female   Essential hypertension - Plan:  losartan-hydrochlorothiazide  (HYZAAR) 50-12.5 MG tablet, CMP14+EGFR, CANCELED: Basic metabolic panel with GFR   Normotensive here but home readings are elevated and it sounds like she even had an EMT do a blood pressure check and it was above goal.  For this reason I am adding losartan low-dose and combining it with her hydrochlorothiazide .  Discussed holding her hydrochlorothiazide  as this is built into the current pill.  Will plan to check renal function and electrolytes in 2 weeks with blood pressure check with nurse.  She will continue daily blood pressure checks with goal of less than 140/90.   Norene CHRISTELLA Fielding, DO [facility] [phone]

## 2023-11-25 NOTE — Patient Instructions (Signed)

## 2023-12-09 ENCOUNTER — Ambulatory Visit: Admitting: *Deleted

## 2023-12-09 ENCOUNTER — Other Ambulatory Visit

## 2023-12-09 VITALS — BP 136/80 | HR 76

## 2023-12-09 DIAGNOSIS — I10 Essential (primary) hypertension: Secondary | ICD-10-CM

## 2023-12-09 DIAGNOSIS — Z789 Other specified health status: Secondary | ICD-10-CM

## 2023-12-09 NOTE — Progress Notes (Signed)
 Patient is in office today for a nurse visit for Blood Pressure Check. Patient blood pressure was 136/80, Patient having no symptoms. BP w/ her machine 140/80 p 80

## 2023-12-10 LAB — CMP14+EGFR
ALT: 19 IU/L (ref 0–32)
AST: 18 IU/L (ref 0–40)
Albumin: 4.7 g/dL (ref 3.9–4.9)
Alkaline Phosphatase: 90 IU/L (ref 41–116)
BUN/Creatinine Ratio: 16 (ref 9–23)
BUN: 14 mg/dL (ref 6–24)
Bilirubin Total: 0.3 mg/dL (ref 0.0–1.2)
CO2: 25 mmol/L (ref 20–29)
Calcium: 9.8 mg/dL (ref 8.7–10.2)
Chloride: 99 mmol/L (ref 96–106)
Creatinine, Ser: 0.89 mg/dL (ref 0.57–1.00)
Globulin, Total: 2.7 g/dL (ref 1.5–4.5)
Glucose: 80 mg/dL (ref 70–99)
Potassium: 3.9 mmol/L (ref 3.5–5.2)
Sodium: 139 mmol/L (ref 134–144)
Total Protein: 7.4 g/dL (ref 6.0–8.5)
eGFR: 79 mL/min/1.73 (ref >59)

## 2023-12-10 LAB — HEPATITIS B SURFACE ANTIBODY, QUANTITATIVE: Hepatitis B Surf Ab Quant: <3.5 m[IU]/mL — ABNORMAL LOW

## 2023-12-12 ENCOUNTER — Other Ambulatory Visit: Payer: Self-pay | Admitting: Family Medicine

## 2023-12-12 ENCOUNTER — Ambulatory Visit: Payer: Self-pay | Admitting: Family Medicine

## 2023-12-12 DIAGNOSIS — I10 Essential (primary) hypertension: Secondary | ICD-10-CM

## 2024-01-06 ENCOUNTER — Encounter: Payer: Self-pay | Admitting: Family Medicine

## 2024-01-06 ENCOUNTER — Ambulatory Visit: Payer: Self-pay

## 2024-01-06 VITALS — BP 116/74 | HR 80 | Temp 98.0°F | Ht 67.0 in | Wt 205.4 lb

## 2024-01-06 DIAGNOSIS — Z0000 Encounter for general adult medical examination without abnormal findings: Secondary | ICD-10-CM | POA: Diagnosis not present

## 2024-01-06 DIAGNOSIS — F339 Major depressive disorder, recurrent, unspecified: Secondary | ICD-10-CM | POA: Diagnosis not present

## 2024-01-06 DIAGNOSIS — N644 Mastodynia: Secondary | ICD-10-CM | POA: Diagnosis not present

## 2024-01-06 DIAGNOSIS — I10 Essential (primary) hypertension: Secondary | ICD-10-CM

## 2024-01-06 DIAGNOSIS — G4733 Obstructive sleep apnea (adult) (pediatric): Secondary | ICD-10-CM

## 2024-01-06 DIAGNOSIS — E7800 Pure hypercholesterolemia, unspecified: Secondary | ICD-10-CM

## 2024-01-06 DIAGNOSIS — E559 Vitamin D deficiency, unspecified: Secondary | ICD-10-CM | POA: Diagnosis not present

## 2024-01-06 DIAGNOSIS — E66811 Obesity, class 1: Secondary | ICD-10-CM

## 2024-01-06 DIAGNOSIS — Z789 Other specified health status: Secondary | ICD-10-CM | POA: Diagnosis not present

## 2024-01-06 DIAGNOSIS — J329 Chronic sinusitis, unspecified: Secondary | ICD-10-CM | POA: Diagnosis not present

## 2024-01-06 LAB — LIPID PANEL

## 2024-01-06 LAB — BAYER DCA HB A1C WAIVED: HB A1C (BAYER DCA - WAIVED): 5.6 % (ref 4.8–5.6)

## 2024-01-06 MED ORDER — VITAMIN D (ERGOCALCIFEROL) 1.25 MG (50000 UNIT) PO CAPS: 50000.0000 [IU] | ORAL_CAPSULE | ORAL | 3 refills | Status: AC

## 2024-01-06 MED ORDER — LOSARTAN POTASSIUM-HCTZ 50-12.5 MG PO TABS: 1.0000 | ORAL_TABLET | Freq: Every day | ORAL | 3 refills | Status: AC

## 2024-01-06 MED ORDER — ESCITALOPRAM OXALATE 20 MG PO TABS: 20.0000 mg | ORAL_TABLET | Freq: Every day | ORAL | 3 refills | Status: AC

## 2024-01-06 MED ORDER — MONTELUKAST SODIUM 10 MG PO TABS: 10.0000 mg | ORAL_TABLET | Freq: Every day | ORAL | 3 refills | Status: AC

## 2024-01-06 MED ORDER — BUPROPION HCL ER (XL) 300 MG PO TB24: 300.0000 mg | ORAL_TABLET | Freq: Every day | ORAL | 3 refills | Status: AC

## 2024-01-06 NOTE — Progress Notes (Signed)
 "  Courtney Joseph is a 48 y.o. female presents to office today for annual physical exam examination.    Overall has been doing okay.  Still has some stressors related to her parents health.  She is compliant with all medications and they seem to be working well for her.  She has no concerns today except for some right nipple soreness that is been present for the last few weeks.  Denies any preceding injury.  No skin changes.  No lumps or bumps appreciated.  No unplanned weight loss or night sweats.  Her last mammogram was in February and she is not due again until February 2026.  She does not menstruate.  Migraines have been well-controlled with Aimovig  and Ubrelvy .  She has had no need for Tylenol  3 for migraine headaches.  She has maybe 3 migraines per month and this is drastically reduced from what it used to be.  She does report rhinosinusitis that is not controlled by Flonase  or Zyrtec .  No fevers reported  Marital status: single, Substance use: none Health Maintenance Due  Topic Date Due   Hepatitis B Vaccines 19-59 Average Risk (1 of 3 - 19+ 3-dose series) Never done    Immunization History  Administered Date(s) Administered   Moderna Sars-Covid-2 Vaccination 03/29/2019, 04/28/2019   Tdap 10/18/2000, 07/27/2018   Zoster Recombinant(Shingrix ) 04/11/2023, 10/12/2023   Past Medical History:  Diagnosis Date   Anxiety    on meds   Bone spur    Broken ankle 03/2018   Chronic kidney disease    h/o kidney stones   Depression    on meds   Fatty liver    Gastric erosions    Headache    migraines   Seasonal allergies    Vaginal Pap smear, abnormal    Social History   Socioeconomic History   Marital status: Divorced    Spouse name: Not on file   Number of children: Not on file   Years of education: Not on file   Highest education level: Bachelor's degree (e.g., BA, AB, BS)  Occupational History   Occupation: staff account  Tobacco Use   Smoking status: Former    Current  packs/day: 0.00    Types: Cigarettes    Quit date: 10/15/2011    Years since quitting: 12.2   Smokeless tobacco: Never  Vaping Use   Vaping status: Former  Substance and Sexual Activity   Alcohol use: Yes    Comment: Very RARE   Drug use: No   Sexual activity: Not Currently    Birth control/protection: Surgical    Comment: ablation  Other Topics Concern   Not on file  Social History Narrative   Not on file   Social Drivers of Health   Tobacco Use: Medium Risk (01/06/2024)   Patient History    Smoking Tobacco Use: Former    Smokeless Tobacco Use: Never    Passive Exposure: Not on file  Financial Resource Strain: Patient Declined (11/25/2023)   Overall Financial Resource Strain (CARDIA)    Difficulty of Paying Living Expenses: Patient declined  Food Insecurity: Patient Declined (11/25/2023)   Epic    Worried About Programme Researcher, Broadcasting/film/video in the Last Year: Patient declined    Barista in the Last Year: Patient declined  Transportation Needs: No Transportation Needs (11/25/2023)   Epic    Lack of Transportation (Medical): No    Lack of Transportation (Non-Medical): No  Physical Activity: Unknown (11/25/2023)   Exercise Vital Sign  Days of Exercise per Week: Patient declined    Minutes of Exercise per Session: Not on file  Stress: Patient Declined (11/25/2023)   [employer] of Occupational Health - Occupational Stress Questionnaire    Feeling of Stress: Patient declined  Social Connections: Unknown (11/25/2023)   Social Connection and Isolation Panel    Frequency of Communication with Friends and Family: Patient declined    Frequency of Social Gatherings with Friends and Family: Patient declined    Attends Religious Services: Patient declined    Active Member of Clubs or Organizations: Patient declined    Attends Banker Meetings: Not on file    Marital Status: Patient declined  Intimate Partner Violence: Not At Risk (03/30/2023)   Humiliation, Afraid,  Rape, and Kick questionnaire    Fear of Current or Ex-Partner: No    Emotionally Abused: No    Physically Abused: No    Sexually Abused: No  Depression (PHQ2-9): Low Risk (11/25/2023)   Depression (PHQ2-9)    PHQ-2 Score: 2  Alcohol Screen: Low Risk (11/25/2023)   Alcohol Screen    Last Alcohol Screening Score (AUDIT): 1  Housing: Low Risk (11/25/2023)   Epic    Unable to Pay for Housing in the Last Year: No    Number of Times Moved in the Last Year: 0    Homeless in the Last Year: No  Utilities: Not At Risk (03/30/2023)   [facility] Utilities    Threatened with loss of utilities: No  Health Literacy: Not on file   Past Surgical History:  Procedure Laterality Date   BONE EXCISION Left 10/30/2015   Procedure: EXCISION OF LEFT CALCANEOUS BONE SPUR AT ANTERIOR PROCESS;  Surgeon: Norleen Armor, MD;  Location: [facility];  Service: Orthopedics;  Laterality: Left;   GALLBLADDER SURGERY  07/30/2022   LASER ABLATION/CAUTERIZATION OF ENDOMETRIAL IMPLANTS     due to heavy bleeding   WISDOM TOOTH EXTRACTION     Family History  Problem Relation Age of Onset   Hypertension Mother    Colon polyps Father 33   Depression Father    Hypertension Father    Depression Brother    Learning disabilities Brother    Cancer Maternal Aunt    Diabetes Paternal Uncle    Early death Maternal Grandfather    Heart disease Maternal Grandfather    Cancer Paternal Grandmother    Depression Paternal Grandmother    Diabetes Paternal Grandmother    Heart disease Paternal Grandfather    Colon cancer Neg Hx    Esophageal cancer Neg Hx    Prostate cancer Neg Hx    Rectal cancer Neg Hx    Stomach cancer Neg Hx    Sleep apnea Neg Hx    Current Medications[1]  Allergies[2]   ROS: Review of Systems Pertinent items noted in HPI and remainder of comprehensive ROS otherwise negative.    Physical exam BP 116/74   Pulse 80   Temp 98 F (36.7 C)   Ht 5' 7 (1.702 m)   Wt 205 lb 6 oz (93.2 kg)    SpO2 96%   BMI 32.17 kg/m  General appearance: alert, cooperative, appears stated age, no distress, and moderately obese Head: Normocephalic, without obvious abnormality, atraumatic Eyes: negative findings: lids and lashes normal, conjunctivae and sclerae normal, corneas clear, and pupils equal, round, reactive to light and accomodation Ears: normal TM's and external ear canals both ears Nose: Nares normal. Septum midline. Mucosa normal. No drainage or sinus tenderness. Throat:  lips, mucosa, and tongue normal; teeth and gums normal Neck: no adenopathy, no carotid bruit, supple, symmetrical, trachea midline, and thyroid  not enlarged, symmetric, no tenderness/mass/nodules Back: symmetric, no curvature. ROM normal. No CVA tenderness. Lungs: clear to auscultation bilaterally Breasts: Macromastia present.  Breasts are pendulous.  No axillary lymphadenopathy Heart: regular rate and rhythm, S1, S2 normal, no murmur, click, rub or gallop Abdomen: soft, non-tender; bowel sounds normal; no masses,  no organomegaly Extremities: extremities normal, atraumatic, no cyanosis or edema Pulses: 2+ and symmetric Skin: Skin color, texture, turgor normal. No rashes or lesions Lymph nodes: No supraclavicular, anterior cervical lymph node or axillary lymph node enlargement Neurologic: Alert and oriented X 3, normal strength and tone. Normal symmetric reflexes. Normal coordination and gait      01/06/2024    4:26 PM 11/25/2023    3:15 PM 10/12/2023    4:01 PM  Depression screen PHQ 2/9  Decreased Interest 0 1 0  Down, Depressed, Hopeless 0 0 0  PHQ - 2 Score 0 1 0  Altered sleeping 2 0 1  Tired, decreased energy 1 1 1   Change in appetite 0 0 0  Feeling bad or failure about yourself  0 0 0  Trouble concentrating 1 0 0  Moving slowly or fidgety/restless 0 0 0  Suicidal thoughts 0 0 0  PHQ-9 Score 4 2 2    Difficult doing work/chores Not difficult at all Not difficult at all Not difficult at all     Data  saved with a previous flowsheet row definition      01/06/2024    4:27 PM 11/25/2023    3:16 PM 10/12/2023    4:01 PM 04/11/2023    3:35 PM  GAD 7 : Generalized Anxiety Score  Nervous, Anxious, on Edge 0 0 1 0  Control/stop worrying 0 0 0 0  Worry too much - different things 0 0 0 0  Trouble relaxing 0 1 1 0  Restless 0 0 0 0  Easily annoyed or irritable 1 1 1  0  Afraid - awful might happen 0 0 0 0  Total GAD 7 Score 1 2 3  0  Anxiety Difficulty Not difficult at all Not difficult at all Not difficult at all Not difficult at all     Assessment/ Plan: Rayfield Dollar here for annual physical exam.   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  Physical exam unremarkable.  Singulair  added for rhinosinusitis as her symptoms of not currently controlled by Flonase  or OTC antihistamine  Nonfasting labs collected.  Check vitamin D  given history of insufficiency.  Vitamin D  renewed  Mood is stable with  current management.  No changes needed at this time  Counseled on healthy lifestyle choices, including diet (rich in fruits, vegetables and lean meats and low in salt and simple carbohydrates) and exercise (at least 30 minutes of moderate physical activity daily).  Patient to follow up 1 year for CPE  Tomie Spizzirri M. Detrich Rakestraw, DO        [1]  Current Outpatient Medications:    acetaminophen -codeine  (TYLENOL  #3) 300-30 MG tablet, Take 1 tablet by mouth every 6 (six) hours as needed for moderate pain (pain score 4-6)., Disp: 20 tablet, Rfl: 0   AIMOVIG  70 MG/ML SOAJ, Inject sub-q once monthly., Disp: 3 mL, Rfl: 3   buPROPion  (WELLBUTRIN  XL) 300 MG 24 hr tablet, Take 1 tablet (300 mg total) by mouth daily. Replacing 150, Disp: 90 tablet, Rfl: 3   cetirizine  (ZYRTEC ) 10 MG tablet, Take 10 mg by mouth daily., Disp: , Rfl:    escitalopram  (LEXAPRO ) 20 MG tablet, Take 1 tablet (20 mg total) by mouth daily., Disp: 90 tablet, Rfl: 3   fluticasone  (FLONASE ) 50 MCG/ACT nasal spray, Place 2 sprays into both nostrils daily., Disp: 16 g, Rfl: 3   losartan -hydrochlorothiazide  (HYZAAR) 50-12.5 MG tablet, Take 1 tablet by mouth daily. For blood pressure. To replace HCTZ, Disp: 90 tablet, Rfl: 0   Ubrogepant  (UBRELVY ) 100 MG TABS, Take 1 tablet (100 mg total) by mouth daily as needed (migraine headache)., Disp: 10 tablet, Rfl: PRN   Vitamin D , Ergocalciferol , (DRISDOL ) 1.25 MG (50000 UNIT) CAPS capsule, Take 1 capsule (50,000 Units total) by mouth once a week., Disp: 12 capsule, Rfl: 3 [2]  Allergies Allergen Reactions   Sulfasalazine Hives   Amoxicillin Hives   Erythromycin Hives   Keflex [Cephalexin] Hives   Nsaids     Has gastric ulcer   Penicillins Hives   Sulfa Antibiotics Hives   Tolmetin     Has gastric ulcer   "

## 2024-01-09 ENCOUNTER — Ambulatory Visit: Payer: Self-pay | Admitting: Family Medicine

## 2024-01-09 ENCOUNTER — Telehealth: Payer: Self-pay | Admitting: Family Medicine

## 2024-01-09 LAB — CBC WITH DIFFERENTIAL/PLATELET
Basophils Absolute: 0.1 x10E3/uL (ref 0.0–0.2)
Basos: 1 %
EOS (ABSOLUTE): 0.2 x10E3/uL (ref 0.0–0.4)
Eos: 2 %
Hematocrit: 41.3 % (ref 34.0–46.6)
Hemoglobin: 13.5 g/dL (ref 11.1–15.9)
Immature Grans (Abs): 0 x10E3/uL (ref 0.0–0.1)
Immature Granulocytes: 0 %
Lymphocytes Absolute: 2.1 x10E3/uL (ref 0.7–3.1)
Lymphs: 29 %
MCH: 27.2 pg (ref 26.6–33.0)
MCHC: 32.7 g/dL (ref 31.5–35.7)
MCV: 83 fL (ref 79–97)
Monocytes Absolute: 0.6 x10E3/uL (ref 0.1–0.9)
Monocytes: 8 %
Neutrophils Absolute: 4.3 x10E3/uL (ref 1.4–7.0)
Neutrophils: 60 %
Platelets: 320 x10E3/uL (ref 150–450)
RBC: 4.96 x10E6/uL (ref 3.77–5.28)
RDW: 13.4 % (ref 11.7–15.4)
WBC: 7.2 x10E3/uL (ref 3.4–10.8)

## 2024-01-09 LAB — CMP14+EGFR
ALT: 16 IU/L (ref 0–32)
AST: 18 IU/L (ref 0–40)
Albumin: 4.7 g/dL (ref 3.9–4.9)
Alkaline Phosphatase: 94 IU/L (ref 41–116)
BUN/Creatinine Ratio: 16 (ref 9–23)
BUN: 14 mg/dL (ref 6–24)
Bilirubin Total: 0.2 mg/dL (ref 0.0–1.2)
CO2: 19 mmol/L — AB (ref 20–29)
Calcium: 10.3 mg/dL — AB (ref 8.7–10.2)
Chloride: 103 mmol/L (ref 96–106)
Creatinine, Ser: 0.89 mg/dL (ref 0.57–1.00)
Globulin, Total: 2.7 g/dL (ref 1.5–4.5)
Glucose: 97 mg/dL (ref 70–99)
Potassium: 4.1 mmol/L (ref 3.5–5.2)
Sodium: 142 mmol/L (ref 134–144)
Total Protein: 7.4 g/dL (ref 6.0–8.5)
eGFR: 79 mL/min/1.73

## 2024-01-09 LAB — LIPID PANEL
Cholesterol, Total: 166 mg/dL (ref 100–199)
HDL: 47 mg/dL
LDL CALC COMMENT:: 3.5 ratio (ref 0.0–4.4)
LDL Chol Calc (NIH): 91 mg/dL (ref 0–99)
Triglycerides: 159 mg/dL — AB (ref 0–149)
VLDL Cholesterol Cal: 28 mg/dL (ref 5–40)

## 2024-01-09 LAB — VITAMIN D 25 HYDROXY (VIT D DEFICIENCY, FRACTURES): Vit D, 25-Hydroxy: 51.3 ng/mL (ref 30.0–100.0)

## 2024-01-09 LAB — TSH: TSH: 0.767 u[IU]/mL (ref 0.450–4.500)

## 2024-01-09 NOTE — Telephone Encounter (Unsigned)
 Copied from CRM #[id number]. Topic: Referral - Question >> Jan 09, 2024  3:06 PM Mia F wrote: Reason for CRM: [facility] called stating that they received a referral for pt to have a mammogram/US  done but they are booked up until January (8th-9th). She want to inform office of when they are booking into. Please call to schedule if needed

## 2024-01-09 NOTE — Telephone Encounter (Signed)
 Pt aware and scheduled for 01/25/2024 at 1:00 PM.

## 2024-01-09 NOTE — Addendum Note (Signed)
 Addended by: JOLINDA NORENE HERO on: 01/09/2024 02:43 PM   Modules accepted: Orders

## 2024-01-09 NOTE — Telephone Encounter (Signed)
 Noted

## 2024-01-25 ENCOUNTER — Ambulatory Visit
Admission: RE | Admit: 2024-01-25 | Discharge: 2024-01-25 | Disposition: A | Discharge status: Home / Self Care | Source: Ambulatory Visit | Attending: Family Medicine | Admitting: Family Medicine

## 2024-01-25 ENCOUNTER — Other Ambulatory Visit

## 2024-01-25 DIAGNOSIS — N644 Mastodynia: Secondary | ICD-10-CM

## 2024-02-14 ENCOUNTER — Encounter (HOSPITAL_COMMUNITY)

## 2024-02-14 ENCOUNTER — Other Ambulatory Visit (HOSPITAL_COMMUNITY)

## 2024-02-21 ENCOUNTER — Other Ambulatory Visit (HOSPITAL_COMMUNITY): Payer: Self-pay

## 2024-02-21 ENCOUNTER — Telehealth: Payer: Self-pay

## 2024-02-21 NOTE — Telephone Encounter (Signed)
 Pharmacy Patient Advocate Encounter   Received notification from [facility] that prior authorization for Aimovig  70 is required/requested.   Insurance verification completed.   The patient is insured through [facility].   Per test claim: PA required; PA submitted to above mentioned insurance via Latent Key/confirmation #/EOC [id number] Status is pending

## 2024-04-06 ENCOUNTER — Ambulatory Visit: Admitting: Family Medicine

## 2025-01-07 ENCOUNTER — Encounter: Admitting: Family Medicine
